# Patient Record
Sex: Male | Born: 1950 | Race: White | Hispanic: No | Marital: Married | Smoking: Former smoker
Health system: Southern US, Community
[De-identification: ages and names within clinical notes are randomized; demographics above are authoritative.]

## PROBLEM LIST (undated history)

## (undated) DIAGNOSIS — I714 Abdominal aortic aneurysm, without rupture, unspecified: Secondary | ICD-10-CM

## (undated) DIAGNOSIS — I10 Essential (primary) hypertension: Secondary | ICD-10-CM

## (undated) DIAGNOSIS — E119 Type 2 diabetes mellitus without complications: Secondary | ICD-10-CM

## (undated) DIAGNOSIS — C4491 Basal cell carcinoma of skin, unspecified: Secondary | ICD-10-CM

## (undated) DIAGNOSIS — D473 Essential (hemorrhagic) thrombocythemia: Secondary | ICD-10-CM

## (undated) HISTORY — PX: CATARACT EXTRACTION, BILATERAL: SHX1313

## (undated) HISTORY — DX: Abdominal aortic aneurysm, without rupture, unspecified: I71.40

## (undated) HISTORY — DX: Essential (hemorrhagic) thrombocythemia: D47.3

## (undated) HISTORY — PX: TONSILLECTOMY: SUR1361

## (undated) HISTORY — DX: Type 2 diabetes mellitus without complications: E11.9

## (undated) HISTORY — DX: Essential (primary) hypertension: I10

---

## 1998-06-19 ENCOUNTER — Ambulatory Visit (HOSPITAL_COMMUNITY): Admission: RE | Admit: 1998-06-19 | Discharge: 1998-06-19 | Payer: Self-pay | Admitting: Cardiology

## 1998-07-05 ENCOUNTER — Ambulatory Visit (HOSPITAL_COMMUNITY): Admission: RE | Admit: 1998-07-05 | Discharge: 1998-07-05 | Payer: Self-pay | Admitting: Pulmonary Disease

## 2001-12-30 DIAGNOSIS — C189 Malignant neoplasm of colon, unspecified: Secondary | ICD-10-CM

## 2001-12-30 HISTORY — DX: Malignant neoplasm of colon, unspecified: C18.9

## 2002-06-23 HISTORY — PX: COLON SURGERY: SHX602

## 2012-06-03 ENCOUNTER — Encounter (INDEPENDENT_AMBULATORY_CARE_PROVIDER_SITE_OTHER): Payer: Self-pay | Admitting: Hematology and Oncology

## 2012-06-03 DIAGNOSIS — C189 Malignant neoplasm of colon, unspecified: Secondary | ICD-10-CM

## 2012-06-03 DIAGNOSIS — M549 Dorsalgia, unspecified: Secondary | ICD-10-CM

## 2012-06-03 DIAGNOSIS — D473 Essential (hemorrhagic) thrombocythemia: Secondary | ICD-10-CM

## 2012-06-03 DIAGNOSIS — E119 Type 2 diabetes mellitus without complications: Secondary | ICD-10-CM

## 2012-06-25 ENCOUNTER — Encounter (INDEPENDENT_AMBULATORY_CARE_PROVIDER_SITE_OTHER): Payer: Self-pay | Admitting: Hematology and Oncology

## 2012-06-25 DIAGNOSIS — C189 Malignant neoplasm of colon, unspecified: Secondary | ICD-10-CM

## 2012-06-25 DIAGNOSIS — D473 Essential (hemorrhagic) thrombocythemia: Secondary | ICD-10-CM

## 2012-06-25 DIAGNOSIS — E119 Type 2 diabetes mellitus without complications: Secondary | ICD-10-CM

## 2012-06-25 DIAGNOSIS — M129 Arthropathy, unspecified: Secondary | ICD-10-CM

## 2017-09-29 ENCOUNTER — Encounter: Payer: Self-pay | Admitting: Adult Health

## 2017-10-01 ENCOUNTER — Encounter: Payer: Self-pay | Admitting: Adult Health

## 2017-10-06 ENCOUNTER — Encounter: Payer: Self-pay | Admitting: Internal Medicine

## 2017-10-17 ENCOUNTER — Encounter: Payer: Self-pay | Admitting: Adult Health

## 2017-11-10 ENCOUNTER — Encounter: Payer: Self-pay | Admitting: Adult Health

## 2017-11-13 ENCOUNTER — Ambulatory Visit: Payer: Self-pay | Admitting: Nurse Practitioner

## 2017-11-19 ENCOUNTER — Encounter: Payer: Self-pay | Admitting: Adult Health

## 2017-12-10 ENCOUNTER — Encounter: Payer: Self-pay | Admitting: Adult Health

## 2018-01-01 ENCOUNTER — Ambulatory Visit: Payer: Self-pay | Admitting: Nurse Practitioner

## 2018-01-14 DIAGNOSIS — I10 Essential (primary) hypertension: Secondary | ICD-10-CM | POA: Diagnosis not present

## 2018-01-14 DIAGNOSIS — Z85038 Personal history of other malignant neoplasm of large intestine: Secondary | ICD-10-CM | POA: Diagnosis not present

## 2018-01-14 DIAGNOSIS — E119 Type 2 diabetes mellitus without complications: Secondary | ICD-10-CM | POA: Diagnosis not present

## 2018-01-14 DIAGNOSIS — R1013 Epigastric pain: Secondary | ICD-10-CM | POA: Diagnosis not present

## 2018-01-14 DIAGNOSIS — D473 Essential (hemorrhagic) thrombocythemia: Secondary | ICD-10-CM | POA: Diagnosis not present

## 2018-01-14 DIAGNOSIS — Z7982 Long term (current) use of aspirin: Secondary | ICD-10-CM | POA: Diagnosis not present

## 2018-01-14 DIAGNOSIS — M109 Gout, unspecified: Secondary | ICD-10-CM | POA: Diagnosis not present

## 2018-01-14 DIAGNOSIS — R5383 Other fatigue: Secondary | ICD-10-CM | POA: Diagnosis not present

## 2018-01-14 DIAGNOSIS — R110 Nausea: Secondary | ICD-10-CM | POA: Diagnosis not present

## 2018-01-14 DIAGNOSIS — Z7984 Long term (current) use of oral hypoglycemic drugs: Secondary | ICD-10-CM | POA: Diagnosis not present

## 2018-01-14 DIAGNOSIS — Z85820 Personal history of malignant melanoma of skin: Secondary | ICD-10-CM | POA: Diagnosis not present

## 2018-01-14 DIAGNOSIS — Z87891 Personal history of nicotine dependence: Secondary | ICD-10-CM | POA: Diagnosis not present

## 2018-01-14 DIAGNOSIS — Z86718 Personal history of other venous thrombosis and embolism: Secondary | ICD-10-CM | POA: Diagnosis not present

## 2018-01-14 DIAGNOSIS — Z9221 Personal history of antineoplastic chemotherapy: Secondary | ICD-10-CM | POA: Diagnosis not present

## 2018-01-14 DIAGNOSIS — Z79899 Other long term (current) drug therapy: Secondary | ICD-10-CM | POA: Diagnosis not present

## 2018-01-14 DIAGNOSIS — T433X5A Adverse effect of phenothiazine antipsychotics and neuroleptics, initial encounter: Secondary | ICD-10-CM | POA: Diagnosis not present

## 2018-01-14 DIAGNOSIS — Z79891 Long term (current) use of opiate analgesic: Secondary | ICD-10-CM | POA: Diagnosis not present

## 2018-01-14 DIAGNOSIS — E875 Hyperkalemia: Secondary | ICD-10-CM | POA: Diagnosis not present

## 2018-01-27 DIAGNOSIS — H524 Presbyopia: Secondary | ICD-10-CM | POA: Diagnosis not present

## 2018-01-27 DIAGNOSIS — H40013 Open angle with borderline findings, low risk, bilateral: Secondary | ICD-10-CM | POA: Diagnosis not present

## 2018-01-27 DIAGNOSIS — H2513 Age-related nuclear cataract, bilateral: Secondary | ICD-10-CM | POA: Diagnosis not present

## 2018-01-28 DIAGNOSIS — E119 Type 2 diabetes mellitus without complications: Secondary | ICD-10-CM | POA: Diagnosis not present

## 2018-01-28 DIAGNOSIS — R110 Nausea: Secondary | ICD-10-CM | POA: Diagnosis not present

## 2018-01-28 DIAGNOSIS — T433X5A Adverse effect of phenothiazine antipsychotics and neuroleptics, initial encounter: Secondary | ICD-10-CM | POA: Diagnosis not present

## 2018-01-28 DIAGNOSIS — I10 Essential (primary) hypertension: Secondary | ICD-10-CM | POA: Diagnosis not present

## 2018-01-28 DIAGNOSIS — R1013 Epigastric pain: Secondary | ICD-10-CM | POA: Diagnosis not present

## 2018-01-28 DIAGNOSIS — D473 Essential (hemorrhagic) thrombocythemia: Secondary | ICD-10-CM | POA: Diagnosis not present

## 2018-02-25 DIAGNOSIS — Z85038 Personal history of other malignant neoplasm of large intestine: Secondary | ICD-10-CM | POA: Diagnosis not present

## 2018-02-25 DIAGNOSIS — Z7984 Long term (current) use of oral hypoglycemic drugs: Secondary | ICD-10-CM | POA: Diagnosis not present

## 2018-02-25 DIAGNOSIS — M109 Gout, unspecified: Secondary | ICD-10-CM | POA: Diagnosis not present

## 2018-02-25 DIAGNOSIS — Z86718 Personal history of other venous thrombosis and embolism: Secondary | ICD-10-CM | POA: Diagnosis not present

## 2018-02-25 DIAGNOSIS — Z9221 Personal history of antineoplastic chemotherapy: Secondary | ICD-10-CM | POA: Diagnosis not present

## 2018-02-25 DIAGNOSIS — Z91041 Radiographic dye allergy status: Secondary | ICD-10-CM | POA: Diagnosis not present

## 2018-02-25 DIAGNOSIS — Z79899 Other long term (current) drug therapy: Secondary | ICD-10-CM | POA: Diagnosis not present

## 2018-02-25 DIAGNOSIS — E119 Type 2 diabetes mellitus without complications: Secondary | ICD-10-CM | POA: Diagnosis not present

## 2018-02-25 DIAGNOSIS — D473 Essential (hemorrhagic) thrombocythemia: Secondary | ICD-10-CM | POA: Diagnosis not present

## 2018-02-25 DIAGNOSIS — E785 Hyperlipidemia, unspecified: Secondary | ICD-10-CM | POA: Diagnosis not present

## 2018-02-25 DIAGNOSIS — Z7982 Long term (current) use of aspirin: Secondary | ICD-10-CM | POA: Diagnosis not present

## 2018-02-25 DIAGNOSIS — I10 Essential (primary) hypertension: Secondary | ICD-10-CM | POA: Diagnosis not present

## 2018-02-25 DIAGNOSIS — Z79891 Long term (current) use of opiate analgesic: Secondary | ICD-10-CM | POA: Diagnosis not present

## 2018-02-26 DIAGNOSIS — H40013 Open angle with borderline findings, low risk, bilateral: Secondary | ICD-10-CM | POA: Diagnosis not present

## 2018-03-05 ENCOUNTER — Other Ambulatory Visit (HOSPITAL_COMMUNITY): Payer: Self-pay | Admitting: *Deleted

## 2018-03-05 DIAGNOSIS — D473 Essential (hemorrhagic) thrombocythemia: Secondary | ICD-10-CM

## 2018-03-10 DIAGNOSIS — M545 Low back pain: Secondary | ICD-10-CM | POA: Diagnosis not present

## 2018-03-10 DIAGNOSIS — E782 Mixed hyperlipidemia: Secondary | ICD-10-CM | POA: Diagnosis not present

## 2018-03-10 DIAGNOSIS — E119 Type 2 diabetes mellitus without complications: Secondary | ICD-10-CM | POA: Diagnosis not present

## 2018-03-10 DIAGNOSIS — N189 Chronic kidney disease, unspecified: Secondary | ICD-10-CM | POA: Diagnosis not present

## 2018-03-10 DIAGNOSIS — D509 Iron deficiency anemia, unspecified: Secondary | ICD-10-CM | POA: Diagnosis not present

## 2018-03-10 DIAGNOSIS — R161 Splenomegaly, not elsewhere classified: Secondary | ICD-10-CM | POA: Diagnosis not present

## 2018-03-10 DIAGNOSIS — I10 Essential (primary) hypertension: Secondary | ICD-10-CM | POA: Diagnosis not present

## 2018-03-10 DIAGNOSIS — D519 Vitamin B12 deficiency anemia, unspecified: Secondary | ICD-10-CM | POA: Diagnosis not present

## 2018-03-10 DIAGNOSIS — R739 Hyperglycemia, unspecified: Secondary | ICD-10-CM | POA: Diagnosis not present

## 2018-03-10 DIAGNOSIS — E7800 Pure hypercholesterolemia, unspecified: Secondary | ICD-10-CM | POA: Diagnosis not present

## 2018-03-10 DIAGNOSIS — M109 Gout, unspecified: Secondary | ICD-10-CM | POA: Diagnosis not present

## 2018-03-10 DIAGNOSIS — E559 Vitamin D deficiency, unspecified: Secondary | ICD-10-CM | POA: Diagnosis not present

## 2018-03-10 DIAGNOSIS — D529 Folate deficiency anemia, unspecified: Secondary | ICD-10-CM | POA: Diagnosis not present

## 2018-03-12 DIAGNOSIS — E1142 Type 2 diabetes mellitus with diabetic polyneuropathy: Secondary | ICD-10-CM | POA: Diagnosis not present

## 2018-03-12 DIAGNOSIS — Z6838 Body mass index (BMI) 38.0-38.9, adult: Secondary | ICD-10-CM | POA: Diagnosis not present

## 2018-03-12 DIAGNOSIS — G6289 Other specified polyneuropathies: Secondary | ICD-10-CM | POA: Diagnosis not present

## 2018-03-12 DIAGNOSIS — E782 Mixed hyperlipidemia: Secondary | ICD-10-CM | POA: Diagnosis not present

## 2018-03-12 DIAGNOSIS — D473 Essential (hemorrhagic) thrombocythemia: Secondary | ICD-10-CM | POA: Diagnosis not present

## 2018-03-12 DIAGNOSIS — I10 Essential (primary) hypertension: Secondary | ICD-10-CM | POA: Diagnosis not present

## 2018-04-08 ENCOUNTER — Encounter (HOSPITAL_COMMUNITY): Payer: Self-pay | Admitting: Hematology

## 2018-04-08 ENCOUNTER — Inpatient Hospital Stay (HOSPITAL_COMMUNITY): Payer: Medicare Other | Attending: Hematology | Admitting: Hematology

## 2018-04-08 ENCOUNTER — Inpatient Hospital Stay (HOSPITAL_COMMUNITY): Payer: Medicare Other | Attending: Hematology

## 2018-04-08 ENCOUNTER — Other Ambulatory Visit: Payer: Self-pay

## 2018-04-08 DIAGNOSIS — Z801 Family history of malignant neoplasm of trachea, bronchus and lung: Secondary | ICD-10-CM

## 2018-04-08 DIAGNOSIS — D473 Essential (hemorrhagic) thrombocythemia: Secondary | ICD-10-CM | POA: Diagnosis not present

## 2018-04-08 DIAGNOSIS — Z7984 Long term (current) use of oral hypoglycemic drugs: Secondary | ICD-10-CM | POA: Insufficient documentation

## 2018-04-08 DIAGNOSIS — Z7982 Long term (current) use of aspirin: Secondary | ICD-10-CM | POA: Insufficient documentation

## 2018-04-08 DIAGNOSIS — I10 Essential (primary) hypertension: Secondary | ICD-10-CM

## 2018-04-08 DIAGNOSIS — Z85038 Personal history of other malignant neoplasm of large intestine: Secondary | ICD-10-CM | POA: Diagnosis not present

## 2018-04-08 DIAGNOSIS — E119 Type 2 diabetes mellitus without complications: Secondary | ICD-10-CM

## 2018-04-08 DIAGNOSIS — Z86718 Personal history of other venous thrombosis and embolism: Secondary | ICD-10-CM | POA: Diagnosis not present

## 2018-04-08 LAB — CBC WITH DIFFERENTIAL/PLATELET
BASOS ABS: 0.1 10*3/uL (ref 0.0–0.1)
BASOS PCT: 1 %
EOS PCT: 1 %
Eosinophils Absolute: 0.1 10*3/uL (ref 0.0–0.7)
HEMATOCRIT: 38.1 % — AB (ref 39.0–52.0)
HEMOGLOBIN: 12.5 g/dL — AB (ref 13.0–17.0)
Lymphocytes Relative: 25 %
Lymphs Abs: 1.2 10*3/uL (ref 0.7–4.0)
MCH: 32.8 pg (ref 26.0–34.0)
MCHC: 32.8 g/dL (ref 30.0–36.0)
MCV: 100 fL (ref 78.0–100.0)
MONO ABS: 0.4 10*3/uL (ref 0.1–1.0)
MONOS PCT: 8 %
Neutro Abs: 3.1 10*3/uL (ref 1.7–7.7)
Neutrophils Relative %: 65 %
Platelets: 359 10*3/uL (ref 150–400)
RBC: 3.81 MIL/uL — ABNORMAL LOW (ref 4.22–5.81)
RDW: 19 % — ABNORMAL HIGH (ref 11.5–15.5)
WBC: 4.8 10*3/uL (ref 4.0–10.5)

## 2018-04-08 LAB — COMPREHENSIVE METABOLIC PANEL
ALT: 35 U/L (ref 17–63)
AST: 22 U/L (ref 15–41)
Albumin: 4.6 g/dL (ref 3.5–5.0)
Alkaline Phosphatase: 38 U/L (ref 38–126)
Anion gap: 12 (ref 5–15)
BUN: 16 mg/dL (ref 6–20)
CHLORIDE: 104 mmol/L (ref 101–111)
CO2: 21 mmol/L — AB (ref 22–32)
CREATININE: 1.21 mg/dL (ref 0.61–1.24)
Calcium: 9.4 mg/dL (ref 8.9–10.3)
GFR calc Af Amer: >60 mL/min (ref >60)
GFR, EST NON AFRICAN AMERICAN: 60 mL/min — AB (ref >60)
Glucose, Bld: 148 mg/dL — ABNORMAL HIGH (ref 65–99)
Potassium: 3.9 mmol/L (ref 3.5–5.1)
Sodium: 137 mmol/L (ref 135–145)
Total Bilirubin: 0.9 mg/dL (ref 0.3–1.2)
Total Protein: 7.1 g/dL (ref 6.5–8.1)

## 2018-04-08 NOTE — Assessment & Plan Note (Signed)
1.  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  He does not have any vasomotor symptoms.  We started him on hydroxyurea in December 2018.  He is also taking baby aspirin daily.  He is complaining of occasional lightheadedness since we started hydroxyurea.  His platelet count today has come down to 359.  His white count and hemoglobin are stable.  Hence I have recommended cutting down hydroxyurea to 2 tablets daily.  He was taking 3 tablets on Monday and Friday and 2 tablets rest of the week.  I will see him back in 2 months for follow-up and repeat labs.  He does not report any left upper quadrant pain since we started hydroxyurea.  2.  Diabetes: His Metformin ER has been increased to 1000mg  daily.  He is having slight diarrhea from it.  3.  Hypertension: This is well controlled on Toprol-XL.

## 2018-04-08 NOTE — Progress Notes (Signed)
CONSULT NOTE  Patient Care Team: Caryl Bis, MD as PCP - General (Family Medicine) Gala Romney Cristopher Estimable, MD as Consulting Physician (Gastroenterology)  CHIEF COMPLAINTS/PURPOSE OF CONSULTATION:  Essential thrombocytosis.  HISTORY OF PRESENTING ILLNESS:  Justin Berger 67 y.o. male is seen in consultation today for management of essential thrombocytosis.  He was having vague left upper quadrant abdominal pain for which a CT scan in October 2018 showed splenomegaly measuring 15 cm in craniocaudal dimension.  He was referred for further evaluation of splenomegaly.  On CBC he was found to have elevated platelet count of 576.  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  He had a bone marrow biopsy done on 12/02/2017.  He had a history of left leg DVT in the past, but did not have any vasomotor symptoms.  He was started on hydroxyurea 1 tablet daily on 12/12/2017 and increased to 2 tablets daily on 12/25/2017.  He is currently taking 3 tablets on Monday and Friday and 2 tablets the rest of the week.  He complains of fatigue on the days when he takes 3 of them.  He also has occasional lightheadedness.  The lightheadedness has been present since he started taking hydroxyurea.  He denies any fevers, night sweats or weight loss.  He denies any hospitalizations or recent infections.  He denies any mucositis.  He denies any nausea associated with it.  He also has had a history of colon cancer, status post resection in September 2012, status post adjuvant chemotherapy with 6 months of 5-FU and leucovorin under the direction of Dr. Sonny Dandy at [place]:  Gout, diabetes, hypertension, high cholesterol, colon cancer as described above, DVT, melanoma SURGICAL HISTORY: History reviewed. No pertinent surgical history.  SOCIAL HISTORY: Social History   Socioeconomic History  . Marital status: Married    Spouse name: Not on file  . Number of children: Not on file   . Years of education: Not on file  . Highest education level: Not on file  Occupational History  . Not on file  Social Needs  . Financial resource strain: Not on file  . Food insecurity:    Worry: Not on file    Inability: Not on file  . Transportation needs:    Medical: Not on file    Non-medical: Not on file  Tobacco Use  . Smoking status: Former Smoker    Packs/day: 0.50    Years: 3.00    Pack years: 1.50    Types: Cigarettes    Last attempt to quit: 04/08/1978    Years since quitting: 40.0  . Smokeless tobacco: Never Used  Substance and Sexual Activity  . Alcohol use: Never    Frequency: Never  . Drug use: Never  . Sexual activity: Not on file  Lifestyle  . Physical activity:    Days per week: Not on file    Minutes per session: Not on file  . Stress: Not on file  Relationships  . Social connections:    Talks on phone: Not on file    Gets together: Not on file    Attends religious service: Not on file    Active member of club or organization: Not on file    Attends meetings of clubs or organizations: Not on file    Relationship status: Not on file  . Intimate partner violence:    Fear of current or ex partner: Not on file    Emotionally abused: Not on  file    Physically abused: Not on file    Forced sexual activity: Not on file  Other Topics Concern  . Not on file  Social History Narrative  . Not on file    FAMILY HISTORY: Family History  Problem Relation Age of Onset  . Dementia Mother   . Cancer Paternal Uncle        lung    ALLERGIES:  is allergic to ivp dye  [iodinated diagnostic agents].  MEDICATIONS:  Current Outpatient Medications  Medication Sig Dispense Refill  . ACCU-CHEK AVIVA PLUS test strip TEST BLOOD SUGAR D  3  . cyclobenzaprine (FLEXERIL) 10 MG tablet Take by mouth.    . hydroxyurea (HYDREA) 500 MG capsule TK 2 CS PO D  3  . metFORMIN (GLUCOPHAGE-XR) 500 MG 24 hr tablet TK 1 T PO QD  6  . metoprolol succinate (TOPROL-XL) 50 MG 24  hr tablet TK 1 T PO QD  3  . promethazine (PHENERGAN) 12.5 MG tablet TAKE 1-2 TABLETS PO BID PRN  0   No current facility-administered medications for this visit.     REVIEW OF SYSTEMS:   Constitutional: Denies fevers, chills or abnormal night sweats.  Occasional fatigue. Eyes: Denies blurriness of vision, double vision or watery eyes Ears, nose, mouth, throat, and face: Denies mucositis or sore throat Respiratory: Denies cough, dyspnea or wheezes Cardiovascular: Denies palpitation, chest discomfort or lower extremity swelling.  Occasional lightheadedness. Gastrointestinal:  Denies nausea, heartburn or change in bowel habits Skin: Denies abnormal skin rashes Lymphatics: Denies new lymphadenopathy or easy bruising Neurological:Denies numbness, tingling or new weaknesses Behavioral/Psych: Mood is stable, no new changes  All other systems were reviewed with the patient and are negative.  PHYSICAL EXAMINATION: ECOG PERFORMANCE STATUS: 1 - Symptomatic but completely ambulatory  Vitals:   04/08/18 1312  BP: (!) 155/83  Pulse: 71  Resp: 16  SpO2: 98%   Filed Weights   04/08/18 1312  Weight: 259 lb (117.5 kg)    GENERAL:alert, no distress and comfortable SKIN: skin color, texture, turgor are normal, no rashes or significant lesions EYES: normal, conjunctiva are pink and non-injected, sclera clear OROPHARYNX:no exudate, no erythema and lips, buccal mucosa, and tongue normal  NECK: supple, thyroid normal size, non-tender, without nodularity LYMPH:  no palpable lymphadenopathy in the cervical, axillary or inguinal LUNGS: clear to auscultation and percussion with normal breathing effort HEART: regular rate & rhythm and no murmurs and no lower extremity edema ABDOMEN:abdomen soft, non-tender and normal bowel sounds Musculoskeletal:no cyanosis of digits and no clubbing  PSYCH: alert & oriented x 3 with fluent speech NEURO: no focal motor/sensory deficits  LABORATORY DATA:  I have  reviewed the data as listed Recent Results (from the past 2160 hour(s))  CBC with Differential     Status: Abnormal   Collection Time: 04/08/18 12:15 PM  Result Value Ref Range   WBC 4.8 4.0 - 10.5 K/uL   RBC 3.81 (L) 4.22 - 5.81 MIL/uL   Hemoglobin 12.5 (L) 13.0 - 17.0 g/dL   HCT 38.1 (L) 39.0 - 52.0 %   MCV 100.0 78.0 - 100.0 fL   MCH 32.8 26.0 - 34.0 pg   MCHC 32.8 30.0 - 36.0 g/dL   RDW 19.0 (H) 11.5 - 15.5 %   Platelets 359 150 - 400 K/uL   Neutrophils Relative % 65 %   Neutro Abs 3.1 1.7 - 7.7 K/uL   Lymphocytes Relative 25 %   Lymphs Abs 1.2 0.7 - 4.0 K/uL  Monocytes Relative 8 %   Monocytes Absolute 0.4 0.1 - 1.0 K/uL   Eosinophils Relative 1 %   Eosinophils Absolute 0.1 0.0 - 0.7 K/uL   Basophils Relative 1 %   Basophils Absolute 0.1 0.0 - 0.1 K/uL   WBC Morphology ATYPICAL LYMPHOCYTES     Comment: Performed at [facility], [address], [place], [place] [address]  Comprehensive metabolic panel     Status: Abnormal   Collection Time: 04/08/18 12:15 PM  Result Value Ref Range   Sodium 137 135 - 145 mmol/L   Potassium 3.9 3.5 - 5.1 mmol/L   Chloride 104 101 - 111 mmol/L   CO2 21 (L) 22 - 32 mmol/L   Glucose, Bld 148 (H) 65 - 99 mg/dL   BUN 16 6 - 20 mg/dL   Creatinine, Ser 1.21 0.61 - 1.24 mg/dL   Calcium 9.4 8.9 - 10.3 mg/dL   Total Protein 7.1 6.5 - 8.1 g/dL   Albumin 4.6 3.5 - 5.0 g/dL   AST 22 15 - 41 U/L   ALT 35 17 - 63 U/L   Alkaline Phosphatase 38 38 - 126 U/L   Total Bilirubin 0.9 0.3 - 1.2 mg/dL   GFR calc non Af Amer 60 (L) >60 mL/min   GFR calc Af Amer >60 >60 mL/min    Comment: (NOTE) The eGFR has been calculated using the CKD EPI equation. This calculation has not been validated in all clinical situations. eGFR's persistently <60 mL/min signify possible Chronic Kidney Disease.    Anion gap 12 5 - 15    Comment: Performed at [facility], [address], [place], [place] [address]     ASSESSMENT & PLAN:  Essential thrombocytosis ([place]) 1.   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  He does not have any vasomotor symptoms.  We started him on hydroxyurea in December 2018.  He is also taking baby aspirin daily.  He is complaining of occasional lightheadedness since we started hydroxyurea.  His platelet count today has come down to 359.  His white count and hemoglobin are stable.  Hence I have recommended cutting down hydroxyurea to 2 tablets daily.  He was taking 3 tablets on Monday and Friday and 2 tablets rest of the week.  I will see him back in 2 months for follow-up and repeat labs.  He does not report any left upper quadrant pain since we started hydroxyurea.  2.  Diabetes: His Metformin ER has been increased to 1092m daily.  He is having slight diarrhea from it.  3.  Hypertension: This is well controlled on Toprol-XL.       SDerek Jack MD 04/08/18 1:37 PM

## 2018-06-09 ENCOUNTER — Encounter (HOSPITAL_COMMUNITY): Payer: Self-pay | Admitting: Hematology

## 2018-06-09 ENCOUNTER — Other Ambulatory Visit: Payer: Self-pay

## 2018-06-09 ENCOUNTER — Inpatient Hospital Stay (HOSPITAL_COMMUNITY): Payer: Medicare Other

## 2018-06-09 ENCOUNTER — Inpatient Hospital Stay (HOSPITAL_COMMUNITY): Payer: Medicare Other | Attending: Hematology | Admitting: Hematology

## 2018-06-09 DIAGNOSIS — I10 Essential (primary) hypertension: Secondary | ICD-10-CM

## 2018-06-09 DIAGNOSIS — D473 Essential (hemorrhagic) thrombocythemia: Secondary | ICD-10-CM

## 2018-06-09 DIAGNOSIS — E119 Type 2 diabetes mellitus without complications: Secondary | ICD-10-CM

## 2018-06-09 DIAGNOSIS — Z87891 Personal history of nicotine dependence: Secondary | ICD-10-CM | POA: Insufficient documentation

## 2018-06-09 DIAGNOSIS — L819 Disorder of pigmentation, unspecified: Secondary | ICD-10-CM | POA: Diagnosis not present

## 2018-06-09 LAB — COMPREHENSIVE METABOLIC PANEL
ALT: 30 U/L (ref 17–63)
AST: 21 U/L (ref 15–41)
Albumin: 4.7 g/dL (ref 3.5–5.0)
Alkaline Phosphatase: 38 U/L (ref 38–126)
Anion gap: 10 (ref 5–15)
BUN: 18 mg/dL (ref 6–20)
CHLORIDE: 106 mmol/L (ref 101–111)
CO2: 24 mmol/L (ref 22–32)
CREATININE: 1.34 mg/dL — AB (ref 0.61–1.24)
Calcium: 9.4 mg/dL (ref 8.9–10.3)
GFR calc non Af Amer: 53 mL/min — ABNORMAL LOW (ref >60)
Glucose, Bld: 128 mg/dL — ABNORMAL HIGH (ref 65–99)
Potassium: 3.8 mmol/L (ref 3.5–5.1)
Sodium: 140 mmol/L (ref 135–145)
Total Bilirubin: 1.1 mg/dL (ref 0.3–1.2)
Total Protein: 7.2 g/dL (ref 6.5–8.1)

## 2018-06-09 LAB — CBC WITH DIFFERENTIAL/PLATELET
BASOS PCT: 2 %
Basophils Absolute: 0.1 10*3/uL (ref 0.0–0.1)
EOS PCT: 1 %
Eosinophils Absolute: 0.1 10*3/uL (ref 0.0–0.7)
HCT: 40.3 % (ref 39.0–52.0)
Hemoglobin: 12.8 g/dL — ABNORMAL LOW (ref 13.0–17.0)
LYMPHS ABS: 1.4 10*3/uL (ref 0.7–4.0)
Lymphocytes Relative: 26 %
MCH: 32.7 pg (ref 26.0–34.0)
MCHC: 31.8 g/dL (ref 30.0–36.0)
MCV: 103.1 fL — AB (ref 78.0–100.0)
MONO ABS: 0.4 10*3/uL (ref 0.1–1.0)
Monocytes Relative: 8 %
NEUTROS ABS: 3.2 10*3/uL (ref 1.7–7.7)
Neutrophils Relative %: 63 %
PLATELETS: 376 10*3/uL (ref 150–400)
RBC: 3.91 MIL/uL — ABNORMAL LOW (ref 4.22–5.81)
RDW: 16.1 % — AB (ref 11.5–15.5)
WBC: 5.2 10*3/uL (ref 4.0–10.5)

## 2018-06-09 LAB — LACTATE DEHYDROGENASE: LDH: 180 U/L (ref 98–192)

## 2018-06-09 NOTE — Progress Notes (Signed)
[place], [place] [address]   CLINIC:  Medical Oncology/Hematology  PCP:  Caryl Bis, MD [place] [address] [phone]   REASON FOR VISIT:  Follow-up for essential thrombocytosis.  CURRENT THERAPY: Hydroxyurea 2 tablets daily.  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   INTERVAL HISTORY:  Justin Berger 67 y.o. male returns for follow-up of essential thrombocytosis.  At last visit 2 months ago, we have cut back on dose of hydroxyurea to 2 tablets daily as he was complaining of feeling lightheaded.  Today he reports that it is not lightheadedness but he feels off balance when he walks.  It does not happen often.  It did not get better after we cut back on hydroxyurea dose.  His blood pressure was 741 systolic today.  He denies any mucositis.  He reports a hyperpigmented area at the right corner of the mouth which has been there for more than 6  months.  It does not bleed or itch.  He is concerned about it.  Numbness in the feet has been stable.  Occasional diarrhea is also stable.  REVIEW OF SYSTEMS:  Review of Systems  Constitutional: Positive for fatigue.  Gastrointestinal: Positive for diarrhea.  Neurological: Positive for numbness.  All other systems reviewed and are negative.    PAST MEDICAL/SURGICAL HISTORY:  Past Medical History:  Diagnosis Date  . Diabetes ([place])   . Hypertension    History reviewed. No pertinent surgical history.   SOCIAL HISTORY:  Social History   Socioeconomic History  . Marital status: Married    Spouse name: Not on file  . Number of children: Not on file  . Years of education: Not on file  . Highest education level: Not on file  Occupational History  . Not on file  Social Needs  . Financial resource strain: Not on file  . Food insecurity:    Worry: Not on file    Inability: Not on file  . Transportation needs:    Medical: Not on file    Non-medical: Not on file  Tobacco Use  . Smoking status: Former Smoker    Packs/day: 0.50    Years: 3.00    Pack years: 1.50    Types: Cigarettes    Last attempt to quit: 04/08/1978    Years since quitting: 40.1  . Smokeless tobacco: Never Used  Substance and Sexual Activity  . Alcohol use: Never  Frequency: Never  . Drug use: Never  . Sexual activity: Not on file  Lifestyle  . Physical activity:    Days per week: Not on file    Minutes per session: Not on file  . Stress: Not on file  Relationships  . Social connections:    Talks on phone: Not on file    Gets together: Not on file    Attends religious service: Not on file    Active member of club or organization: Not on file    Attends meetings of clubs or organizations: Not on file    Relationship status: Not on file  . Intimate partner violence:    Fear of current or ex partner: Not on file    Emotionally abused: Not on file    Physically abused: Not on file    Forced  sexual activity: Not on file  Other Topics Concern  . Not on file  Social History Narrative  . Not on file    FAMILY HISTORY:  Family History  Problem Relation Age of Onset  . Dementia Mother   . Cancer Paternal Uncle        lung    CURRENT MEDICATIONS:  Outpatient Encounter Medications as of 06/09/2018  Medication Sig  . ACCU-CHEK AVIVA PLUS test strip TEST BLOOD SUGAR D  . cyclobenzaprine (FLEXERIL) 10 MG tablet Take by mouth.  . hydroxyurea (HYDREA) 500 MG capsule TK 2 CS PO D  . metFORMIN (GLUCOPHAGE-XR) 500 MG 24 hr tablet TK 1 T PO QD  . metoprolol succinate (TOPROL-XL) 50 MG 24 hr tablet TK 1 T PO QD  . promethazine (PHENERGAN) 12.5 MG tablet TAKE 1-2 TABLETS PO BID PRN   No facility-administered encounter medications on file as of 06/09/2018.     ALLERGIES:  Allergies  Allergen Reactions  . Ivp Dye  [Iodinated Diagnostic Agents]      PHYSICAL EXAM:  ECOG Performance status: 1  Vitals:   06/09/18 1440  BP: 138/88  Pulse: 85  Resp: 18  Temp: 98.4 F (36.9 C)  SpO2: 98%   Filed Weights   06/09/18 1440  Weight: 250 lb 3.2 oz (113.5 kg)    Physical Exam Focused physical examination did not reveal any palpable adenopathy or splenomegaly.  The lesion at the angle of the mouth is slightly elevated and hyperpigmented.  LABORATORY DATA:  I have reviewed the labs as listed.  CBC    Component Value Date/Time   WBC 5.2 06/09/2018 1347   RBC 3.91 (L) 06/09/2018 1347   HGB 12.8 (L) 06/09/2018 1347   HCT 40.3 06/09/2018 1347   PLT 376 06/09/2018 1347   MCV 103.1 (H) 06/09/2018 1347   MCH 32.7 06/09/2018 1347   MCHC 31.8 06/09/2018 1347   RDW 16.1 (H) 06/09/2018 1347   LYMPHSABS 1.4 06/09/2018 1347   MONOABS 0.4 06/09/2018 1347   EOSABS 0.1 06/09/2018 1347   BASOSABS 0.1 06/09/2018 1347   CMP Latest Ref Rng & Units 06/09/2018 04/08/2018  Glucose 65 - 99 mg/dL 128(H) 148(H)  BUN 6 - 20 mg/dL 18 16  Creatinine 0.61 - 1.24 mg/dL 1.34(H) 1.21  Sodium 135 -  145 mmol/L 140 137  Potassium 3.5 - 5.1 mmol/L 3.8 3.9  Chloride 101 - 111 mmol/L 106 104  CO2 22 - 32 mmol/L 24 21(L)  Calcium 8.9 - 10.3 mg/dL 9.4 9.4  Total Protein 6.5 - 8.1 g/dL 7.2 7.1  Total Bilirubin 0.3 - 1.2 mg/dL 1.1 0.9  Alkaline Phos 38 -  126 U/L 38 38  AST 15 - 41 U/L 21 22  ALT 17 - 63 U/L 30 35       ASSESSMENT & PLAN:   Essential thrombocytosis ([facility]) 1.  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  He does not have any vasomotor symptoms.  We started him on hydroxyurea in December 2018.  He is also taking baby aspirin daily.  He is complaining of occasional lightheadedness since we started hydroxyurea.  I have cut back on hydroxyurea to 2 tablets daily at last visit.  He denies any lightheadedness but reports as feeling off balance when he walks sometimes.  Today his platelets are 376 and in the desired range.  Hematocrit is 40.  He is tolerating hydroxyurea very well.  We will continue at the same dose.  He will see [place] back in 3 months with repeat blood counts.  He did show me a hyperpigmented area at the right corner of the mouth.  This is slightly elevated on palpation.  He reports that it has been there for more than 6 months.  No bleeding or itching from it.  No pain.  I have recommended him to see Dr. Benjamine Mola.  He had a remote smoking history and denies any history of chewing tobacco.  2.  Diabetes: His Metformin ER has been increased to 1046m daily.  He is having slight diarrhea from it.  3.  Hypertension: This is well controlled on Toprol-XL.      Orders placed this encounter:  No orders of the defined types were placed in this encounter.     SDerek Jack MD [id number]

## 2018-06-09 NOTE — Assessment & Plan Note (Signed)
1.  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  He does not have any vasomotor symptoms.  We started him on hydroxyurea in December 2018.  He is also taking baby aspirin daily.  He is complaining of occasional lightheadedness since we started hydroxyurea.  I have cut back on hydroxyurea to 2 tablets daily at last visit.  He denies any lightheadedness but reports as feeling off balance when he walks sometimes.  Today his platelets are 376 and in the desired range.  Hematocrit is 40.  He is tolerating hydroxyurea very well.  We will continue at the same dose.  He will see [place] back in 3 months with repeat blood counts.  He did show me a hyperpigmented area at the right corner of the mouth.  This is slightly elevated on palpation.  He reports that it has been there for more than 6 months.  No bleeding or itching from it.  No pain.  I have recommended him to see Dr. Benjamine Mola.  He had a remote smoking history and denies any history of chewing tobacco.  2.  Diabetes: His Metformin ER has been increased to 1000mg  daily.  He is having slight diarrhea from it.  3.  Hypertension: This is well controlled on Toprol-XL.

## 2018-06-09 NOTE — Progress Notes (Unsigned)
Patient referred to Dr Benjamine Mola for oral lesion.  Appointment 06/11/18 @ 1PM.  Patient aware.

## 2018-06-11 ENCOUNTER — Ambulatory Visit (INDEPENDENT_AMBULATORY_CARE_PROVIDER_SITE_OTHER): Payer: Medicare Other | Admitting: Otolaryngology

## 2018-06-11 DIAGNOSIS — D3709 Neoplasm of uncertain behavior of other specified sites of the oral cavity: Secondary | ICD-10-CM

## 2018-06-11 DIAGNOSIS — H6122 Impacted cerumen, left ear: Secondary | ICD-10-CM

## 2018-07-10 DIAGNOSIS — E1142 Type 2 diabetes mellitus with diabetic polyneuropathy: Secondary | ICD-10-CM | POA: Diagnosis not present

## 2018-07-10 DIAGNOSIS — I10 Essential (primary) hypertension: Secondary | ICD-10-CM | POA: Diagnosis not present

## 2018-07-10 DIAGNOSIS — N189 Chronic kidney disease, unspecified: Secondary | ICD-10-CM | POA: Diagnosis not present

## 2018-07-10 DIAGNOSIS — C4330 Malignant melanoma of unspecified part of face: Secondary | ICD-10-CM | POA: Diagnosis not present

## 2018-07-10 DIAGNOSIS — E782 Mixed hyperlipidemia: Secondary | ICD-10-CM | POA: Diagnosis not present

## 2018-07-10 DIAGNOSIS — D473 Essential (hemorrhagic) thrombocythemia: Secondary | ICD-10-CM | POA: Diagnosis not present

## 2018-07-10 DIAGNOSIS — E7801 Familial hypercholesterolemia: Secondary | ICD-10-CM | POA: Diagnosis not present

## 2018-07-13 DIAGNOSIS — D473 Essential (hemorrhagic) thrombocythemia: Secondary | ICD-10-CM | POA: Diagnosis not present

## 2018-07-13 DIAGNOSIS — E782 Mixed hyperlipidemia: Secondary | ICD-10-CM | POA: Diagnosis not present

## 2018-07-13 DIAGNOSIS — I10 Essential (primary) hypertension: Secondary | ICD-10-CM | POA: Diagnosis not present

## 2018-07-13 DIAGNOSIS — G6289 Other specified polyneuropathies: Secondary | ICD-10-CM | POA: Diagnosis not present

## 2018-07-13 DIAGNOSIS — E1142 Type 2 diabetes mellitus with diabetic polyneuropathy: Secondary | ICD-10-CM | POA: Diagnosis not present

## 2018-07-13 DIAGNOSIS — Z6836 Body mass index (BMI) 36.0-36.9, adult: Secondary | ICD-10-CM | POA: Diagnosis not present

## 2018-08-19 DIAGNOSIS — Z23 Encounter for immunization: Secondary | ICD-10-CM | POA: Diagnosis not present

## 2018-09-01 ENCOUNTER — Other Ambulatory Visit (HOSPITAL_COMMUNITY): Payer: Self-pay

## 2018-09-01 DIAGNOSIS — D473 Essential (hemorrhagic) thrombocythemia: Secondary | ICD-10-CM

## 2018-09-09 ENCOUNTER — Inpatient Hospital Stay (HOSPITAL_COMMUNITY): Payer: Medicare Other | Attending: Hematology

## 2018-09-09 ENCOUNTER — Encounter (HOSPITAL_COMMUNITY): Payer: Self-pay | Admitting: Internal Medicine

## 2018-09-09 ENCOUNTER — Inpatient Hospital Stay (HOSPITAL_COMMUNITY): Payer: Medicare Other | Attending: Internal Medicine | Admitting: Internal Medicine

## 2018-09-09 VITALS — BP 127/89 | HR 91 | Temp 98.3°F | Resp 16 | Wt 252.2 lb

## 2018-09-09 DIAGNOSIS — E119 Type 2 diabetes mellitus without complications: Secondary | ICD-10-CM | POA: Diagnosis not present

## 2018-09-09 DIAGNOSIS — I10 Essential (primary) hypertension: Secondary | ICD-10-CM | POA: Insufficient documentation

## 2018-09-09 DIAGNOSIS — D473 Essential (hemorrhagic) thrombocythemia: Secondary | ICD-10-CM

## 2018-09-09 DIAGNOSIS — Z7984 Long term (current) use of oral hypoglycemic drugs: Secondary | ICD-10-CM | POA: Diagnosis not present

## 2018-09-09 DIAGNOSIS — Z87891 Personal history of nicotine dependence: Secondary | ICD-10-CM | POA: Diagnosis not present

## 2018-09-09 DIAGNOSIS — D7589 Other specified diseases of blood and blood-forming organs: Secondary | ICD-10-CM

## 2018-09-09 DIAGNOSIS — R42 Dizziness and giddiness: Secondary | ICD-10-CM

## 2018-09-09 LAB — CBC WITH DIFFERENTIAL/PLATELET
Basophils Absolute: 0.1 10*3/uL (ref 0.0–0.1)
Basophils Relative: 1 %
Eosinophils Absolute: 0.1 10*3/uL (ref 0.0–0.7)
Eosinophils Relative: 1 %
HCT: 39.5 % (ref 39.0–52.0)
HEMOGLOBIN: 12.8 g/dL — AB (ref 13.0–17.0)
LYMPHS PCT: 27 %
Lymphs Abs: 1.3 10*3/uL (ref 0.7–4.0)
MCH: 33.2 pg (ref 26.0–34.0)
MCHC: 32.4 g/dL (ref 30.0–36.0)
MCV: 102.6 fL — AB (ref 78.0–100.0)
Monocytes Absolute: 0.4 10*3/uL (ref 0.1–1.0)
Monocytes Relative: 8 %
NEUTROS ABS: 3.1 10*3/uL (ref 1.7–7.7)
NEUTROS PCT: 63 %
Platelets: 351 10*3/uL (ref 150–400)
RBC: 3.85 MIL/uL — AB (ref 4.22–5.81)
RDW: 16.2 % — ABNORMAL HIGH (ref 11.5–15.5)
WBC: 4.9 10*3/uL (ref 4.0–10.5)

## 2018-09-09 LAB — COMPREHENSIVE METABOLIC PANEL
ALT: 28 U/L (ref 0–44)
ANION GAP: 8 (ref 5–15)
AST: 19 U/L (ref 15–41)
Albumin: 4.5 g/dL (ref 3.5–5.0)
Alkaline Phosphatase: 36 U/L — ABNORMAL LOW (ref 38–126)
BILIRUBIN TOTAL: 0.7 mg/dL (ref 0.3–1.2)
BUN: 12 mg/dL (ref 8–23)
CO2: 22 mmol/L (ref 22–32)
Calcium: 9.4 mg/dL (ref 8.9–10.3)
Chloride: 107 mmol/L (ref 98–111)
Creatinine, Ser: 1.25 mg/dL — ABNORMAL HIGH (ref 0.61–1.24)
GFR calc Af Amer: >60 mL/min (ref >60)
GFR, EST NON AFRICAN AMERICAN: 57 mL/min — AB (ref >60)
Glucose, Bld: 177 mg/dL — ABNORMAL HIGH (ref 70–99)
POTASSIUM: 4 mmol/L (ref 3.5–5.1)
Sodium: 137 mmol/L (ref 135–145)
TOTAL PROTEIN: 6.9 g/dL (ref 6.5–8.1)

## 2018-09-09 LAB — LACTATE DEHYDROGENASE: LDH: 161 U/L (ref 98–192)

## 2018-09-09 NOTE — Progress Notes (Signed)
Patient continues to take Hydrea as directed. Denies missed doses. States he has fatigue and balance issues since starting Hydrea.

## 2018-09-09 NOTE — Progress Notes (Signed)
Diagnosis Essential thrombocytosis ([place]) - Plan: CBC with Differential/Platelet, Comprehensive metabolic panel  Staging Cancer Staging No matching staging information was found for the patient.  Assessment and Plan:  Essential thrombocytosis ([place]) 1.  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  He started hydroxyurea in December 2018.  He is also taking baby aspirin daily.  He is on 1000 mg daily.    Labs done 09/09/2018 reviewed and showed WBC 4.9 HB 12.8 plts 351,000. Chemistries WNL with K+ 4 Cr 1.2 and normal LFTs.  Pt will continue Hydrea at present dose.  Plts WNL on labs done today.    2.  Dizziness.  BP is 127/89.  HR 91.  Pt reports he drinks sodas but does not drink water.  Occasional water hydration recommended.  He also reports being told in the past regarding need for pacemaker.  Follow-up with cardiology or PCP as recommended as pt symptoms may be related to reported cardiac history.    3.  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  Pt was given option of pulmonary referral.  He reports he will discuss this further with PCP and Dr. Worthy Keeler.  Pulse ox 98% on room air.  4.  HTN.  BP is 127/89.  Follow-up with PCP.    5.  DM.  Continue follow-up with PCP.    6.  Macrocytosis.  MCV 103.  Secondary to hydrea.   30 minutes spent with more than 50% spent in counseling and coordination of care.     Current Status:  Pt is seen today for follow-up.  He reports problems with dizziness.  He also reports he was told in the past he had lung injury related to welding.       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 Essential thrombocytosis ([place]) [D47.3] 04/08/2018    Past Medical History Past Medical History:  Diagnosis Date  . Diabetes ([place])   . Hypertension     Past Surgical History History reviewed. No pertinent surgical history.  Family History Family History  Problem Relation Age of Onset  . Dementia Mother   . Cancer Paternal Uncle        lung     Social History  reports that he quit smoking about 40 years ago. His smoking use included cigarettes. He has a 1.50 pack-year smoking history. He has never used smokeless tobacco. He reports that he does not drink alcohol or use drugs.  Medications  Current Outpatient Medications:  .  ACCU-CHEK AVIVA PLUS test strip, TEST BLOOD SUGAR D, Disp: , Rfl: 3 .  cyclobenzaprine (FLEXERIL) 10 MG tablet, Take by mouth., Disp: , Rfl:  .  hydroxyurea (HYDREA) 500 MG capsule, TK 2 CS PO D, Disp: , Rfl: 3 .  metFORMIN (GLUCOPHAGE-XR) 500 MG 24 hr tablet, TK 1 T PO QD, Disp: , Rfl: 6 .  metoprolol succinate (TOPROL-XL) 50 MG 24 hr tablet, TK 1 T PO QD, Disp: , Rfl: 3 .  promethazine (PHENERGAN) 12.5 MG tablet, TAKE 1-2 TABLETS PO BID PRN, Disp: , Rfl: 0  Allergies Ivp dye  [iodinated diagnostic agents]  Review of Systems Review of Systems - Oncology ROS negative other than dizziness   Physical  Exam  Vitals Wt Readings from Last 3 Encounters:  09/09/18 252 lb 3.2 oz (114.4 kg)  06/09/18 250 lb 3.2 oz (113.5 kg)  04/08/18 259 lb (117.5 kg)   Temp Readings from Last 3 Encounters:  09/09/18 98.3 F (36.8 C) (Oral)  06/09/18 98.4 F (36.9 C) (Oral)   BP Readings from Last 3 Encounters:  09/09/18 127/89  06/09/18 138/88  04/08/18 (!) 155/83   Pulse Readings from Last 3 Encounters:  09/09/18 91  06/09/18 85  04/08/18 71    Constitutional: Well-developed, well-nourished, and in no distress.   HENT: Head: Normocephalic and atraumatic.  Mouth/Throat: No oropharyngeal exudate. Mucosa moist. Eyes: Pupils are equal, round, and reactive to light. Conjunctivae are normal. No scleral icterus.  Neck: Normal range of motion. Neck supple. No JVD present.  Cardiovascular: Normal rate, regular rhythm and normal heart sounds.  Exam reveals no gallop and no friction rub.   No murmur heard. Pulmonary/Chest: Effort normal and breath sounds normal. No respiratory distress. No wheezes.No rales.  Abdominal: Soft. Bowel sounds are normal. No distension. There is no tenderness. There is no guarding.  Musculoskeletal: No edema or tenderness.  Lymphadenopathy: No cervical, axillary or supraclavicular adenopathy.  Neurological: Alert and oriented to person, place, and time. No cranial nerve deficit.  Skin: Skin is warm and dry. No rash noted. No erythema. No pallor.  Psychiatric: Affect and judgment normal.   Labs Appointment on 09/09/2018  Component Date Value Ref Range Status  . WBC 09/09/2018 4.9  4.0 - 10.5 K/uL Final  . RBC 09/09/2018 3.85* 4.22 - 5.81 MIL/uL Final  . Hemoglobin 09/09/2018 12.8* 13.0 - 17.0 g/dL Final  . HCT 09/09/2018 39.5  39.0 - 52.0 % Final  . MCV 09/09/2018 102.6* 78.0 - 100.0 fL Final  . MCH 09/09/2018 33.2  26.0 - 34.0 pg Final  . MCHC 09/09/2018 32.4  30.0 - 36.0 g/dL Final  . RDW 09/09/2018 16.2* 11.5 - 15.5 % Final  . Platelets 09/09/2018 351  150 - 400  K/uL Final  . Neutrophils Relative % 09/09/2018 63  % Final  . Neutro Abs 09/09/2018 3.1  1.7 - 7.7 K/uL Final  . Lymphocytes Relative 09/09/2018 27  % Final  . Lymphs Abs 09/09/2018 1.3  0.7 - 4.0 K/uL Final  . Monocytes Relative 09/09/2018 8  % Final  . Monocytes Absolute 09/09/2018 0.4  0.1 - 1.0 K/uL Final  . Eosinophils Relative 09/09/2018 1  % Final  . Eosinophils Absolute 09/09/2018 0.1  0.0 - 0.7 K/uL Final  . Basophils Relative 09/09/2018 1  % Final  . Basophils Absolute 09/09/2018 0.1  0.0 - 0.1 K/uL Final   Performed at [facility], [address], [place], [place] [address]  . Sodium 09/09/2018 137  135 - 145  mmol/L Final  . Potassium 09/09/2018 4.0  3.5 - 5.1 mmol/L Final  . Chloride 09/09/2018 107  98 - 111 mmol/L Final  . CO2 09/09/2018 22  22 - 32 mmol/L Final  . Glucose, Bld 09/09/2018 177* 70 - 99 mg/dL Final  . BUN 09/09/2018 12  8 - 23 mg/dL Final  . Creatinine, Ser 09/09/2018 1.25* 0.61 - 1.24 mg/dL Final  . Calcium 09/09/2018 9.4  8.9 - 10.3 mg/dL Final  . Total Protein 09/09/2018 6.9  6.5 - 8.1 g/dL Final  . Albumin 09/09/2018 4.5  3.5 - 5.0 g/dL Final  . AST 09/09/2018 19  15 - 41 U/L Final  . ALT 09/09/2018 28  0 - 44 U/L Final  . Alkaline Phosphatase 09/09/2018 36* 38 - 126 U/L Final  . Total Bilirubin 09/09/2018 0.7  0.3 - 1.2 mg/dL Final  . GFR calc non Af Amer 09/09/2018 57* >60 mL/min Final  . GFR calc Af Amer 09/09/2018 >60  >60 mL/min Final   Comment: (NOTE) The eGFR has been calculated using the CKD EPI equation. This calculation has not been validated in all clinical situations. eGFR's persistently <60 mL/min signify possible Chronic Kidney Disease.   Georgiann Hahn gap 09/09/2018 8  5 - 15 Final   Performed at [facility], [address] [place], [place] [address]  . LDH 09/09/2018 161  98 - 192 U/L Final   Performed at [facility], [address], [place], [place] [address]     Pathology Orders Placed This Encounter  Procedures  . CBC with  Differential/Platelet    Standing Status:   Future    Standing Expiration Date:   09/10/2019  . Comprehensive metabolic panel    Standing Status:   Future    Standing Expiration Date:   09/10/2019       Zoila Shutter MD

## 2018-09-14 ENCOUNTER — Ambulatory Visit (INDEPENDENT_AMBULATORY_CARE_PROVIDER_SITE_OTHER): Payer: Medicare Other | Admitting: Otolaryngology

## 2018-09-14 DIAGNOSIS — D1809 Hemangioma of other sites: Secondary | ICD-10-CM

## 2018-10-28 DIAGNOSIS — I10 Essential (primary) hypertension: Secondary | ICD-10-CM | POA: Diagnosis not present

## 2018-10-28 DIAGNOSIS — E1165 Type 2 diabetes mellitus with hyperglycemia: Secondary | ICD-10-CM | POA: Diagnosis not present

## 2018-10-28 DIAGNOSIS — M109 Gout, unspecified: Secondary | ICD-10-CM | POA: Diagnosis not present

## 2018-10-28 DIAGNOSIS — E782 Mixed hyperlipidemia: Secondary | ICD-10-CM | POA: Diagnosis not present

## 2018-11-17 DIAGNOSIS — I10 Essential (primary) hypertension: Secondary | ICD-10-CM | POA: Diagnosis not present

## 2018-11-17 DIAGNOSIS — N189 Chronic kidney disease, unspecified: Secondary | ICD-10-CM | POA: Diagnosis not present

## 2018-11-17 DIAGNOSIS — E782 Mixed hyperlipidemia: Secondary | ICD-10-CM | POA: Diagnosis not present

## 2018-11-17 DIAGNOSIS — E1142 Type 2 diabetes mellitus with diabetic polyneuropathy: Secondary | ICD-10-CM | POA: Diagnosis not present

## 2018-11-17 DIAGNOSIS — E1165 Type 2 diabetes mellitus with hyperglycemia: Secondary | ICD-10-CM | POA: Diagnosis not present

## 2018-11-20 DIAGNOSIS — E1142 Type 2 diabetes mellitus with diabetic polyneuropathy: Secondary | ICD-10-CM | POA: Diagnosis not present

## 2018-11-20 DIAGNOSIS — Z6836 Body mass index (BMI) 36.0-36.9, adult: Secondary | ICD-10-CM | POA: Diagnosis not present

## 2018-11-20 DIAGNOSIS — M109 Gout, unspecified: Secondary | ICD-10-CM | POA: Diagnosis not present

## 2018-11-20 DIAGNOSIS — G6289 Other specified polyneuropathies: Secondary | ICD-10-CM | POA: Diagnosis not present

## 2018-11-20 DIAGNOSIS — D473 Essential (hemorrhagic) thrombocythemia: Secondary | ICD-10-CM | POA: Diagnosis not present

## 2018-11-20 DIAGNOSIS — I10 Essential (primary) hypertension: Secondary | ICD-10-CM | POA: Diagnosis not present

## 2018-11-20 DIAGNOSIS — E782 Mixed hyperlipidemia: Secondary | ICD-10-CM | POA: Diagnosis not present

## 2018-12-08 ENCOUNTER — Other Ambulatory Visit (HOSPITAL_COMMUNITY): Payer: Self-pay | Admitting: Hematology

## 2018-12-08 NOTE — Telephone Encounter (Signed)
Request for hydrea refill, will hold off until his visit on Thursday with physician.

## 2018-12-09 ENCOUNTER — Ambulatory Visit (HOSPITAL_COMMUNITY): Payer: 59 | Admitting: Internal Medicine

## 2018-12-09 ENCOUNTER — Other Ambulatory Visit (HOSPITAL_COMMUNITY): Payer: 59

## 2018-12-10 ENCOUNTER — Inpatient Hospital Stay (HOSPITAL_COMMUNITY): Payer: Medicare Other | Attending: Hematology

## 2018-12-10 ENCOUNTER — Other Ambulatory Visit: Payer: Self-pay

## 2018-12-10 ENCOUNTER — Other Ambulatory Visit (HOSPITAL_COMMUNITY): Payer: Self-pay | Admitting: *Deleted

## 2018-12-10 ENCOUNTER — Encounter (HOSPITAL_COMMUNITY): Payer: Self-pay | Admitting: Hematology

## 2018-12-10 ENCOUNTER — Inpatient Hospital Stay (HOSPITAL_BASED_OUTPATIENT_CLINIC_OR_DEPARTMENT_OTHER): Payer: Medicare Other | Admitting: Hematology

## 2018-12-10 VITALS — BP 151/81 | HR 65 | Temp 98.5°F | Resp 18 | Wt 256.2 lb

## 2018-12-10 DIAGNOSIS — R42 Dizziness and giddiness: Secondary | ICD-10-CM

## 2018-12-10 DIAGNOSIS — Z7982 Long term (current) use of aspirin: Secondary | ICD-10-CM | POA: Diagnosis not present

## 2018-12-10 DIAGNOSIS — R200 Anesthesia of skin: Secondary | ICD-10-CM | POA: Insufficient documentation

## 2018-12-10 DIAGNOSIS — E119 Type 2 diabetes mellitus without complications: Secondary | ICD-10-CM | POA: Insufficient documentation

## 2018-12-10 DIAGNOSIS — D473 Essential (hemorrhagic) thrombocythemia: Secondary | ICD-10-CM | POA: Diagnosis not present

## 2018-12-10 DIAGNOSIS — Z87891 Personal history of nicotine dependence: Secondary | ICD-10-CM | POA: Insufficient documentation

## 2018-12-10 DIAGNOSIS — Z7984 Long term (current) use of oral hypoglycemic drugs: Secondary | ICD-10-CM

## 2018-12-10 DIAGNOSIS — K1379 Other lesions of oral mucosa: Secondary | ICD-10-CM | POA: Insufficient documentation

## 2018-12-10 DIAGNOSIS — Z86718 Personal history of other venous thrombosis and embolism: Secondary | ICD-10-CM | POA: Insufficient documentation

## 2018-12-10 DIAGNOSIS — I10 Essential (primary) hypertension: Secondary | ICD-10-CM

## 2018-12-10 LAB — CBC WITH DIFFERENTIAL/PLATELET
Abs Immature Granulocytes: 0.15 10*3/uL — ABNORMAL HIGH (ref 0.00–0.07)
Basophils Absolute: 0.1 10*3/uL (ref 0.0–0.1)
Basophils Relative: 2 %
EOS ABS: 0.1 10*3/uL (ref 0.0–0.5)
Eosinophils Relative: 1 %
HCT: 38.3 % — ABNORMAL LOW (ref 39.0–52.0)
Hemoglobin: 12.2 g/dL — ABNORMAL LOW (ref 13.0–17.0)
IMMATURE GRANULOCYTES: 3 %
Lymphocytes Relative: 23 %
Lymphs Abs: 1.1 10*3/uL (ref 0.7–4.0)
MCH: 32.8 pg (ref 26.0–34.0)
MCHC: 31.9 g/dL (ref 30.0–36.0)
MCV: 103 fL — ABNORMAL HIGH (ref 80.0–100.0)
Monocytes Absolute: 0.5 10*3/uL (ref 0.1–1.0)
Monocytes Relative: 10 %
NRBC: 0 % (ref 0.0–0.2)
Neutro Abs: 2.8 10*3/uL (ref 1.7–7.7)
Neutrophils Relative %: 61 %
PLATELETS: 458 10*3/uL — AB (ref 150–400)
RBC: 3.72 MIL/uL — AB (ref 4.22–5.81)
RDW: 16.1 % — AB (ref 11.5–15.5)
WBC: 4.6 10*3/uL (ref 4.0–10.5)

## 2018-12-10 LAB — COMPREHENSIVE METABOLIC PANEL
ALBUMIN: 4.4 g/dL (ref 3.5–5.0)
ALK PHOS: 40 U/L (ref 38–126)
ALT: 30 U/L (ref 0–44)
ANION GAP: 9 (ref 5–15)
AST: 20 U/L (ref 15–41)
BUN: 18 mg/dL (ref 8–23)
CHLORIDE: 107 mmol/L (ref 98–111)
CO2: 21 mmol/L — AB (ref 22–32)
Calcium: 8.9 mg/dL (ref 8.9–10.3)
Creatinine, Ser: 1.2 mg/dL (ref 0.61–1.24)
GFR calc non Af Amer: >60 mL/min (ref >60)
Glucose, Bld: 149 mg/dL — ABNORMAL HIGH (ref 70–99)
Potassium: 3.9 mmol/L (ref 3.5–5.1)
SODIUM: 137 mmol/L (ref 135–145)
TOTAL PROTEIN: 6.6 g/dL (ref 6.5–8.1)
Total Bilirubin: 0.8 mg/dL (ref 0.3–1.2)

## 2018-12-10 MED ORDER — HYDROXYUREA 500 MG PO CAPS: ORAL_CAPSULE | ORAL | 3 refills | Status: DC

## 2018-12-10 NOTE — Addendum Note (Signed)
Addended by: Derek Jack on: 12/10/2018 03:32 PM   Modules accepted: Orders

## 2018-12-10 NOTE — Progress Notes (Signed)
Justin Berger, [place] [address]   CLINIC:  Medical Oncology/Hematology  PCP:  Caryl Bis, MD [place] [phone]   REASON FOR VISIT:  Follow-up for essential thrombocytosis.  CURRENT THERAPY: Hydroxyurea 2 tablets daily.  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   INTERVAL HISTORY:  Justin Berger 67 y.o. male returns for follow-up of essential thrombocytosis.  He has run out of Hydrea for the last 2 days.  He denies any problems taking 2 tablets of Hydrea daily.  He denies any fevers or infections in the last few months.  He complains of problems with balance but has not fallen.  He also developed numbness in the feet for the past 2 months.  No leg ulcers reported.  No recent hospitalizations.  He is also having some issues with his vision.  REVIEW OF SYSTEMS:  Review of Systems  Constitutional: Negative for fatigue.  Gastrointestinal: Negative  for diarrhea.  Musculoskeletal: Positive for gait problem.  Neurological: Positive for gait problem and numbness.  All other systems reviewed and are negative.    PAST MEDICAL/SURGICAL HISTORY:  Past Medical History:  Diagnosis Date  . Diabetes ([place])   . Hypertension    History reviewed. No pertinent surgical history.   SOCIAL HISTORY:  Social History   Socioeconomic History  . Marital status: Married    Spouse name: Not on file  . Number of children: Not on file  . Years of education: Not on file  . Highest education level: Not on file  Occupational History  . Not on file  Social Needs  . Financial resource strain: Not on file  . Food insecurity:    Worry: Not on file    Inability: Not on file  . Transportation needs:    Medical: Not on file    Non-medical: Not on file  Tobacco Use  . Smoking status: Former Smoker    Packs/day: 0.50    Years: 3.00    Pack years: 1.50    Types: Cigarettes    Last attempt to quit: 04/08/1978    Years since quitting: 40.7  . Smokeless tobacco: Never Used  Substance and Sexual Activity  . Alcohol use: Never    Frequency: Never  . Drug use: Never  . Sexual activity: Not on file  Lifestyle  . Physical activity:    Days per week: Not on file    Minutes per session: Not on file  . Stress: Not on file  Relationships  . Social connections:    Talks on phone: Not on file    Gets together: Not on file    Attends religious service: Not on file    Active member of club or organization: Not on file    Attends meetings of clubs or organizations: Not on file    Relationship status: Not on file  . Intimate partner violence:    Fear of current or ex partner: Not on file    Emotionally abused: Not on file    Physically abused: Not on file    Forced sexual activity: Not on file  Other Topics Concern  . Not on file  Social History Narrative  . Not on file    FAMILY HISTORY:  Family History  Problem Relation Age of Onset  .  Dementia Mother   . Cancer Paternal Uncle        lung    CURRENT MEDICATIONS:  Outpatient Encounter Medications as of 12/10/2018  Medication Sig  . ACCU-CHEK AVIVA PLUS test strip TEST BLOOD SUGAR D  . cyclobenzaprine (FLEXERIL) 10 MG tablet Take by mouth.  . hydroxyurea (HYDREA) 500 MG capsule TK 2 CS PO D  . metFORMIN (GLUCOPHAGE-XR) 500 MG 24 hr tablet TK 1 T PO QD  . metoprolol succinate (TOPROL-XL) 50 MG 24 hr tablet TK 1 T PO QD  . promethazine (PHENERGAN) 12.5 MG tablet TAKE 1-2 TABLETS PO BID PRN  . [DISCONTINUED] hydroxyurea (HYDREA) 500 MG capsule TK 2 CS PO D   No facility-administered encounter medications on file as of 12/10/2018.     ALLERGIES:  Allergies  Allergen Reactions  . Ivp Dye  [Iodinated Diagnostic Agents]      PHYSICAL EXAM:  ECOG Performance status: 1  Vitals:   12/10/18 1428  BP: (!) 151/81  Pulse: 65  Resp: 18  Temp: 98.5 F (36.9 C)  SpO2: 98%   Filed Weights   12/10/18 1428  Weight: 256 lb 3.2 oz (116.2 kg)    Physical Exam Focused physical examination did not reveal any palpable adenopathy or splenomegaly.  The lesion at the angle of the mouth is slightly elevated and hyperpigmented.  LABORATORY DATA:  I have reviewed the labs as listed.  CBC    Component Value Date/Time   WBC 4.6 12/10/2018 1329   RBC 3.72 (L) 12/10/2018 1329   HGB 12.2 (L) 12/10/2018 1329   HCT 38.3 (L) 12/10/2018 1329   PLT 458 (H) 12/10/2018 1329   MCV 103.0 (H) 12/10/2018 1329   MCH 32.8 12/10/2018 1329   MCHC 31.9 12/10/2018 1329   RDW 16.1 (H) 12/10/2018 1329   LYMPHSABS 1.1 12/10/2018 1329   MONOABS 0.5 12/10/2018 1329   EOSABS 0.1 12/10/2018 1329   BASOSABS 0.1 12/10/2018 1329   CMP Latest Ref Rng & Units 12/10/2018 09/09/2018 06/09/2018  Glucose 70 - 99 mg/dL 149(H) 177(H) 128(H)  BUN 8 - 23 mg/dL _0 Creatinine 0.61 - 1.24 mg/dL 1.20 1.25(H) 1.34(H)  Sodium 135 - 145 mmol/L 137 137 140  Potassium 3.5 - 5.1 mmol/L 3.9 4.0 3.8  Chloride  98 - 111 mmol/L 107 107 106  CO2 22 - 32 mmol/L 21(L) 22 24  Calcium 8.9 - 10.3 mg/dL 8.9 9.4 9.4  Total Protein 6.5 - 8.1 g/dL 6.6 6.9 7.2  Total Bilirubin 0.3 - 1.2 mg/dL 0.8 0.7 1.1  Alkaline Phos 38 - 126 U/L 40 36(L) 38  AST 15 - 41 U/L _1 ALT 0 -  44 U/L _0 ASSESSMENT & PLAN:   Essential thrombocytosis ([facility]) 1.  Essential thrombocytosis, CALR positive: -High risk disease given history of thrombosis with a left leg DVT and age more than 1.  No vasomotor symptoms. -Hydroxyurea started in December 2018.  He also takes 81 mg aspirin daily. -Hydroxyurea decreased to 2 tablets daily around April 2019.  He is tolerating it very well. -He does report feeling off balance at times when he is walking.  He has not fallen.  He also has vertigo when he is lying down and looks up.  I have offered him to make a referral to neurology.  He would like to hold off on it at this time. -We discussed the results of the CBC today.  Hematocrit is remaining below 45.  Platelet count is 458. - Patient has not taken hydroxyurea for the last 2 days as he ran out of prescription.  He will start back on 2 pills daily.  I will see him back in 3 months for follow-up with repeat blood work.  2.  Diabetes: His Metformin ER has been increased to 1021m daily.  He is having slight diarrhea from it.  3.  Hypertension: This is well controlled on Toprol-XL.  4.  Right oral mucosal lesion: - He follows up with Dr. 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      Orders placed this encounter:  Orders Placed This Encounter  Procedures  . CBC with Differential  . Comprehensive metabolic panel      SDerek Jack MD [phone]

## 2018-12-10 NOTE — Assessment & Plan Note (Signed)
1.  Essential thrombocytosis, CALR positive: -High risk disease given history of thrombosis with a left leg DVT and age more than 23.  No vasomotor symptoms. -Hydroxyurea started in December 2018.  He also takes 81 mg aspirin daily. -Hydroxyurea decreased to 2 tablets daily around April 2019.  He is tolerating it very well. -He does report feeling off balance at times when he is walking.  He has not fallen.  He also has vertigo when he is lying down and looks up.  I have offered him to make a referral to neurology.  He would like to hold off on it at this time. -We discussed the results of the CBC today.  Hematocrit is remaining below 45.  Platelet count is 458. - Patient has not taken hydroxyurea for the last 2 days as he ran out of prescription.  He will start back on 2 pills daily.  I will see him back in 3 months for follow-up with repeat blood work.  2.  Diabetes: His Metformin ER has been increased to 1000mg  daily.  He is having slight diarrhea from it.  3.  Hypertension: This is well controlled on Toprol-XL.  4.  Right oral mucosal lesion: - He follows up with Dr. Benjamine Mola.  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

## 2018-12-21 DIAGNOSIS — L03031 Cellulitis of right toe: Secondary | ICD-10-CM | POA: Diagnosis not present

## 2019-01-04 DIAGNOSIS — L03031 Cellulitis of right toe: Secondary | ICD-10-CM | POA: Diagnosis not present

## 2019-01-11 DIAGNOSIS — E7800 Pure hypercholesterolemia, unspecified: Secondary | ICD-10-CM | POA: Diagnosis not present

## 2019-01-11 DIAGNOSIS — I10 Essential (primary) hypertension: Secondary | ICD-10-CM | POA: Diagnosis not present

## 2019-01-11 DIAGNOSIS — E1142 Type 2 diabetes mellitus with diabetic polyneuropathy: Secondary | ICD-10-CM | POA: Diagnosis not present

## 2019-01-11 DIAGNOSIS — E782 Mixed hyperlipidemia: Secondary | ICD-10-CM | POA: Diagnosis not present

## 2019-01-11 DIAGNOSIS — E7801 Familial hypercholesterolemia: Secondary | ICD-10-CM | POA: Diagnosis not present

## 2019-01-11 DIAGNOSIS — N189 Chronic kidney disease, unspecified: Secondary | ICD-10-CM | POA: Diagnosis not present

## 2019-01-11 DIAGNOSIS — E1165 Type 2 diabetes mellitus with hyperglycemia: Secondary | ICD-10-CM | POA: Diagnosis not present

## 2019-01-13 DIAGNOSIS — Z6837 Body mass index (BMI) 37.0-37.9, adult: Secondary | ICD-10-CM | POA: Diagnosis not present

## 2019-01-13 DIAGNOSIS — Z0001 Encounter for general adult medical examination with abnormal findings: Secondary | ICD-10-CM | POA: Diagnosis not present

## 2019-01-13 DIAGNOSIS — I10 Essential (primary) hypertension: Secondary | ICD-10-CM | POA: Diagnosis not present

## 2019-01-22 DIAGNOSIS — L03031 Cellulitis of right toe: Secondary | ICD-10-CM | POA: Diagnosis not present

## 2019-02-25 DIAGNOSIS — H34832 Tributary (branch) retinal vein occlusion, left eye, with macular edema: Secondary | ICD-10-CM | POA: Diagnosis not present

## 2019-03-01 DIAGNOSIS — J019 Acute sinusitis, unspecified: Secondary | ICD-10-CM | POA: Diagnosis not present

## 2019-03-01 DIAGNOSIS — Z6838 Body mass index (BMI) 38.0-38.9, adult: Secondary | ICD-10-CM | POA: Diagnosis not present

## 2019-03-01 DIAGNOSIS — I10 Essential (primary) hypertension: Secondary | ICD-10-CM | POA: Diagnosis not present

## 2019-03-01 DIAGNOSIS — D473 Essential (hemorrhagic) thrombocythemia: Secondary | ICD-10-CM | POA: Diagnosis not present

## 2019-03-02 DIAGNOSIS — H2513 Age-related nuclear cataract, bilateral: Secondary | ICD-10-CM | POA: Diagnosis not present

## 2019-03-02 DIAGNOSIS — H34832 Tributary (branch) retinal vein occlusion, left eye, with macular edema: Secondary | ICD-10-CM | POA: Diagnosis not present

## 2019-03-12 ENCOUNTER — Inpatient Hospital Stay (HOSPITAL_BASED_OUTPATIENT_CLINIC_OR_DEPARTMENT_OTHER): Payer: Medicare Other | Admitting: Hematology

## 2019-03-12 ENCOUNTER — Inpatient Hospital Stay (HOSPITAL_COMMUNITY): Payer: Medicare Other | Attending: Hematology

## 2019-03-12 ENCOUNTER — Other Ambulatory Visit: Payer: Self-pay

## 2019-03-12 ENCOUNTER — Encounter (HOSPITAL_COMMUNITY): Payer: Self-pay | Admitting: Hematology

## 2019-03-12 VITALS — BP 123/84 | HR 88 | Temp 98.4°F | Resp 16 | Wt 253.5 lb

## 2019-03-12 DIAGNOSIS — Z87891 Personal history of nicotine dependence: Secondary | ICD-10-CM | POA: Diagnosis not present

## 2019-03-12 DIAGNOSIS — I10 Essential (primary) hypertension: Secondary | ICD-10-CM | POA: Insufficient documentation

## 2019-03-12 DIAGNOSIS — E119 Type 2 diabetes mellitus without complications: Secondary | ICD-10-CM

## 2019-03-12 DIAGNOSIS — D473 Essential (hemorrhagic) thrombocythemia: Secondary | ICD-10-CM | POA: Diagnosis not present

## 2019-03-12 LAB — COMPREHENSIVE METABOLIC PANEL
ALT: 31 U/L (ref 0–44)
AST: 22 U/L (ref 15–41)
Albumin: 4.4 g/dL (ref 3.5–5.0)
Alkaline Phosphatase: 39 U/L (ref 38–126)
Anion gap: 9 (ref 5–15)
BUN: 21 mg/dL (ref 8–23)
CO2: 22 mmol/L (ref 22–32)
Calcium: 9.5 mg/dL (ref 8.9–10.3)
Chloride: 106 mmol/L (ref 98–111)
Creatinine, Ser: 1.26 mg/dL — ABNORMAL HIGH (ref 0.61–1.24)
GFR calc Af Amer: >60 mL/min (ref >60)
GFR calc non Af Amer: 58 mL/min — ABNORMAL LOW (ref >60)
Glucose, Bld: 184 mg/dL — ABNORMAL HIGH (ref 70–99)
Potassium: 4 mmol/L (ref 3.5–5.1)
SODIUM: 137 mmol/L (ref 135–145)
Total Bilirubin: 0.9 mg/dL (ref 0.3–1.2)
Total Protein: 6.7 g/dL (ref 6.5–8.1)

## 2019-03-12 LAB — CBC WITH DIFFERENTIAL/PLATELET
Abs Immature Granulocytes: 0.35 10*3/uL — ABNORMAL HIGH (ref 0.00–0.07)
Basophils Absolute: 0.1 10*3/uL (ref 0.0–0.1)
Basophils Relative: 2 %
EOS PCT: 1 %
Eosinophils Absolute: 0.1 10*3/uL (ref 0.0–0.5)
HCT: 39.9 % (ref 39.0–52.0)
Hemoglobin: 12.4 g/dL — ABNORMAL LOW (ref 13.0–17.0)
Immature Granulocytes: 5 %
Lymphocytes Relative: 22 %
Lymphs Abs: 1.5 10*3/uL (ref 0.7–4.0)
MCH: 32.5 pg (ref 26.0–34.0)
MCHC: 31.1 g/dL (ref 30.0–36.0)
MCV: 104.5 fL — ABNORMAL HIGH (ref 80.0–100.0)
MONO ABS: 0.6 10*3/uL (ref 0.1–1.0)
Monocytes Relative: 9 %
Neutro Abs: 3.9 10*3/uL (ref 1.7–7.7)
Neutrophils Relative %: 61 %
Platelets: 467 10*3/uL — ABNORMAL HIGH (ref 150–400)
RBC: 3.82 MIL/uL — AB (ref 4.22–5.81)
RDW: 15.9 % — ABNORMAL HIGH (ref 11.5–15.5)
WBC: 6.5 10*3/uL (ref 4.0–10.5)
nRBC: 0 % (ref 0.0–0.2)

## 2019-03-12 NOTE — Assessment & Plan Note (Addendum)
1.  Essential thrombocytosis, CALR positive: -High risk disease given history of thrombosis with a left leg DVT and age more than 46.  No vasomotor symptoms. -Hydroxyurea started in December 2018, takes 81 mg of aspirin daily.  -Hydroxyurea decreased to 2 tablets daily around April 2019. - He is tolerating hydroxyurea very well. -He reportedly had to have a injection in his left eye.  He was bleeding a lot after the injection.  I have told him to discontinue aspirin 1 week prior to the next injection on April 1.  He will start back aspirin 1 to 2 days after the procedure. - We reviewed his blood work.  His platelet count is remaining slightly above 450.  It was 467.  Patient reports that he had upper respiratory infection and is still taking Augmentin.  Maybe there is a small component of reactive thrombocytosis.  Hematocrit is 39.9. - He will continue hydroxyurea 2 tablets daily.  I will see him back in 3 months for follow-up.  2.  Diabetes: -He is taking metformin ER 500 mg daily.    3.  Hypertension: -Systolic blood pressure is 123.  He will continue Toprol-XL 50 mg daily.    4.  Right oral mucosal lesion: - He follows up with Dr. Benjamine Mola.  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

## 2019-03-12 NOTE — Progress Notes (Signed)
[place], [place] [address]   CLINIC:  Medical Oncology/Hematology  PCP:  Caryl Bis, MD [place] [address] [phone]   REASON FOR VISIT:  Follow-up for Essential thrombocytosis, CALR positive  CURRENT THERAPY: Hydroxyurea  BRIEF ONCOLOGIC HISTORY:    Essential thrombocytosis ([place])   08/31/2011 Cancer Diagnosis    History of colon cancer, status post resection in 2012, status post adjuvant chemotherapy with 6 months of 5-FU and leucovorin under the direction of Dr.Karb at [facility]    09/29/2017 Imaging    CT scan of the abdomen done for nonspecific abdominal pain shows incidental splenomegaly  Patient referred to [place] for further workup    10/30/2017 Genetic Testing    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   INTERVAL HISTORY:  Justin Berger 68 y.o. male returns for routine follow-up. He is here today by himself. He states that he has been feeling so so. He states that he has been experiencing some weakness. He states that the blood vessel in his left eye has been swollen and they have done injections for bleeding. Denies any nausea, vomiting, or diarrhea. Denies any new pains. Had not noticed any recent bleeding such as epistaxis, hematuria or hematochezia. Denies recent chest pain on exertion, shortness of breath on minimal exertion, pre-syncopal episodes, or palpitations. Denies any numbness or  tingling in hands or feet. Denies any recent fevers, infections, or recent hospitalizations. Patient reports appetite at 100% and energy level at 25%.    REVIEW OF SYSTEMS:  Review of Systems  Eyes: Positive for eye problems.     PAST MEDICAL/SURGICAL HISTORY:  Past Medical History:  Diagnosis Date  . Diabetes ([place])   . Hypertension    History reviewed. No pertinent surgical history.   SOCIAL HISTORY:  Social History   Socioeconomic History  . Marital status: Married    Spouse name: Not on file  . Number of children: Not on file  . Years of education: Not on file  . Highest education level: Not on file  Occupational History  . Not on file  Social Needs  . Financial resource strain: Not on file  . Food insecurity:    Worry: Not on file    Inability: Not on file  . Transportation needs:    Medical: Not on file    Non-medical: Not on file  Tobacco Use  . Smoking status: Former Smoker    Packs/day: 0.50    Years: 3.00    Pack years: 1.50    Types: Cigarettes    Last attempt to quit: 04/08/1978    Years since quitting: 40.9  . Smokeless tobacco: Never Used  Substance and Sexual Activity  . Alcohol use: Never    Frequency: Never  . Drug use: Never  . Sexual activity: Not on file  Lifestyle  . Physical activity:    Days per week: Not on file    Minutes per session: Not on file  .  Stress: Not on file  Relationships  . Social connections:    Talks on phone: Not on file    Gets together: Not on file    Attends religious service: Not on file    Active member of club or organization: Not on file    Attends meetings of clubs or organizations: Not on file    Relationship status: Not on file  . Intimate partner violence:    Fear of current or ex partner: Not on file    Emotionally abused: Not on file    Physically abused: Not on file    Forced sexual activity: Not on file  Other Topics Concern  . Not on file  Social History Narrative  . Not on file     FAMILY HISTORY:  Family History  Problem Relation Age of Onset  . Dementia Mother   . Cancer Paternal Uncle        lung    CURRENT MEDICATIONS:  Outpatient Encounter Medications as of 03/12/2019  Medication Sig  . ACCU-CHEK AVIVA PLUS test strip TEST BLOOD SUGAR D  . cyclobenzaprine (FLEXERIL) 10 MG tablet Take by mouth.  . hydroxyurea (HYDREA) 500 MG capsule TK 2 CS PO D  . metFORMIN (GLUCOPHAGE-XR) 500 MG 24 hr tablet TK 1 T PO QD  . metoprolol succinate (TOPROL-XL) 50 MG 24 hr tablet TK 1 T PO QD  . promethazine (PHENERGAN) 12.5 MG tablet TAKE 1-2 TABLETS PO BID PRN   No facility-administered encounter medications on file as of 03/12/2019.     ALLERGIES:  Allergies  Allergen Reactions  . Ivp Dye  [Iodinated Diagnostic Agents]      PHYSICAL EXAM:  ECOG Performance status: 1  Vitals:   03/12/19 1142  BP: 123/84  Pulse: 88  Resp: 16  Temp: 98.4 F (36.9 C)  SpO2: 97%   Filed Weights   03/12/19 1142  Weight: 253 lb 8 oz (115 kg)    Physical Exam Constitutional:      Appearance: Normal appearance.  Cardiovascular:     Rate and Rhythm: Normal rate and regular rhythm.  Pulmonary:     Effort: Pulmonary effort is normal.     Breath sounds: Normal breath sounds.  Abdominal:     General: Bowel sounds are normal. There is no distension.     Palpations: Abdomen is soft.  Skin:    General: Skin is warm.  Neurological:     General: No focal deficit present.     Mental Status: He is alert and oriented to person, place, and time.  Psychiatric:        Mood and Affect: Mood normal.        Behavior: Behavior normal.      LABORATORY DATA:  I have reviewed the labs as listed.  CBC    Component Value Date/Time   WBC 6.5 03/12/2019 1105   RBC 3.82 (L) 03/12/2019 1105   HGB 12.4 (L) 03/12/2019 1105   HCT 39.9 03/12/2019 1105   PLT 467 (H) 03/12/2019 1105   MCV 104.5 (H) 03/12/2019 1105   MCH 32.5 03/12/2019 1105   MCHC 31.1 03/12/2019 1105   RDW 15.9 (H)  03/12/2019 1105   LYMPHSABS 1.5 03/12/2019 1105   MONOABS 0.6 03/12/2019 1105   EOSABS 0.1 03/12/2019 1105   BASOSABS 0.1 03/12/2019 1105   CMP Latest Ref Rng & Units 03/12/2019 12/10/2018 09/09/2018  Glucose 70 - 99 mg/dL 184(H) 149(H) 177(H)  BUN 8 - 23 mg/dL 21 18 12  Creatinine 0.61 - 1.24 mg/dL 1.26(H) 1.20 1.25(H)  Sodium 135 - 145 mmol/L 137 137 137  Potassium 3.5 - 5.1 mmol/L 4.0 3.9 4.0  Chloride 98 - 111 mmol/L 106 107 107  CO2 22 - 32 mmol/L 22 21(L) 22  Calcium 8.9 - 10.3 mg/dL 9.5 8.9 9.4  Total Protein 6.5 - 8.1 g/dL 6.7 6.6 6.9  Total Bilirubin 0.3 - 1.2 mg/dL 0.9 0.8 0.7  Alkaline Phos 38 - 126 U/L 39 40 36(L)  AST 15 - 41 U/L 22 20 19   ALT 0 - 44 U/L 31 30 28        DIAGNOSTIC IMAGING:  I have independently reviewed the scans and discussed with the patient.   I have reviewed Venita Lick LPN's note and agree with the documentation.  I personally performed a face-to-face visit, made revisions and my assessment and plan is as follows.    ASSESSMENT & PLAN:   Essential thrombocytosis ([place]) 1.  Essential thrombocytosis, CALR positive: -High risk disease given history of thrombosis with a left leg DVT and age more than 89.  No vasomotor symptoms. -Hydroxyurea started in December 2018, takes 81 mg of aspirin daily.  -Hydroxyurea decreased to 2 tablets daily around April 2019. - He is tolerating hydroxyurea very well. -He reportedly had to have a injection in his left eye.  He was bleeding a lot after the injection.  I have told him to discontinue aspirin 1 week prior to the next injection on April 1.  He will start back aspirin 1 to 2 days after the procedure. - We reviewed his blood work.  His platelet count is remaining slightly above 450.  It was 467.  Patient reports that he had upper respiratory infection and is still taking Augmentin.  Maybe there is a small component of reactive thrombocytosis.  Hematocrit is 39.9. - He will continue hydroxyurea 2 tablets  daily.  I will see him back in 3 months for follow-up.  2.  Diabetes: -He is taking metformin ER 500 mg daily.    3.  Hypertension: -Systolic blood pressure is 123.  He will continue Toprol-XL 50 mg daily.    4.  Right oral mucosal lesion: - He follows up with Dr. Benjamine Mola.  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      Orders placed this encounter:  Orders Placed This Encounter  Procedures  . CBC with Differential/Platelet  . Comprehensive metabolic panel      Justin Jack, MD [place] [phone]

## 2019-03-12 NOTE — Patient Instructions (Addendum)
[place] at [facility] Discharge Instructions  You were seen today by Dr. Delton Coombes. He went over your recent lab results. Continue taking your Hydrea.  He will see you back in 3 months for pabs and follow up.   Thank you for choosing [place] at [facility] to provide your oncology and hematology care.  To afford each patient quality time with our provider, please arrive at least 15 minutes before your scheduled appointment time.   If you have a lab appointment with the [facility] please come in thru the  Main Entrance and check in at the main information desk  You need to re-schedule your appointment should you arrive 10 or more minutes late.  We strive to give you quality time with our providers, and arriving late affects you and other patients whose appointments are after yours.  Also, if you no show three or more times for appointments you may be dismissed from the clinic at the providers discretion.     Again, thank you for choosing [facility].  Our hope is that these requests will decrease the amount of time that you wait before being seen by our physicians.       _____________________________________________________________  Should you have questions after your visit to [facility], please contact our office at [phone] between the hours of 8:00 a.m. and 4:30 p.m.  Voicemails left after 4:00 p.m. will not be returned until the following business day.  For prescription refill requests, have your pharmacy contact our office and allow 72 hours.    Cancer Center Support Programs:   > Cancer Support Group  2nd Tuesday of the month 1pm-2pm, Journey Room

## 2019-03-15 ENCOUNTER — Ambulatory Visit (INDEPENDENT_AMBULATORY_CARE_PROVIDER_SITE_OTHER): Payer: Medicare Other | Admitting: Otolaryngology

## 2019-03-15 DIAGNOSIS — R42 Dizziness and giddiness: Secondary | ICD-10-CM | POA: Diagnosis not present

## 2019-03-15 DIAGNOSIS — D3709 Neoplasm of uncertain behavior of other specified sites of the oral cavity: Secondary | ICD-10-CM | POA: Diagnosis not present

## 2019-04-07 DIAGNOSIS — H2513 Age-related nuclear cataract, bilateral: Secondary | ICD-10-CM | POA: Diagnosis not present

## 2019-04-07 DIAGNOSIS — H34832 Tributary (branch) retinal vein occlusion, left eye, with macular edema: Secondary | ICD-10-CM | POA: Diagnosis not present

## 2019-04-23 DIAGNOSIS — B351 Tinea unguium: Secondary | ICD-10-CM | POA: Diagnosis not present

## 2019-04-23 DIAGNOSIS — M79676 Pain in unspecified toe(s): Secondary | ICD-10-CM | POA: Diagnosis not present

## 2019-05-06 DIAGNOSIS — H34832 Tributary (branch) retinal vein occlusion, left eye, with macular edema: Secondary | ICD-10-CM | POA: Diagnosis not present

## 2019-05-06 DIAGNOSIS — H2513 Age-related nuclear cataract, bilateral: Secondary | ICD-10-CM | POA: Diagnosis not present

## 2019-05-10 DIAGNOSIS — E7800 Pure hypercholesterolemia, unspecified: Secondary | ICD-10-CM | POA: Diagnosis not present

## 2019-05-10 DIAGNOSIS — E1142 Type 2 diabetes mellitus with diabetic polyneuropathy: Secondary | ICD-10-CM | POA: Diagnosis not present

## 2019-05-10 DIAGNOSIS — E1165 Type 2 diabetes mellitus with hyperglycemia: Secondary | ICD-10-CM | POA: Diagnosis not present

## 2019-05-10 DIAGNOSIS — N189 Chronic kidney disease, unspecified: Secondary | ICD-10-CM | POA: Diagnosis not present

## 2019-05-10 DIAGNOSIS — E782 Mixed hyperlipidemia: Secondary | ICD-10-CM | POA: Diagnosis not present

## 2019-05-10 DIAGNOSIS — I10 Essential (primary) hypertension: Secondary | ICD-10-CM | POA: Diagnosis not present

## 2019-05-10 DIAGNOSIS — E7801 Familial hypercholesterolemia: Secondary | ICD-10-CM | POA: Diagnosis not present

## 2019-05-13 DIAGNOSIS — D473 Essential (hemorrhagic) thrombocythemia: Secondary | ICD-10-CM | POA: Diagnosis not present

## 2019-05-13 DIAGNOSIS — E782 Mixed hyperlipidemia: Secondary | ICD-10-CM | POA: Diagnosis not present

## 2019-05-13 DIAGNOSIS — M109 Gout, unspecified: Secondary | ICD-10-CM | POA: Diagnosis not present

## 2019-05-13 DIAGNOSIS — E1142 Type 2 diabetes mellitus with diabetic polyneuropathy: Secondary | ICD-10-CM | POA: Diagnosis not present

## 2019-05-13 DIAGNOSIS — G6289 Other specified polyneuropathies: Secondary | ICD-10-CM | POA: Diagnosis not present

## 2019-05-13 DIAGNOSIS — Z6836 Body mass index (BMI) 36.0-36.9, adult: Secondary | ICD-10-CM | POA: Diagnosis not present

## 2019-05-13 DIAGNOSIS — I10 Essential (primary) hypertension: Secondary | ICD-10-CM | POA: Diagnosis not present

## 2019-06-03 DIAGNOSIS — H34832 Tributary (branch) retinal vein occlusion, left eye, with macular edema: Secondary | ICD-10-CM | POA: Diagnosis not present

## 2019-06-03 DIAGNOSIS — H2513 Age-related nuclear cataract, bilateral: Secondary | ICD-10-CM | POA: Diagnosis not present

## 2019-06-17 ENCOUNTER — Other Ambulatory Visit: Payer: Self-pay

## 2019-06-17 ENCOUNTER — Inpatient Hospital Stay (HOSPITAL_COMMUNITY): Payer: Medicare Other | Attending: Hematology | Admitting: Hematology

## 2019-06-17 ENCOUNTER — Inpatient Hospital Stay (HOSPITAL_COMMUNITY): Payer: Medicare Other

## 2019-06-17 ENCOUNTER — Encounter (HOSPITAL_COMMUNITY): Payer: Self-pay | Admitting: Hematology

## 2019-06-17 VITALS — BP 138/90 | HR 80 | Temp 98.8°F | Resp 18 | Wt 251.0 lb

## 2019-06-17 DIAGNOSIS — E119 Type 2 diabetes mellitus without complications: Secondary | ICD-10-CM | POA: Insufficient documentation

## 2019-06-17 DIAGNOSIS — Z87891 Personal history of nicotine dependence: Secondary | ICD-10-CM | POA: Insufficient documentation

## 2019-06-17 DIAGNOSIS — D473 Essential (hemorrhagic) thrombocythemia: Secondary | ICD-10-CM | POA: Diagnosis not present

## 2019-06-17 DIAGNOSIS — Z7984 Long term (current) use of oral hypoglycemic drugs: Secondary | ICD-10-CM | POA: Diagnosis not present

## 2019-06-17 DIAGNOSIS — I10 Essential (primary) hypertension: Secondary | ICD-10-CM | POA: Insufficient documentation

## 2019-06-17 DIAGNOSIS — R197 Diarrhea, unspecified: Secondary | ICD-10-CM | POA: Diagnosis not present

## 2019-06-17 LAB — CBC WITH DIFFERENTIAL/PLATELET
Abs Immature Granulocytes: 0.28 10*3/uL — ABNORMAL HIGH (ref 0.00–0.07)
Basophils Absolute: 0.1 10*3/uL (ref 0.0–0.1)
Basophils Relative: 2 %
Eosinophils Absolute: 0.1 10*3/uL (ref 0.0–0.5)
Eosinophils Relative: 1 %
HCT: 39.6 % (ref 39.0–52.0)
Hemoglobin: 12.8 g/dL — ABNORMAL LOW (ref 13.0–17.0)
Immature Granulocytes: 5 %
Lymphocytes Relative: 20 %
Lymphs Abs: 1.2 10*3/uL (ref 0.7–4.0)
MCH: 33.4 pg (ref 26.0–34.0)
MCHC: 32.3 g/dL (ref 30.0–36.0)
MCV: 103.4 fL — ABNORMAL HIGH (ref 80.0–100.0)
Monocytes Absolute: 0.5 10*3/uL (ref 0.1–1.0)
Monocytes Relative: 9 %
Neutro Abs: 3.8 10*3/uL (ref 1.7–7.7)
Neutrophils Relative %: 63 %
Platelets: 522 10*3/uL — ABNORMAL HIGH (ref 150–400)
RBC: 3.83 MIL/uL — ABNORMAL LOW (ref 4.22–5.81)
RDW: 15.6 % — ABNORMAL HIGH (ref 11.5–15.5)
WBC: 5.9 10*3/uL (ref 4.0–10.5)
nRBC: 0.3 % — ABNORMAL HIGH (ref 0.0–0.2)

## 2019-06-17 LAB — COMPREHENSIVE METABOLIC PANEL
ALT: 25 U/L (ref 0–44)
AST: 21 U/L (ref 15–41)
Albumin: 4.5 g/dL (ref 3.5–5.0)
Alkaline Phosphatase: 37 U/L — ABNORMAL LOW (ref 38–126)
Anion gap: 9 (ref 5–15)
BUN: 17 mg/dL (ref 8–23)
CO2: 23 mmol/L (ref 22–32)
Calcium: 9.4 mg/dL (ref 8.9–10.3)
Chloride: 106 mmol/L (ref 98–111)
Creatinine, Ser: 1.22 mg/dL (ref 0.61–1.24)
GFR calc Af Amer: >60 mL/min (ref >60)
GFR calc non Af Amer: >60 mL/min (ref >60)
Glucose, Bld: 142 mg/dL — ABNORMAL HIGH (ref 70–99)
Potassium: 4 mmol/L (ref 3.5–5.1)
Sodium: 138 mmol/L (ref 135–145)
Total Bilirubin: 0.9 mg/dL (ref 0.3–1.2)
Total Protein: 6.9 g/dL (ref 6.5–8.1)

## 2019-06-17 NOTE — Patient Instructions (Addendum)
[place] at [facility] Discharge Instructions  You were seen today by Dr. Delton Coombes. He went over your recent lab results. He will see you back in 3 months for labs and follow up. Start taking 3 pills on Monday Wednesday and Friday and take 2 pills on all other days.  Thank you for choosing [place] at [facility] to provide your oncology and hematology care.  To afford each patient quality time with our provider, please arrive at least 15 minutes before your scheduled appointment time.   If you have a lab appointment with the [facility] please come in thru the  Main Entrance and check in at the main information desk  You need to re-schedule your appointment should you arrive 10 or more minutes late.  We strive to give you quality time with our providers, and arriving late affects you and other patients whose appointments are after yours.  Also, if you no show three or more times for appointments you may be dismissed from the clinic at the providers discretion.     Again, thank you for choosing [facility].  Our hope is that these requests will decrease the amount of time that you wait before being seen by our physicians.       _____________________________________________________________  Should you have questions after your visit to [facility], please contact our office at [phone] between the hours of 8:00 a.m. and 4:30 p.m.  Voicemails left after 4:00 p.m. will not be returned until the following business day.  For prescription refill requests, have your pharmacy contact our office and allow 72 hours.    Cancer Center Support Programs:   > Cancer Support Group  2nd Tuesday of the month 1pm-2pm, Journey Room

## 2019-06-17 NOTE — Progress Notes (Signed)
Justin Berger, Justin Berger [id number]   CLINIC:  Medical Oncology/Hematology  PCP:  Caryl Bis, MD [place] [address] [phone]   REASON FOR VISIT:  Follow-up for Essential thrombocytosis, CALR positive  CURRENT THERAPY: Hydroxyurea  BRIEF ONCOLOGIC HISTORY:  Oncology History  Essential thrombocytosis ([place])  08/31/2011 Cancer Diagnosis   History of colon cancer, status post resection in 2012, status post adjuvant chemotherapy with 6 months of 5-FU and leucovorin under the direction of Dr.Karb at [facility]   09/29/2017 Imaging   CT scan of the abdomen done for nonspecific abdominal pain shows incidental splenomegaly  Patient referred to [place] for further workup   10/30/2017 Genetic Testing   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   INTERVAL HISTORY:  Justin Berger 68 y.o. male returns for follow-up of his essential thrombocytosis.  Denies any aquagenic pruritus.  Denies any erythromelalgia's.  Denies any vasomotor symptoms.  Denies any nausea vomiting or constipation.  Denies any bleeding per rectum or melena.  No skin ulcers were reported.  No ER visits or hospitalizations.  Has occasional diarrhea.  Appetite is 100%.  Energy levels are 50%.  No pain reported.     REVIEW OF SYSTEMS:  Review of Systems  Eyes: Positive for eye problems.  Gastrointestinal: Positive for diarrhea.  All other systems reviewed and  are negative.    PAST MEDICAL/SURGICAL HISTORY:  Past Medical History:  Diagnosis Date  . Diabetes ([place])   . Hypertension    History reviewed. No pertinent surgical history.   SOCIAL HISTORY:  Social History   Socioeconomic History  . Marital status: Married    Spouse name: Not on file  . Number of children: Not on file  . Years of education: Not on file  . Highest education level: Not on file  Occupational History  . Not on file  Social Needs  . Financial resource strain: Not on file  . Food insecurity    Worry: Not on file    Inability: Not on file  . Transportation needs    Medical: Not on file    Non-medical: Not on file  Tobacco Use  . Smoking status: Former Smoker    Packs/day: 0.50    Years: 3.00    Pack years: 1.50    Types: Cigarettes    Quit date: 04/08/1978    Years since quitting: 41.2  . Smokeless tobacco: Never Used  Substance and Sexual Activity  . Alcohol use: Never    Frequency: Never  . Drug use: Never  . Sexual activity: Not on file  Lifestyle  . Physical activity    Days per week: Not on file    Minutes per session: Not on file  . Stress: Not on file  Relationships  . Social Herbalist on phone: Not on file    Gets together: Not on file    Attends religious service: Not on file    Active member of club or organization: Not  on file    Attends meetings of clubs or organizations: Not on file    Relationship status: Not on file  . Intimate partner violence    Fear of current or ex partner: Not on file    Emotionally abused: Not on file    Physically abused: Not on file    Forced sexual activity: Not on file  Other Topics Concern  . Not on file  Social History Narrative  . Not on file    FAMILY HISTORY:  Family History  Problem Relation Age of Onset  . Dementia Mother   . Cancer Paternal Uncle        lung    CURRENT MEDICATIONS:  Outpatient Encounter Medications as of 06/17/2019  Medication Sig  . cyclobenzaprine  (FLEXERIL) 10 MG tablet Take by mouth.  . hydroxyurea (HYDREA) 500 MG capsule TK 2 CS PO D  . metFORMIN (GLUCOPHAGE-XR) 500 MG 24 hr tablet TK 1 T PO QD  . metoprolol succinate (TOPROL-XL) 50 MG 24 hr tablet TK 1 T PO QD  . promethazine (PHENERGAN) 12.5 MG tablet TAKE 1-2 TABLETS PO BID PRN  . ACCU-CHEK AVIVA PLUS test strip TEST BLOOD SUGAR D   No facility-administered encounter medications on file as of 06/17/2019.     ALLERGIES:  Allergies  Allergen Reactions  . Ivp Dye  [Iodinated Diagnostic Agents]      PHYSICAL EXAM:  ECOG Performance status: 1  Vitals:   06/17/19 1421  BP: 138/90  Pulse: 80  Resp: 18  Temp: 98.8 F (37.1 C)  SpO2: 96%   Filed Weights   06/17/19 1421  Weight: 251 lb (113.9 kg)    Physical Exam Constitutional:      Appearance: Normal appearance.  Cardiovascular:     Rate and Rhythm: Normal rate and regular rhythm.  Pulmonary:     Effort: Pulmonary effort is normal.     Breath sounds: Normal breath sounds.  Abdominal:     General: Bowel sounds are normal. There is no distension.     Palpations: Abdomen is soft.  Skin:    General: Skin is warm.  Neurological:     General: No focal deficit present.     Mental Status: He is alert and oriented to person, place, and time.  Psychiatric:        Mood and Affect: Mood normal.        Behavior: Behavior normal.      LABORATORY DATA:  I have reviewed the labs as listed.  CBC    Component Value Date/Time   WBC 5.9 06/17/2019 1356   RBC 3.83 (L) 06/17/2019 1356   HGB 12.8 (L) 06/17/2019 1356   HCT 39.6 06/17/2019 1356   PLT 522 (H) 06/17/2019 1356   MCV 103.4 (H) 06/17/2019 1356   MCH 33.4 06/17/2019 1356   MCHC 32.3 06/17/2019 1356   RDW 15.6 (H) 06/17/2019 1356   LYMPHSABS 1.2 06/17/2019 1356   MONOABS 0.5 06/17/2019 1356   EOSABS 0.1 06/17/2019 1356   BASOSABS 0.1 06/17/2019 1356   CMP Latest Ref Rng & Units 06/17/2019 03/12/2019 12/10/2018  Glucose 70 - 99 mg/dL 142(H) 184(H) 149(H)   BUN 8 - 23 mg/dL _0 Creatinine 0.61 - 1.24 mg/dL 1.22 1.26(H) 1.20  Sodium 135 - 145 mmol/L 138 137 137  Potassium 3.5 - 5.1 mmol/L 4.0 4.0 3.9  Chloride 98 - 111 mmol/L 106 106 107  CO2 22 - 32 mmol/L 23 22 21(L)  Calcium 8.9 -  10.3 mg/dL 9.4 9.5 8.9  Total Protein 6.5 - 8.1 g/dL 6.9 6.7 6.6  Total Bilirubin 0.3 - 1.2 mg/dL 0.9 0.9 0.8  Alkaline Phos 38 - 126 U/L 37(L) 39 40  AST 15 - 41 U/L _0 ALT 0 - 44 U/L _1 DIAGNOSTIC IMAGING:  I have independently reviewed the scans and discussed with the patient.   I have reviewed Venita Lick LPN's note and agree with the documentation.  I personally performed a face-to-face visit, made revisions and my assessment and plan is as follows.    ASSESSMENT & PLAN:   Essential thrombocytosis ([place]) 1.  Essential thrombocytosis, CALR positive: -High risk disease given history of thrombosis with a left leg DVT and age more than 55.  No vasomotor symptoms. -Hydroxyurea started in December 2018, takes 81 mg of aspirin daily.  -Hydroxyurea decreased to 2 tablets daily around April 2019. - Denies any fevers, night sweats or weight loss in the last 3 to 6 months.  He has some injections done in his eyes.  He denies any vasomotor symptoms or erythromelalgia. - I reviewed his blood work.  Platelet count increased to 522. -I have recommended him to increase hydroxyurea to 3 tablets on Mondays, Wednesdays and Fridays and 2 tablets rest of the week. -We will see him back in 3 months and repeat his blood work.  2.  Diabetes: -He is taking metformin ER 500 mg daily.  3.  Hypertension: - Blood pressure today is 138/90.  I will continue Toprol-XL 50 mg daily.  4.  Right oral mucosal lesion: - He follows up with Dr. Benjamine Mola.  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   Total time spent is 25 minutes with more than 50% of the time spent face-to-face discussing treatment changes,  coordination of care.  Orders placed this encounter:  Orders Placed This Encounter  Procedures  . CBC with Differential/Platelet  . Comprehensive metabolic panel  . Lactate dehydrogenase      Derek Jack, MD [place] [phone]

## 2019-06-17 NOTE — Assessment & Plan Note (Signed)
1.  Essential thrombocytosis, CALR positive: -High risk disease given history of thrombosis with a left leg DVT and age more than 62.  No vasomotor symptoms. -Hydroxyurea started in December 2018, takes 81 mg of aspirin daily.  -Hydroxyurea decreased to 2 tablets daily around April 2019. - Denies any fevers, night sweats or weight loss in the last 3 to 6 months.  He has some injections done in his eyes.  He denies any vasomotor symptoms or erythromelalgia. - I reviewed his blood work.  Platelet count increased to 522. -I have recommended him to increase hydroxyurea to 3 tablets on Mondays, Wednesdays and Fridays and 2 tablets rest of the week. -We will see him back in 3 months and repeat his blood work.  2.  Diabetes: -He is taking metformin ER 500 mg daily.  3.  Hypertension: - Blood pressure today is 138/90.  I will continue Toprol-XL 50 mg daily.  4.  Right oral mucosal lesion: - He follows up with Dr. Benjamine Mola.  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

## 2019-06-18 ENCOUNTER — Ambulatory Visit (HOSPITAL_COMMUNITY): Payer: 59 | Admitting: Hematology

## 2019-06-18 ENCOUNTER — Other Ambulatory Visit (HOSPITAL_COMMUNITY): Payer: 59

## 2019-06-22 ENCOUNTER — Telehealth (HOSPITAL_COMMUNITY): Payer: Self-pay | Admitting: *Deleted

## 2019-06-22 NOTE — Telephone Encounter (Signed)
Pt called into clinic wanting to know about his lab results. Provider looked over labs and stated labs are within normal limits. Called pt back to let him know labs are within normal limits and to continue to take Hydrea as prescribed per provider.

## 2019-06-30 DIAGNOSIS — H34832 Tributary (branch) retinal vein occlusion, left eye, with macular edema: Secondary | ICD-10-CM | POA: Diagnosis not present

## 2019-06-30 DIAGNOSIS — H2513 Age-related nuclear cataract, bilateral: Secondary | ICD-10-CM | POA: Diagnosis not present

## 2019-07-27 DIAGNOSIS — H34832 Tributary (branch) retinal vein occlusion, left eye, with macular edema: Secondary | ICD-10-CM | POA: Diagnosis not present

## 2019-08-13 DIAGNOSIS — M79676 Pain in unspecified toe(s): Secondary | ICD-10-CM | POA: Diagnosis not present

## 2019-08-13 DIAGNOSIS — B351 Tinea unguium: Secondary | ICD-10-CM | POA: Diagnosis not present

## 2019-08-23 DIAGNOSIS — Z23 Encounter for immunization: Secondary | ICD-10-CM | POA: Diagnosis not present

## 2019-08-26 DIAGNOSIS — H34832 Tributary (branch) retinal vein occlusion, left eye, with macular edema: Secondary | ICD-10-CM | POA: Diagnosis not present

## 2019-09-09 DIAGNOSIS — E1142 Type 2 diabetes mellitus with diabetic polyneuropathy: Secondary | ICD-10-CM | POA: Diagnosis not present

## 2019-09-09 DIAGNOSIS — I10 Essential (primary) hypertension: Secondary | ICD-10-CM | POA: Diagnosis not present

## 2019-09-09 DIAGNOSIS — D519 Vitamin B12 deficiency anemia, unspecified: Secondary | ICD-10-CM | POA: Diagnosis not present

## 2019-09-09 DIAGNOSIS — E1165 Type 2 diabetes mellitus with hyperglycemia: Secondary | ICD-10-CM | POA: Diagnosis not present

## 2019-09-09 DIAGNOSIS — E782 Mixed hyperlipidemia: Secondary | ICD-10-CM | POA: Diagnosis not present

## 2019-09-09 DIAGNOSIS — D649 Anemia, unspecified: Secondary | ICD-10-CM | POA: Diagnosis not present

## 2019-09-09 DIAGNOSIS — D529 Folate deficiency anemia, unspecified: Secondary | ICD-10-CM | POA: Diagnosis not present

## 2019-09-09 DIAGNOSIS — E7801 Familial hypercholesterolemia: Secondary | ICD-10-CM | POA: Diagnosis not present

## 2019-09-09 DIAGNOSIS — E7800 Pure hypercholesterolemia, unspecified: Secondary | ICD-10-CM | POA: Diagnosis not present

## 2019-09-13 DIAGNOSIS — Z6837 Body mass index (BMI) 37.0-37.9, adult: Secondary | ICD-10-CM | POA: Diagnosis not present

## 2019-09-13 DIAGNOSIS — G252 Other specified forms of tremor: Secondary | ICD-10-CM | POA: Diagnosis not present

## 2019-09-13 DIAGNOSIS — G6289 Other specified polyneuropathies: Secondary | ICD-10-CM | POA: Diagnosis not present

## 2019-09-13 DIAGNOSIS — E782 Mixed hyperlipidemia: Secondary | ICD-10-CM | POA: Diagnosis not present

## 2019-09-13 DIAGNOSIS — I10 Essential (primary) hypertension: Secondary | ICD-10-CM | POA: Diagnosis not present

## 2019-09-13 DIAGNOSIS — D473 Essential (hemorrhagic) thrombocythemia: Secondary | ICD-10-CM | POA: Diagnosis not present

## 2019-09-13 DIAGNOSIS — E1142 Type 2 diabetes mellitus with diabetic polyneuropathy: Secondary | ICD-10-CM | POA: Diagnosis not present

## 2019-09-13 DIAGNOSIS — M109 Gout, unspecified: Secondary | ICD-10-CM | POA: Diagnosis not present

## 2019-09-13 DIAGNOSIS — Z9189 Other specified personal risk factors, not elsewhere classified: Secondary | ICD-10-CM | POA: Diagnosis not present

## 2019-09-20 ENCOUNTER — Inpatient Hospital Stay (HOSPITAL_COMMUNITY): Payer: Medicare Other | Attending: Hematology

## 2019-09-20 ENCOUNTER — Inpatient Hospital Stay (HOSPITAL_BASED_OUTPATIENT_CLINIC_OR_DEPARTMENT_OTHER): Payer: Medicare Other | Admitting: Hematology

## 2019-09-20 ENCOUNTER — Encounter (HOSPITAL_COMMUNITY): Payer: Self-pay | Admitting: Hematology

## 2019-09-20 ENCOUNTER — Other Ambulatory Visit: Payer: Self-pay

## 2019-09-20 VITALS — BP 125/79 | HR 67 | Temp 97.1°F | Resp 14 | Wt 257.7 lb

## 2019-09-20 DIAGNOSIS — R2681 Unsteadiness on feet: Secondary | ICD-10-CM | POA: Insufficient documentation

## 2019-09-20 DIAGNOSIS — D473 Essential (hemorrhagic) thrombocythemia: Secondary | ICD-10-CM

## 2019-09-20 DIAGNOSIS — Z87891 Personal history of nicotine dependence: Secondary | ICD-10-CM | POA: Insufficient documentation

## 2019-09-20 DIAGNOSIS — I10 Essential (primary) hypertension: Secondary | ICD-10-CM | POA: Insufficient documentation

## 2019-09-20 DIAGNOSIS — Z9181 History of falling: Secondary | ICD-10-CM | POA: Insufficient documentation

## 2019-09-20 LAB — COMPREHENSIVE METABOLIC PANEL
ALT: 30 U/L (ref 0–44)
AST: 20 U/L (ref 15–41)
Albumin: 4.4 g/dL (ref 3.5–5.0)
Alkaline Phosphatase: 34 U/L — ABNORMAL LOW (ref 38–126)
Anion gap: 7 (ref 5–15)
BUN: 11 mg/dL (ref 8–23)
CO2: 23 mmol/L (ref 22–32)
Calcium: 9.1 mg/dL (ref 8.9–10.3)
Chloride: 108 mmol/L (ref 98–111)
Creatinine, Ser: 1.07 mg/dL (ref 0.61–1.24)
GFR calc Af Amer: >60 mL/min (ref >60)
GFR calc non Af Amer: >60 mL/min (ref >60)
Glucose, Bld: 149 mg/dL — ABNORMAL HIGH (ref 70–99)
Potassium: 4.2 mmol/L (ref 3.5–5.1)
Sodium: 138 mmol/L (ref 135–145)
Total Bilirubin: 0.8 mg/dL (ref 0.3–1.2)
Total Protein: 6.7 g/dL (ref 6.5–8.1)

## 2019-09-20 LAB — LACTATE DEHYDROGENASE: LDH: 202 U/L — ABNORMAL HIGH (ref 98–192)

## 2019-09-20 LAB — CBC WITH DIFFERENTIAL/PLATELET
Abs Immature Granulocytes: 0.16 10*3/uL — ABNORMAL HIGH (ref 0.00–0.07)
Basophils Absolute: 0.1 10*3/uL (ref 0.0–0.1)
Basophils Relative: 1 %
Eosinophils Absolute: 0.1 10*3/uL (ref 0.0–0.5)
Eosinophils Relative: 1 %
HCT: 37.9 % — ABNORMAL LOW (ref 39.0–52.0)
Hemoglobin: 12.4 g/dL — ABNORMAL LOW (ref 13.0–17.0)
Immature Granulocytes: 4 %
Lymphocytes Relative: 27 %
Lymphs Abs: 1.1 10*3/uL (ref 0.7–4.0)
MCH: 34.4 pg — ABNORMAL HIGH (ref 26.0–34.0)
MCHC: 32.7 g/dL (ref 30.0–36.0)
MCV: 105.3 fL — ABNORMAL HIGH (ref 80.0–100.0)
Monocytes Absolute: 0.4 10*3/uL (ref 0.1–1.0)
Monocytes Relative: 10 %
Neutro Abs: 2.4 10*3/uL (ref 1.7–7.7)
Neutrophils Relative %: 57 %
Platelets: 484 10*3/uL — ABNORMAL HIGH (ref 150–400)
RBC: 3.6 MIL/uL — ABNORMAL LOW (ref 4.22–5.81)
RDW: 17.4 % — ABNORMAL HIGH (ref 11.5–15.5)
WBC: 4.3 10*3/uL (ref 4.0–10.5)
nRBC: 0 % (ref 0.0–0.2)

## 2019-09-20 NOTE — Progress Notes (Signed)
[place], [place] [address]   CLINIC:  Medical Oncology/Hematology  PCP:  Caryl Bis, MD [place] [address] [phone]   REASON FOR VISIT:  Follow-up for Essential thrombocytosis, CALR positive  CURRENT THERAPY: Hydroxyurea  BRIEF ONCOLOGIC HISTORY:  Oncology History  Essential thrombocytosis ([place])  08/31/2011 Cancer Diagnosis   History of colon cancer, status post resection in 2012, status post adjuvant chemotherapy with 6 months of 5-FU and leucovorin under the direction of Dr.Karb at [facility]   09/29/2017 Imaging   CT scan of the abdomen done for nonspecific abdominal pain shows incidental splenomegaly  Patient referred to [place] for further workup   10/30/2017 Genetic Testing   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   INTERVAL HISTORY:  Justin Berger 68 y.o. male seen for follow-up of essential thrombocytosis.  Denies any erythromelalgia's or aquagenic pruritus.  Complains of feeling tired by the end of the day.  He is taking hydroxyurea 3 tablets on Mondays, Wednesdays and Fridays and 2 tablets rest of the week.  Blood pressure is reportedly staying stable at home.  He reported balance problems when he stands up and tries to walk.  He reported a fall 2 months ago.  Denies any nausea, vomiting, diarrhea or constipation.  Denies any recurrent infections.  Appetite is 100%.  Energy levels are low.     REVIEW OF SYSTEMS:  Review of Systems  Constitutional: Positive for fatigue.  All other systems reviewed and are negative.    PAST MEDICAL/SURGICAL HISTORY:  Past Medical History:  Diagnosis Date  . Diabetes ([place])   . Hypertension    No past surgical history on file.   SOCIAL HISTORY:  Social History   Socioeconomic History  . Marital status: Married    Spouse name: Not on file  . Number of children: Not on file  . Years of education: Not on file  . Highest education level: Not on file  Occupational History  . Not on file  Social Needs  . Financial resource strain: Not on file  . Food insecurity    Worry: Not on file    Inability: Not on file  . Transportation needs    Medical: Not on file    Non-medical: Not on file  Tobacco Use  . Smoking status: Former Smoker    Packs/day: 0.50    Years: 3.00    Pack years: 1.50    Types: Cigarettes    Quit date: 04/08/1978    Years since quitting: 41.4  . Smokeless tobacco: Never Used  Substance and Sexual Activity  . Alcohol use: Never    Frequency: Never  . Drug use: Never  . Sexual activity: Not on file  Lifestyle  . Physical activity    Days per week: Not on file    Minutes per session: Not on file  . Stress: Not on file  Relationships  . Social Herbalist on phone: Not on file    Gets  together: Not on file    Attends religious service: Not on file    Active member of club or organization: Not on file    Attends meetings of clubs or organizations: Not on file    Relationship status: Not on file  . Intimate partner violence    Fear of current or ex partner: Not on file    Emotionally abused: Not on file    Physically abused: Not on file    Forced sexual activity: Not on file  Other Topics Concern  . Not on file  Social History Narrative  . Not on file    FAMILY HISTORY:  Family History  Problem Relation Age of Onset  . Dementia Mother   . Cancer Paternal Uncle        lung    CURRENT  MEDICATIONS:  Outpatient Encounter Medications as of 09/20/2019  Medication Sig  . ACCU-CHEK AVIVA PLUS test strip TEST BLOOD SUGAR D  . hydroxyurea (HYDREA) 500 MG capsule TK 2 CS PO D (Patient taking differently: Take 500 mg by mouth daily. Take 3 tablets Monday,Wednesday and Friday. Take 2 tablets Tuesday,Thursday,Saturday, and Sunday)  . metFORMIN (GLUCOPHAGE-XR) 500 MG 24 hr tablet Take 500 mg by mouth daily.   . metoprolol succinate (TOPROL-XL) 50 MG 24 hr tablet Take 50 mg by mouth daily.   . cyclobenzaprine (FLEXERIL) 10 MG tablet Take 10 mg by mouth as needed.   . promethazine (PHENERGAN) 12.5 MG tablet Take 12.5 mg by mouth as needed.    No facility-administered encounter medications on file as of 09/20/2019.     ALLERGIES:  Allergies  Allergen Reactions  . Ivp Dye  [Iodinated Diagnostic Agents]      PHYSICAL EXAM:  ECOG Performance status: 1  Vitals:   09/20/19 1121  BP: 125/79  Pulse: 67  Resp: 14  Temp: (!) 97.1 F (36.2 C)  SpO2: 99%   Filed Weights   09/20/19 1121  Weight: 257 lb 11.2 oz (116.9 kg)    Physical Exam Constitutional:      Appearance: Normal appearance.  Cardiovascular:     Rate and Rhythm: Normal rate and regular rhythm.  Pulmonary:     Effort: Pulmonary effort is normal.     Breath sounds: Normal breath sounds.  Abdominal:     General: Bowel sounds are normal. There is no distension.     Palpations: Abdomen is soft. There is no mass.  Musculoskeletal:        General: No swelling.  Lymphadenopathy:     Cervical: No cervical adenopathy.  Skin:    General: Skin is warm.  Neurological:     General: No focal deficit present.     Mental Status: He is alert and oriented to person, place, and time.  Psychiatric:        Mood and Affect: Mood normal.        Behavior: Behavior normal.      LABORATORY DATA:  I have reviewed the labs as listed.  CBC    Component Value Date/Time   WBC 4.3 09/20/2019 1037   RBC 3.60 (L) 09/20/2019  1037   HGB 12.4 (L) 09/20/2019 1037   HCT 37.9 (L) 09/20/2019 1037   PLT 484 (H) 09/20/2019 1037   MCV 105.3 (H) 09/20/2019 1037   MCH 34.4 (H) 09/20/2019 1037   MCHC 32.7 09/20/2019 1037   RDW 17.4 (H) 09/20/2019 1037   LYMPHSABS 1.1 09/20/2019 1037   MONOABS 0.4 09/20/2019 1037   EOSABS 0.1  09/20/2019 1037   BASOSABS 0.1 09/20/2019 1037   CMP Latest Ref Rng & Units 09/20/2019 06/17/2019 03/12/2019  Glucose 70 - 99 mg/dL 149(H) 142(H) 184(H)  BUN 8 - 23 mg/dL _0 Creatinine 0.61 - 1.24 mg/dL 1.07 1.22 1.26(H)  Sodium 135 - 145 mmol/L 138 138 137  Potassium 3.5 - 5.1 mmol/L 4.2 4.0 4.0  Chloride 98 - 111 mmol/L 108 106 106  CO2 22 - 32 mmol/L _1 Calcium 8.9 - 10.3 mg/dL 9.1 9.4 9.5  Total Protein 6.5 - 8.1 g/dL 6.7 6.9 6.7  Total Bilirubin 0.3 - 1.2 mg/dL 0.8 0.9 0.9  Alkaline Phos 38 - 126 U/L 34(L) 37(L) 39  AST 15 - 41 U/L _2 ALT 0 - 44 U/L _3 DIAGNOSTIC IMAGING:  I have independently reviewed the scans and discussed with the patient.   I have reviewed Venita Lick LPN's note and agree with the documentation.  I personally performed a face-to-face visit, made revisions and my assessment and plan is as follows.    ASSESSMENT & PLAN:   Essential thrombocytosis ([place]) 1.  Essential thrombocytosis, CALR positive: - High risk disease with history of thrombosis (left leg DVT) and age more than 4. - No vasomotor symptoms.  No aquagenic pruritus.  Hydroxyurea started in December 2018.  He is on aspirin 81 mg daily. - At last visit on 06/17/2019, hydroxyurea increased to 3 tablets on Mondays, Wednesdays and Fridays and 2 tablets rest of the week. - He is tolerating it very well.  Platelet count improved to 484 from 522.  Hemoglobin is 12.8. - He complains of fatigue particularly in the evenings.  Likely coming from [facility].  Hence I would not make any dose increase at this time. -We will reevaluate him in 3 months.  2.  Balance problems: - He  complains of balance problems when he stands up and about to walk.  Blood pressure is normal.  He was evaluated by Dr. 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  Vestibular neurodiagnostic testing was recommended for possible benefit from rehab. -I have asked the patient to reach out to Dr. Benjamine Mola for further work-up.  3.  Hypertension: -Blood pressure is 125/79.  He will continue Toprol-XL 50 mg daily.  4.  Right oral mucosal lesion: -He follows up with Dr. Leonarda Salon.  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   Total time spent is 25 minutes with more than 50% of the time spent face-to-face discussing treatment plan, counseling and coordination of care.  Orders placed this encounter:  Orders Placed This Encounter  Procedures  . CBC with Differential/Platelet  . Comprehensive metabolic panel  . Lactate dehydrogenase      Derek Jack, MD [place] [phone]

## 2019-09-20 NOTE — Assessment & Plan Note (Signed)
1.  Essential thrombocytosis, CALR positive: - High risk disease with history of thrombosis (left leg DVT) and age more than 55. - No vasomotor symptoms.  No aquagenic pruritus.  Hydroxyurea started in December 2018.  He is on aspirin 81 mg daily. - At last visit on 06/17/2019, hydroxyurea increased to 3 tablets on Mondays, Wednesdays and Fridays and 2 tablets rest of the week. - He is tolerating it very well.  Platelet count improved to 484 from 522.  Hemoglobin is 12.8. - He complains of fatigue particularly in the evenings.  Likely coming from [facility].  Hence I would not make any dose increase at this time. -We will reevaluate him in 3 months.  2.  Balance problems: - He complains of balance problems when he stands up and about to walk.  Blood pressure is normal.  He was evaluated by Dr. 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  Vestibular neurodiagnostic testing was recommended for possible benefit from rehab. -I have asked the patient to reach out to Dr. Benjamine Mola for further work-up.  3.  Hypertension: -Blood pressure is 125/79.  He will continue Toprol-XL 50 mg daily.  4.  Right oral mucosal lesion: -He follows up with Dr. Leonarda Salon.  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

## 2019-09-20 NOTE — Patient Instructions (Addendum)
[facility] at [facility] Discharge Instructions  You were seen today by Dr. Katragadda. He went over your recent lab results. He will see you back in 3 months for labs and follow up.   Thank you for choosing [facility] at [facility] to provide your oncology and hematology care.  To afford each patient quality time with our provider, please arrive at least 15 minutes before your scheduled appointment time.   If you have a lab appointment with the Cancer Center please come in thru the  Main Entrance and check in at the main information desk  You need to re-schedule your appointment should you arrive 10 or more minutes late.  We strive to give you quality time with our providers, and arriving late affects you and other patients whose appointments are after yours.  Also, if you no show three or more times for appointments you may be dismissed from the clinic at the providers discretion.     Again, thank you for choosing [facility].  Our hope is that these requests will decrease the amount of time that you wait before being seen by our physicians.       _____________________________________________________________  Should you have questions after your visit to [facility], please contact our office at [phone] between the hours of 8:00 a.m. and 4:30 p.m.  Voicemails left after 4:00 p.m. will not be returned until the following business day.  For prescription refill requests, have your pharmacy contact our office and allow 72 hours.    Cancer Center Support Programs:   > Cancer Support Group  2nd Tuesday of the month 1pm-2pm, Journey Room    

## 2019-09-27 DIAGNOSIS — H34832 Tributary (branch) retinal vein occlusion, left eye, with macular edema: Secondary | ICD-10-CM | POA: Diagnosis not present

## 2019-10-12 ENCOUNTER — Other Ambulatory Visit (HOSPITAL_COMMUNITY): Payer: Self-pay | Admitting: Hematology

## 2019-10-12 DIAGNOSIS — D473 Essential (hemorrhagic) thrombocythemia: Secondary | ICD-10-CM

## 2019-10-18 DIAGNOSIS — M25561 Pain in right knee: Secondary | ICD-10-CM | POA: Diagnosis not present

## 2019-10-18 DIAGNOSIS — Z6837 Body mass index (BMI) 37.0-37.9, adult: Secondary | ICD-10-CM | POA: Diagnosis not present

## 2019-10-18 DIAGNOSIS — M109 Gout, unspecified: Secondary | ICD-10-CM | POA: Diagnosis not present

## 2019-10-19 DIAGNOSIS — M79604 Pain in right leg: Secondary | ICD-10-CM | POA: Diagnosis not present

## 2019-10-19 DIAGNOSIS — M7989 Other specified soft tissue disorders: Secondary | ICD-10-CM | POA: Diagnosis not present

## 2019-10-25 DIAGNOSIS — H2513 Age-related nuclear cataract, bilateral: Secondary | ICD-10-CM | POA: Diagnosis not present

## 2019-10-25 DIAGNOSIS — H34832 Tributary (branch) retinal vein occlusion, left eye, with macular edema: Secondary | ICD-10-CM | POA: Diagnosis not present

## 2019-11-22 DIAGNOSIS — H34832 Tributary (branch) retinal vein occlusion, left eye, with macular edema: Secondary | ICD-10-CM | POA: Diagnosis not present

## 2019-12-21 ENCOUNTER — Inpatient Hospital Stay (HOSPITAL_COMMUNITY): Payer: Medicare Other | Attending: Hematology | Admitting: Hematology

## 2019-12-21 ENCOUNTER — Inpatient Hospital Stay (HOSPITAL_COMMUNITY): Payer: Medicare Other

## 2019-12-21 ENCOUNTER — Other Ambulatory Visit: Payer: Self-pay

## 2019-12-21 DIAGNOSIS — Z87891 Personal history of nicotine dependence: Secondary | ICD-10-CM | POA: Diagnosis not present

## 2019-12-21 DIAGNOSIS — Z7982 Long term (current) use of aspirin: Secondary | ICD-10-CM | POA: Diagnosis not present

## 2019-12-21 DIAGNOSIS — D473 Essential (hemorrhagic) thrombocythemia: Secondary | ICD-10-CM

## 2019-12-21 LAB — COMPREHENSIVE METABOLIC PANEL
ALT: 30 U/L (ref 0–44)
AST: 21 U/L (ref 15–41)
Albumin: 4.8 g/dL (ref 3.5–5.0)
Alkaline Phosphatase: 32 U/L — ABNORMAL LOW (ref 38–126)
Anion gap: 11 (ref 5–15)
BUN: 16 mg/dL (ref 8–23)
CO2: 23 mmol/L (ref 22–32)
Calcium: 9.4 mg/dL (ref 8.9–10.3)
Chloride: 102 mmol/L (ref 98–111)
Creatinine, Ser: 1.18 mg/dL (ref 0.61–1.24)
GFR calc Af Amer: >60 mL/min (ref >60)
GFR calc non Af Amer: >60 mL/min (ref >60)
Glucose, Bld: 182 mg/dL — ABNORMAL HIGH (ref 70–99)
Potassium: 4.1 mmol/L (ref 3.5–5.1)
Sodium: 136 mmol/L (ref 135–145)
Total Bilirubin: 0.9 mg/dL (ref 0.3–1.2)
Total Protein: 7 g/dL (ref 6.5–8.1)

## 2019-12-21 LAB — LACTATE DEHYDROGENASE: LDH: 200 U/L — ABNORMAL HIGH (ref 98–192)

## 2019-12-21 LAB — CBC WITH DIFFERENTIAL/PLATELET
Abs Immature Granulocytes: 0.15 10*3/uL — ABNORMAL HIGH (ref 0.00–0.07)
Basophils Absolute: 0.1 10*3/uL (ref 0.0–0.1)
Basophils Relative: 2 %
Eosinophils Absolute: 0.1 10*3/uL (ref 0.0–0.5)
Eosinophils Relative: 1 %
HCT: 39.5 % (ref 39.0–52.0)
Hemoglobin: 12.8 g/dL — ABNORMAL LOW (ref 13.0–17.0)
Immature Granulocytes: 3 %
Lymphocytes Relative: 21 %
Lymphs Abs: 1 10*3/uL (ref 0.7–4.0)
MCH: 34.5 pg — ABNORMAL HIGH (ref 26.0–34.0)
MCHC: 32.4 g/dL (ref 30.0–36.0)
MCV: 106.5 fL — ABNORMAL HIGH (ref 80.0–100.0)
Monocytes Absolute: 0.4 10*3/uL (ref 0.1–1.0)
Monocytes Relative: 9 %
Neutro Abs: 3.1 10*3/uL (ref 1.7–7.7)
Neutrophils Relative %: 64 %
Platelets: 467 10*3/uL — ABNORMAL HIGH (ref 150–400)
RBC: 3.71 MIL/uL — ABNORMAL LOW (ref 4.22–5.81)
RDW: 16.5 % — ABNORMAL HIGH (ref 11.5–15.5)
WBC: 4.7 10*3/uL (ref 4.0–10.5)
nRBC: 0 % (ref 0.0–0.2)

## 2019-12-21 NOTE — Assessment & Plan Note (Signed)
1.  Essential thrombocytosis, CALR positive: - High risk disease with history of thrombosis (left leg DVT) and age more than 90. - No vasomotor symptoms.  No aquagenic pruritus.  Hydroxyurea started in December 2018.  He is on aspirin 81 mg daily. - On 06/17/2019, hydroxyurea increased to 3 tablets on Mondays, Wednesdays and Fridays and 2 tablets rest of the week. - He is tolerating it very well.  Platelet count improved to 467.  Hemoglobin is 12.8. -Recommend continue current regimen of Hydrea.. -We will reevaluate him in 3 months.  2.  Balance problems: - He complains of balance problems when he stands up and about to walk.  Blood pressure is normal.  He was evaluated by Dr. 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  Vestibular neurodiagnostic testing was recommended for possible benefit from rehab. -I have asked the patient to reach out to Dr. Benjamine Mola for further work-up.   3.  Right oral mucosal lesion: -He follows up with Dr. Benjamine Mola.  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

## 2019-12-21 NOTE — Progress Notes (Signed)
[place], [place] [address]   CLINIC:  Medical Oncology/Hematology  PCP:  Caryl Bis, MD [place] [address] [phone]   REASON FOR VISIT:  Follow-up for essential thrombocytosis  CURRENT THERAPY: Hydrea  BRIEF ONCOLOGIC HISTORY:  Oncology History  Essential thrombocytosis ([place])  08/31/2011 Cancer Diagnosis   History of colon cancer, status post resection in 2012, status post adjuvant chemotherapy with 6 months of 5-FU and leucovorin under the direction of Dr.Karb at [facility]   09/29/2017 Imaging   CT scan of the abdomen done for nonspecific abdominal pain shows incidental splenomegaly  Patient referred to [place] for further workup   10/30/2017 Genetic Testing   CALR mutation positive Jak 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 male presents today for follow-up.  He reports overall doing well.  He continues with reports of mild to moderate fatigue.  He is currently on Hydrea, tolerating well.  He denies any fevers, chills, night sweats.  Denies any recent history of thrombus.  He is here for repeat labs and office visit.   REVIEW OF SYSTEMS:  Review of Systems  Constitutional: Positive for fatigue.  HENT:  Negative.   Eyes: Negative.   Respiratory: Negative.   Cardiovascular: Negative.   Gastrointestinal: Negative.   Endocrine: Negative.   Genitourinary: Negative.    Musculoskeletal: Positive for arthralgias.  Skin: Negative.   Neurological: Negative.   Hematological:  Negative.   Psychiatric/Behavioral: Negative.      PAST MEDICAL/SURGICAL HISTORY:  Past Medical History:  Diagnosis Date  . Diabetes ([place])   . Hypertension    No past surgical history on file.   SOCIAL HISTORY:  Social History   Socioeconomic History  . Marital status: Married    Spouse name: Not on file  . Number of children: Not on file  . Years of education: Not on file  . Highest education level: Not on file  Occupational History  . Not on file  Tobacco Use  . Smoking status: Former Smoker    Packs/day: 0.50    Years: 3.00    Pack years: 1.50    Types: Cigarettes    Quit date: 04/08/1978    Years since quitting: 41.7  . Smokeless tobacco: Never Used  Substance and Sexual Activity  . Alcohol use: Never  . Drug use: Never  . Sexual activity: Not on file  Other Topics Concern  . Not on file  Social History Narrative  . Not on file   Social Determinants of Health   Financial Resource Strain:   . Difficulty of Paying Living Expenses: Not on file  Food Insecurity:   . Worried About Charity fundraiser in the Last Year: Not on file  . Ran Out of Food in the Last Year: Not on file  Transportation Needs:   . Lack of Transportation (Medical): Not on file  . Lack of Transportation (Non-Medical): Not on file  Physical Activity:   . Days of Exercise per Week: Not on file  . Minutes of Exercise per Session:  Not on file  Stress:   . Feeling of Stress : Not on file  Social Connections:   . Frequency of Communication with Friends and Family: Not on file  . Frequency of Social Gatherings with Friends and Family: Not on file  . Attends Religious Services: Not on file  . Active Member of Clubs or Organizations: Not on file  . Attends Archivist Meetings: Not on file  . Marital Status: Not on file  Intimate Partner Violence:   . Fear of Current or Ex-Partner: Not on file  . Emotionally Abused: Not on file  . Physically Abused: Not on file  . Sexually  Abused: Not on file    FAMILY HISTORY:  Family History  Problem Relation Age of Onset  . Dementia Mother   . Cancer Paternal Uncle        lung    CURRENT MEDICATIONS:  Outpatient Encounter Medications as of 12/21/2019  Medication Sig  . ACCU-CHEK AVIVA PLUS test strip TEST BLOOD SUGAR D  . atorvastatin (LIPITOR) 10 MG tablet Take 10 mg by mouth once a week.  . hydroxyurea (HYDREA) 500 MG capsule Take 1 capsule (500 mg total) by mouth daily. Take 3 tablets Monday,Wednesday and Friday. Take 2 tablets Tuesday,Thursday,Saturday, and Sunday  . metFORMIN (GLUCOPHAGE-XR) 500 MG 24 hr tablet Take 500 mg by mouth daily.   . metoprolol succinate (TOPROL-XL) 50 MG 24 hr tablet Take 50 mg by mouth daily.   . cyclobenzaprine (FLEXERIL) 10 MG tablet Take 10 mg by mouth as needed.   . promethazine (PHENERGAN) 12.5 MG tablet Take 12.5 mg by mouth as needed.    No facility-administered encounter medications on file as of 12/21/2019.    ALLERGIES:  Allergies  Allergen Reactions  . Ivp Dye  [Iodinated Diagnostic Agents]      PHYSICAL EXAM:  ECOG Performance status: 1  Vitals:   12/21/19 1153  BP: (!) 141/87  Pulse: 62  Resp: 16  Temp: 97.7 F (36.5 C)  SpO2: 97%   Filed Weights   12/21/19 1153  Weight: 252 lb 1.6 oz (114.4 kg)    Physical Exam Constitutional:      Appearance: Normal appearance.  HENT:     Head: Normocephalic.     Right Ear: External ear normal.     Left Ear: External ear normal.     Nose: Nose normal.  Eyes:     Conjunctiva/sclera: Conjunctivae normal.  Cardiovascular:     Rate and Rhythm: Normal rate and regular rhythm.     Pulses: Normal pulses.     Heart sounds: Normal heart sounds.  Pulmonary:     Effort: Pulmonary effort is normal.     Breath sounds: Normal breath sounds.  Abdominal:     General: Bowel sounds are normal.  Musculoskeletal:     Cervical back: Normal range of motion.     Comments: Decreased range of motion  Skin:    General:  Skin is warm.  Neurological:     General: No focal deficit present.     Mental Status: He is alert and oriented to person, place, and time.  Psychiatric:        Mood and Affect: Mood normal.        Behavior: Behavior normal.      LABORATORY DATA:  I have reviewed the labs as listed.  CBC    Component Value Date/Time   WBC 4.7 12/21/2019 1059   RBC 3.71 (L) 12/21/2019 1059   HGB  12.8 (L) 12/21/2019 1059   HCT 39.5 12/21/2019 1059   PLT 467 (H) 12/21/2019 1059   MCV 106.5 (H) 12/21/2019 1059   MCH 34.5 (H) 12/21/2019 1059   MCHC 32.4 12/21/2019 1059   RDW 16.5 (H) 12/21/2019 1059   LYMPHSABS 1.0 12/21/2019 1059   MONOABS 0.4 12/21/2019 1059   EOSABS 0.1 12/21/2019 1059   BASOSABS 0.1 12/21/2019 1059   CMP Latest Ref Rng & Units 12/21/2019 09/20/2019 06/17/2019  Glucose 70 - 99 mg/dL 182(H) 149(H) 142(H)  BUN 8 - 23 mg/dL 16 11 17   Creatinine 0.61 - 1.24 mg/dL 1.18 1.07 1.22  Sodium 135 - 145 mmol/L 136 138 138  Potassium 3.5 - 5.1 mmol/L 4.1 4.2 4.0  Chloride 98 - 111 mmol/L 102 108 106  CO2 22 - 32 mmol/L 23 23 23   Calcium 8.9 - 10.3 mg/dL 9.4 9.1 9.4  Total Protein 6.5 - 8.1 g/dL 7.0 6.7 6.9  Total Bilirubin 0.3 - 1.2 mg/dL 0.9 0.8 0.9  Alkaline Phos 38 - 126 U/L 32(L) 34(L) 37(L)  AST 15 - 41 U/L 21 20 21   ALT 0 - 44 U/L 30 30 25            ASSESSMENT & PLAN:   Essential thrombocytosis ([facility]) 1.  Essential thrombocytosis, CALR positive: - High risk disease with history of thrombosis (left leg DVT) and age more than 67. - No vasomotor symptoms.  No aquagenic pruritus.  Hydroxyurea started in December 2018.  He is on aspirin 81 mg daily. - On 06/17/2019, hydroxyurea increased to 3 tablets on Mondays, Wednesdays and Fridays and 2 tablets rest of the week. - He is tolerating it very well.  Platelet count improved to 467.  Hemoglobin is 12.8. -Recommend continue current regimen of Hydrea.. -We will reevaluate him in 3 months.  2.  Balance problems: - He  complains of balance problems when he stands up and about to walk.  Blood pressure is normal.  He was evaluated by Dr. 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  Vestibular neurodiagnostic testing was recommended for possible benefit from rehab. -I have asked the patient to reach out to Dr. Benjamine Mola for further work-up.   3.  Right oral mucosal lesion: -He follows up with Dr. Benjamine Mola.  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      [place] [phone]

## 2020-02-07 DIAGNOSIS — H34832 Tributary (branch) retinal vein occlusion, left eye, with macular edema: Secondary | ICD-10-CM | POA: Diagnosis not present

## 2020-02-09 DIAGNOSIS — H25011 Cortical age-related cataract, right eye: Secondary | ICD-10-CM | POA: Diagnosis not present

## 2020-02-09 DIAGNOSIS — H25811 Combined forms of age-related cataract, right eye: Secondary | ICD-10-CM | POA: Diagnosis not present

## 2020-03-06 DIAGNOSIS — Z961 Presence of intraocular lens: Secondary | ICD-10-CM | POA: Diagnosis not present

## 2020-03-07 DIAGNOSIS — H34832 Tributary (branch) retinal vein occlusion, left eye, with macular edema: Secondary | ICD-10-CM | POA: Diagnosis not present

## 2020-03-14 DIAGNOSIS — Z23 Encounter for immunization: Secondary | ICD-10-CM | POA: Diagnosis not present

## 2020-03-20 ENCOUNTER — Other Ambulatory Visit (HOSPITAL_COMMUNITY): Payer: Self-pay | Admitting: *Deleted

## 2020-03-20 DIAGNOSIS — D473 Essential (hemorrhagic) thrombocythemia: Secondary | ICD-10-CM

## 2020-03-21 ENCOUNTER — Inpatient Hospital Stay (HOSPITAL_COMMUNITY): Payer: Medicare Other | Attending: Hematology

## 2020-03-21 ENCOUNTER — Other Ambulatory Visit: Payer: Self-pay

## 2020-03-21 DIAGNOSIS — Z7984 Long term (current) use of oral hypoglycemic drugs: Secondary | ICD-10-CM | POA: Insufficient documentation

## 2020-03-21 DIAGNOSIS — N529 Male erectile dysfunction, unspecified: Secondary | ICD-10-CM | POA: Diagnosis not present

## 2020-03-21 DIAGNOSIS — Z86718 Personal history of other venous thrombosis and embolism: Secondary | ICD-10-CM | POA: Insufficient documentation

## 2020-03-21 DIAGNOSIS — Z87891 Personal history of nicotine dependence: Secondary | ICD-10-CM | POA: Diagnosis not present

## 2020-03-21 DIAGNOSIS — Z7982 Long term (current) use of aspirin: Secondary | ICD-10-CM | POA: Insufficient documentation

## 2020-03-21 DIAGNOSIS — D473 Essential (hemorrhagic) thrombocythemia: Secondary | ICD-10-CM | POA: Diagnosis not present

## 2020-03-21 DIAGNOSIS — R197 Diarrhea, unspecified: Secondary | ICD-10-CM | POA: Diagnosis not present

## 2020-03-21 LAB — COMPREHENSIVE METABOLIC PANEL
ALT: 30 U/L (ref 0–44)
AST: 17 U/L (ref 15–41)
Albumin: 4.4 g/dL (ref 3.5–5.0)
Alkaline Phosphatase: 35 U/L — ABNORMAL LOW (ref 38–126)
Anion gap: 10 (ref 5–15)
BUN: 16 mg/dL (ref 8–23)
CO2: 23 mmol/L (ref 22–32)
Calcium: 9.3 mg/dL (ref 8.9–10.3)
Chloride: 105 mmol/L (ref 98–111)
Creatinine, Ser: 1.19 mg/dL (ref 0.61–1.24)
GFR calc Af Amer: >60 mL/min (ref >60)
GFR calc non Af Amer: >60 mL/min (ref >60)
Glucose, Bld: 157 mg/dL — ABNORMAL HIGH (ref 70–99)
Potassium: 4.1 mmol/L (ref 3.5–5.1)
Sodium: 138 mmol/L (ref 135–145)
Total Bilirubin: 0.8 mg/dL (ref 0.3–1.2)
Total Protein: 6.8 g/dL (ref 6.5–8.1)

## 2020-03-21 LAB — CBC WITH DIFFERENTIAL/PLATELET
Abs Immature Granulocytes: 0.21 10*3/uL — ABNORMAL HIGH (ref 0.00–0.07)
Basophils Absolute: 0.1 10*3/uL (ref 0.0–0.1)
Basophils Relative: 2 %
Eosinophils Absolute: 0 10*3/uL (ref 0.0–0.5)
Eosinophils Relative: 1 %
HCT: 37.1 % — ABNORMAL LOW (ref 39.0–52.0)
Hemoglobin: 11.9 g/dL — ABNORMAL LOW (ref 13.0–17.0)
Immature Granulocytes: 5 %
Lymphocytes Relative: 26 %
Lymphs Abs: 1.2 10*3/uL (ref 0.7–4.0)
MCH: 34 pg (ref 26.0–34.0)
MCHC: 32.1 g/dL (ref 30.0–36.0)
MCV: 106 fL — ABNORMAL HIGH (ref 80.0–100.0)
Monocytes Absolute: 0.5 10*3/uL (ref 0.1–1.0)
Monocytes Relative: 10 %
Neutro Abs: 2.7 10*3/uL (ref 1.7–7.7)
Neutrophils Relative %: 56 %
Platelets: 436 10*3/uL — ABNORMAL HIGH (ref 150–400)
RBC: 3.5 MIL/uL — ABNORMAL LOW (ref 4.22–5.81)
RDW: 17 % — ABNORMAL HIGH (ref 11.5–15.5)
WBC: 4.6 10*3/uL (ref 4.0–10.5)
nRBC: 0.4 % — ABNORMAL HIGH (ref 0.0–0.2)

## 2020-03-21 LAB — LACTATE DEHYDROGENASE: LDH: 195 U/L — ABNORMAL HIGH (ref 98–192)

## 2020-03-28 ENCOUNTER — Encounter (HOSPITAL_COMMUNITY): Payer: Self-pay | Admitting: Hematology

## 2020-03-28 ENCOUNTER — Other Ambulatory Visit: Payer: Self-pay

## 2020-03-28 ENCOUNTER — Inpatient Hospital Stay (HOSPITAL_BASED_OUTPATIENT_CLINIC_OR_DEPARTMENT_OTHER): Payer: Medicare Other | Admitting: Hematology

## 2020-03-28 ENCOUNTER — Ambulatory Visit (HOSPITAL_COMMUNITY): Payer: Medicare Other | Admitting: Hematology

## 2020-03-28 VITALS — BP 132/75 | HR 97 | Temp 97.3°F | Resp 18 | Wt 253.3 lb

## 2020-03-28 DIAGNOSIS — D473 Essential (hemorrhagic) thrombocythemia: Secondary | ICD-10-CM

## 2020-03-28 DIAGNOSIS — Z7982 Long term (current) use of aspirin: Secondary | ICD-10-CM | POA: Diagnosis not present

## 2020-03-28 DIAGNOSIS — Z7984 Long term (current) use of oral hypoglycemic drugs: Secondary | ICD-10-CM | POA: Diagnosis not present

## 2020-03-28 DIAGNOSIS — N529 Male erectile dysfunction, unspecified: Secondary | ICD-10-CM | POA: Diagnosis not present

## 2020-03-28 DIAGNOSIS — Z86718 Personal history of other venous thrombosis and embolism: Secondary | ICD-10-CM | POA: Diagnosis not present

## 2020-03-28 DIAGNOSIS — R197 Diarrhea, unspecified: Secondary | ICD-10-CM | POA: Diagnosis not present

## 2020-03-28 MED ORDER — SILDENAFIL CITRATE 50 MG PO TABS: 50.0000 mg | ORAL_TABLET | Freq: Every day | ORAL | 0 refills | Status: DC | PRN

## 2020-03-28 NOTE — Assessment & Plan Note (Addendum)
1.  Essential thrombocytosis, CALR+: -High risk disease with history of thrombosis (left leg DVT) at age more than 36. -No vasomotor symptoms.  No echogenic pruritus. -Hydroxyurea started in December 2018.  He is on aspirin 81 mg daily. -He is currently taking hydroxyurea 3 tablets on Mondays, Wednesday and Friday in the mornings and 2 tablets rest of the week. -He reports having diarrhea about 3-4 times every morning.  He wonders if it is being caused by Hydrea.  He also takes Metformin in the evenings. -I have told him to switch Hydrea to evening.  We reviewed his CBC.  Platelet count improved to 436.  Hematocrit is 37.1.  White count is normal at 4.6. -We will continue the same dose of Hydrea.  We will reevaluate him in 3 months.  2.  Right oral mucosal lesion: -He follows up with Dr. Benjamine Mola.  Lesion was felt to be hemangioma-like 1 cm stable lesion with no surface ulceration.  3.  Erectile dysfunction: -He reports decreased erections.  I talked to him about starting him on Viagra 50 mg as needed.  I asked him to not to take it together with his blood pressure medication.

## 2020-03-28 NOTE — Patient Instructions (Signed)
[place] at [facility] Discharge Instructions  You were seen today by Dr. Delton Coombes. He went over your recent lab results. Continue taking your Hydroxyurea as directed. Try taking the Hydrea at night before bedtime to see if that helps with the diarrhea in the mornings. He will see you back in 3 months for labs and follow up.   Thank you for choosing [place] at [facility] to provide your oncology and hematology care.  To afford each patient quality time with our provider, please arrive at least 15 minutes before your scheduled appointment time.   If you have a lab appointment with the [facility] please come in thru the  Main Entrance and check in at the main information desk  You need to re-schedule your appointment should you arrive 10 or more minutes late.  We strive to give you quality time with our providers, and arriving late affects you and other patients whose appointments are after yours.  Also, if you no show three or more times for appointments you may be dismissed from the clinic at the providers discretion.     Again, thank you for choosing [facility].  Our hope is that these requests will decrease the amount of time that you wait before being seen by our physicians.       _____________________________________________________________  Should you have questions after your visit to [facility], please contact our office at [phone] between the hours of 8:00 a.m. and 4:30 p.m.  Voicemails left after 4:00 p.m. will not be returned until the following business day.  For prescription refill requests, have your pharmacy contact our office and allow 72 hours.    Cancer Center Support Programs:   > Cancer Support Group  2nd Tuesday of the month 1pm-2pm, Journey Room

## 2020-03-28 NOTE — Progress Notes (Signed)
 [facility] [address] [place], [place] [address]   CLINIC:  Medical Oncology/Hematology  PCP:  Daniel, Terry G, MD [address] [place] [address] [phone]   REASON FOR VISIT:  Follow-up for Essential thrombocytosis, CALR positive  CURRENT THERAPY: Hydroxyurea  BRIEF ONCOLOGIC HISTORY:  Oncology History  Essential thrombocytosis (HCC)  08/31/2011 Cancer Diagnosis   History of colon cancer, status post resection in 2012, status post adjuvant chemotherapy with 6 months of 5-FU and leucovorin under the direction of Dr.Karb at [facility]   09/29/2017 Imaging   CT scan of the abdomen done for nonspecific abdominal pain shows incidental splenomegaly  Patient referred to us for further workup   10/30/2017 Genetic Testing   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   INTERVAL HISTORY:  Justin Berger 69 y.o. male seen for follow-up of essential thrombocytosis.  Denies any vasomotor symptoms or erythromelalgia.  Denies any recent DVT or strokes.  Taking aspirin 81 mg daily.  Reports diarrhea about 4 times every day.  He is apparently taking hydroxyurea in the mornings.  He takes Metformin in the evenings.  Denies any fevers, night sweats or weight loss.  Reports decreased erections.  Occasional dizziness has been stable.    REVIEW OF SYSTEMS:  Review of Systems  Gastrointestinal: Positive for diarrhea.  Neurological: Positive for dizziness.  All  other systems reviewed and are negative.    PAST MEDICAL/SURGICAL HISTORY:  Past Medical History:  Diagnosis Date  . Diabetes (HCC)   . Hypertension    History reviewed. No pertinent surgical history.   SOCIAL HISTORY:  Social History   Socioeconomic History  . Marital status: Married    Spouse name: Not on file  . Number of children: Not on file  . Years of education: Not on file  . Highest education level: Not on file  Occupational History  . Not on file  Tobacco Use  . Smoking status: Former Smoker    Packs/day: 0.50    Years: 3.00    Pack years: 1.50    Types: Cigarettes    Quit date: 04/08/1978    Years since quitting: 42.0  . Smokeless tobacco: Never Used  Substance and Sexual Activity  . Alcohol use: Never  . Drug use: Never  . Sexual activity: Not on file  Other Topics Concern  . Not on file  Social History Narrative  . Not on file   Social Determinants of Health   Financial Resource Strain:   . Difficulty of Paying Living Expenses:   Food Insecurity:   . Worried About Running Out of Food in the Last Year:   . Ran Out of Food in the Last Year:   Transportation Needs:   . Lack of Transportation (Medical):   . Lack of Transportation (Non-Medical):   Physical Activity:   . Days of Exercise per Week:   . Minutes of Exercise per Session:   Stress:   . Feeling of Stress :   Social Connections:   . Frequency of   Communication with Friends and Family:   . Frequency of Social Gatherings with Friends and Family:   . Attends Religious Services:   . Active Member of Clubs or Organizations:   . Attends Archivist Meetings:   [name] Marital Status:   Intimate Partner Violence:   . Fear of Current or Ex-Partner:   . Emotionally Abused:   [name] Physically Abused:   . Sexually Abused:     FAMILY HISTORY:  Family History  Problem Relation Age of Onset  . Dementia Mother   . Cancer Paternal Uncle        lung    CURRENT MEDICATIONS:  Outpatient  Encounter Medications as of 03/28/2020  Medication Sig  . ACCU-CHEK AVIVA PLUS test strip TEST BLOOD SUGAR D  . atorvastatin (LIPITOR) 10 MG tablet Take 10 mg by mouth once a week.  [name] glipiZIDE (GLUCOTROL XL) 10 MG 24 hr tablet Take 10 mg by mouth daily.  . hydroxyurea (HYDREA) 500 MG capsule Take 1 capsule (500 mg total) by mouth daily. Take 3 tablets Monday,Wednesday and Friday. Take 2 tablets Tuesday,Thursday,Saturday, and Sunday  . metFORMIN (GLUCOPHAGE-XR) 500 MG 24 hr tablet Take 500 mg by mouth daily.   . metoprolol succinate (TOPROL-XL) 50 MG 24 hr tablet Take 50 mg by mouth daily.   . clonazePAM (KLONOPIN) 0.5 MG tablet Take 0.5 mg by mouth 2 (two) times daily as needed.  . cyclobenzaprine (FLEXERIL) 10 MG tablet Take 10 mg by mouth as needed.   . promethazine (PHENERGAN) 12.5 MG tablet Take 12.5 mg by mouth as needed.   . sildenafil (VIAGRA) 50 MG tablet Take 1 tablet (50 mg total) by mouth daily as needed for erectile dysfunction.  . [DISCONTINUED] predniSONE (DELTASONE) 50 MG tablet Take 50 mg by mouth daily.   No facility-administered encounter medications on file as of 03/28/2020.    ALLERGIES:  Allergies  Allergen Reactions  . Ivp Dye  [Iodinated Diagnostic Agents]      PHYSICAL EXAM:  ECOG Performance status: 1  Vitals:   03/28/20 1440  BP: 132/75  Pulse: 97  Resp: 18  Temp: (!) 97.3 F (36.3 C)  SpO2: 97%   Filed Weights   03/28/20 1440  Weight: 253 lb 4.8 oz (114.9 kg)    Physical Exam Constitutional:      Appearance: Normal appearance.  Cardiovascular:     Rate and Rhythm: Normal rate and regular rhythm.  Pulmonary:     Effort: Pulmonary effort is normal.     Breath sounds: Normal breath sounds.  Abdominal:     General: Bowel sounds are normal. There is no distension.     Palpations: Abdomen is soft. There is no mass.  Musculoskeletal:        General: No swelling.  Lymphadenopathy:     Cervical: No cervical adenopathy.  Skin:    General: Skin  is warm.  Neurological:     General: No focal deficit present.     Mental Status: He is alert and oriented to person, place, and time.  Psychiatric:        Mood and Affect: Mood normal.        Behavior: Behavior normal.      LABORATORY DATA:  I have reviewed the labs as listed.  CBC    Component Value Date/Time   WBC 4.6 03/21/2020 1119   RBC 3.50 (L) 03/21/2020 1119   HGB 11.9 (L) 03/21/2020 1119   HCT 37.1 (L) 03/21/2020 1119   PLT 436 (  H) 03/21/2020 1119   MCV 106.0 (H) 03/21/2020 1119   MCH 34.0 03/21/2020 1119   MCHC 32.1 03/21/2020 1119   RDW 17.0 (H) 03/21/2020 1119   LYMPHSABS 1.2 03/21/2020 1119   MONOABS 0.5 03/21/2020 1119   EOSABS 0.0 03/21/2020 1119   BASOSABS 0.1 03/21/2020 1119   CMP Latest Ref Rng & Units 03/21/2020 12/21/2019 09/20/2019  Glucose 70 - 99 mg/dL 157(H) 182(H) 149(H)  BUN 8 - 23 mg/dL 16 16 11  Creatinine 0.61 - 1.24 mg/dL 1.19 1.18 1.07  Sodium 135 - 145 mmol/L 138 136 138  Potassium 3.5 - 5.1 mmol/L 4.1 4.1 4.2  Chloride 98 - 111 mmol/L 105 102 108  CO2 22 - 32 mmol/L 23 23 23  Calcium 8.9 - 10.3 mg/dL 9.3 9.4 9.1  Total Protein 6.5 - 8.1 g/dL 6.8 7.0 6.7  Total Bilirubin 0.3 - 1.2 mg/dL 0.8 0.9 0.8  Alkaline Phos 38 - 126 U/L 35(L) 32(L) 34(L)  AST 15 - 41 U/L 17 21 20  ALT 0 - 44 U/L 30 30 30       DIAGNOSTIC IMAGING:  I have reviewed the scans.   I have reviewed Ashley Travis LPN's note and agree with the documentation.  I personally performed a face-to-face visit, made revisions and my assessment and plan is as follows.    ASSESSMENT & PLAN:   Essential thrombocytosis (HCC) 1.  Essential thrombocytosis, CALR+: -High risk disease with history of thrombosis (left leg DVT) at age more than 59. -No vasomotor symptoms.  No echogenic pruritus. -Hydroxyurea started in December 2018.  He is on aspirin 81 mg daily. -He is currently taking hydroxyurea 3 tablets on Mondays, Wednesday and Friday in the mornings and 2 tablets rest  of the week. -He reports having diarrhea about 3-4 times every morning.  He wonders if it is being caused by Hydrea.  He also takes Metformin in the evenings. -I have told him to switch Hydrea to evening.  We reviewed his CBC.  Platelet count improved to 436.  Hematocrit is 37.1.  White count is normal at 4.6. -We will continue the same dose of Hydrea.  We will reevaluate him in 3 months.  2.  Right oral mucosal lesion: -He follows up with Dr. Teoh.  Lesion was felt to be hemangioma-like 1 cm stable lesion with no surface ulceration.  3.  Erectile dysfunction: -He reports decreased erections.  I talked to him about starting him on Viagra 50 mg as needed.  I asked him to not to take it together with his blood pressure medication.   Orders placed this encounter:  Orders Placed This Encounter  Procedures  . CBC with Differential/Platelet  . Comprehensive metabolic panel  . Lactate dehydrogenase      Sreedhar Katragadda, MD [facility] [phone]   

## 2020-04-05 DIAGNOSIS — H40013 Open angle with borderline findings, low risk, bilateral: Secondary | ICD-10-CM | POA: Diagnosis not present

## 2020-04-05 DIAGNOSIS — Z961 Presence of intraocular lens: Secondary | ICD-10-CM | POA: Diagnosis not present

## 2020-04-05 DIAGNOSIS — H34832 Tributary (branch) retinal vein occlusion, left eye, with macular edema: Secondary | ICD-10-CM | POA: Diagnosis not present

## 2020-04-05 DIAGNOSIS — H2512 Age-related nuclear cataract, left eye: Secondary | ICD-10-CM | POA: Diagnosis not present

## 2020-04-07 DIAGNOSIS — Z23 Encounter for immunization: Secondary | ICD-10-CM | POA: Diagnosis not present

## 2020-05-02 DIAGNOSIS — H25812 Combined forms of age-related cataract, left eye: Secondary | ICD-10-CM | POA: Diagnosis not present

## 2020-05-02 DIAGNOSIS — Z961 Presence of intraocular lens: Secondary | ICD-10-CM | POA: Diagnosis not present

## 2020-05-03 DIAGNOSIS — Z6836 Body mass index (BMI) 36.0-36.9, adult: Secondary | ICD-10-CM | POA: Diagnosis not present

## 2020-05-03 DIAGNOSIS — H2512 Age-related nuclear cataract, left eye: Secondary | ICD-10-CM | POA: Diagnosis not present

## 2020-05-03 DIAGNOSIS — H34832 Tributary (branch) retinal vein occlusion, left eye, with macular edema: Secondary | ICD-10-CM | POA: Diagnosis not present

## 2020-05-03 DIAGNOSIS — Z961 Presence of intraocular lens: Secondary | ICD-10-CM | POA: Diagnosis not present

## 2020-05-03 DIAGNOSIS — M25561 Pain in right knee: Secondary | ICD-10-CM | POA: Diagnosis not present

## 2020-05-04 DIAGNOSIS — H25012 Cortical age-related cataract, left eye: Secondary | ICD-10-CM | POA: Diagnosis not present

## 2020-05-04 DIAGNOSIS — H25812 Combined forms of age-related cataract, left eye: Secondary | ICD-10-CM | POA: Diagnosis not present

## 2020-05-31 DIAGNOSIS — H40013 Open angle with borderline findings, low risk, bilateral: Secondary | ICD-10-CM | POA: Diagnosis not present

## 2020-05-31 DIAGNOSIS — H34832 Tributary (branch) retinal vein occlusion, left eye, with macular edema: Secondary | ICD-10-CM | POA: Diagnosis not present

## 2020-05-31 DIAGNOSIS — Z961 Presence of intraocular lens: Secondary | ICD-10-CM | POA: Diagnosis not present

## 2020-06-21 ENCOUNTER — Inpatient Hospital Stay (HOSPITAL_COMMUNITY): Payer: Medicare Other | Attending: Hematology

## 2020-06-21 ENCOUNTER — Other Ambulatory Visit: Payer: Self-pay

## 2020-06-21 DIAGNOSIS — D473 Essential (hemorrhagic) thrombocythemia: Secondary | ICD-10-CM | POA: Diagnosis present

## 2020-06-21 DIAGNOSIS — Z87891 Personal history of nicotine dependence: Secondary | ICD-10-CM | POA: Insufficient documentation

## 2020-06-21 DIAGNOSIS — Z818 Family history of other mental and behavioral disorders: Secondary | ICD-10-CM | POA: Insufficient documentation

## 2020-06-21 DIAGNOSIS — Z9221 Personal history of antineoplastic chemotherapy: Secondary | ICD-10-CM | POA: Diagnosis not present

## 2020-06-21 DIAGNOSIS — Z85038 Personal history of other malignant neoplasm of large intestine: Secondary | ICD-10-CM | POA: Insufficient documentation

## 2020-06-21 DIAGNOSIS — Z86718 Personal history of other venous thrombosis and embolism: Secondary | ICD-10-CM | POA: Insufficient documentation

## 2020-06-21 DIAGNOSIS — Z801 Family history of malignant neoplasm of trachea, bronchus and lung: Secondary | ICD-10-CM | POA: Insufficient documentation

## 2020-06-21 DIAGNOSIS — R197 Diarrhea, unspecified: Secondary | ICD-10-CM | POA: Diagnosis not present

## 2020-06-21 DIAGNOSIS — Z7982 Long term (current) use of aspirin: Secondary | ICD-10-CM | POA: Diagnosis not present

## 2020-06-21 DIAGNOSIS — R5383 Other fatigue: Secondary | ICD-10-CM | POA: Insufficient documentation

## 2020-06-21 DIAGNOSIS — Z79899 Other long term (current) drug therapy: Secondary | ICD-10-CM | POA: Diagnosis not present

## 2020-06-21 LAB — CBC WITH DIFFERENTIAL/PLATELET
Abs Immature Granulocytes: 0.28 10*3/uL — ABNORMAL HIGH (ref 0.00–0.07)
Basophils Absolute: 0.1 10*3/uL (ref 0.0–0.1)
Basophils Relative: 2 %
Eosinophils Absolute: 0.1 10*3/uL (ref 0.0–0.5)
Eosinophils Relative: 1 %
HCT: 37.6 % — ABNORMAL LOW (ref 39.0–52.0)
Hemoglobin: 11.8 g/dL — ABNORMAL LOW (ref 13.0–17.0)
Immature Granulocytes: 5 %
Lymphocytes Relative: 24 %
Lymphs Abs: 1.2 10*3/uL (ref 0.7–4.0)
MCH: 33.1 pg (ref 26.0–34.0)
MCHC: 31.4 g/dL (ref 30.0–36.0)
MCV: 105.3 fL — ABNORMAL HIGH (ref 80.0–100.0)
Monocytes Absolute: 0.5 10*3/uL (ref 0.1–1.0)
Monocytes Relative: 10 %
Neutro Abs: 3.1 10*3/uL (ref 1.7–7.7)
Neutrophils Relative %: 58 %
Platelets: 548 10*3/uL — ABNORMAL HIGH (ref 150–400)
RBC: 3.57 MIL/uL — ABNORMAL LOW (ref 4.22–5.81)
RDW: 16.2 % — ABNORMAL HIGH (ref 11.5–15.5)
WBC: 5.3 10*3/uL (ref 4.0–10.5)
nRBC: 0.6 % — ABNORMAL HIGH (ref 0.0–0.2)

## 2020-06-21 LAB — COMPREHENSIVE METABOLIC PANEL
ALT: 31 U/L (ref 0–44)
AST: 18 U/L (ref 15–41)
Albumin: 4.5 g/dL (ref 3.5–5.0)
Alkaline Phosphatase: 39 U/L (ref 38–126)
Anion gap: 10 (ref 5–15)
BUN: 15 mg/dL (ref 8–23)
CO2: 21 mmol/L — ABNORMAL LOW (ref 22–32)
Calcium: 9.6 mg/dL (ref 8.9–10.3)
Chloride: 106 mmol/L (ref 98–111)
Creatinine, Ser: 1.13 mg/dL (ref 0.61–1.24)
GFR calc Af Amer: >60 mL/min (ref >60)
GFR calc non Af Amer: >60 mL/min (ref >60)
Glucose, Bld: 127 mg/dL — ABNORMAL HIGH (ref 70–99)
Potassium: 4 mmol/L (ref 3.5–5.1)
Sodium: 137 mmol/L (ref 135–145)
Total Bilirubin: 0.7 mg/dL (ref 0.3–1.2)
Total Protein: 7 g/dL (ref 6.5–8.1)

## 2020-06-21 LAB — LACTATE DEHYDROGENASE: LDH: 220 U/L — ABNORMAL HIGH (ref 98–192)

## 2020-06-28 ENCOUNTER — Inpatient Hospital Stay (HOSPITAL_BASED_OUTPATIENT_CLINIC_OR_DEPARTMENT_OTHER): Payer: Medicare Other | Admitting: Nurse Practitioner

## 2020-06-28 DIAGNOSIS — D473 Essential (hemorrhagic) thrombocythemia: Secondary | ICD-10-CM

## 2020-06-28 MED ORDER — HYDROXYUREA 500 MG PO CAPS: 500.0000 mg | ORAL_CAPSULE | Freq: Every day | ORAL | 3 refills | Status: DC

## 2020-06-28 NOTE — Assessment & Plan Note (Addendum)
1.  Essential thrombocytosis, CALR+: -High risk disease with history of thrombosis (left leg DVT) and age more than 26. -No vasomotor symptoms.  No aquagenic pruritus. -Hydroxyurea started in December 2018.  He is on aspirin 81 mg daily. -He is currently taking hydroxyurea 3 tablets on Mondays Wednesdays and Fridays in the mornings and 2 tablets the rest of the week. -He reports having diarrhea about 3-4 times every morning.  He was questioning the hydroxyurea but also takes Metformin as well. -Labs done on 06/21/2020 showed platelet count has increased to 548.  Hemoglobin 11.8, hematocrit 37.6, MCV 105.3, and WBC 5.3 -He will start taking 3 tablets Monday through Friday and take 2 tablets on the weekends. -We will reevaluate him in 2 months with repeat labs.

## 2020-06-28 NOTE — Progress Notes (Signed)
[place], [place] [address]   CLINIC:  Medical Oncology/Hematology  PCP:  Caryl Bis, MD [place] [address] [phone]   REASON FOR VISIT: Follow-up for essential thrombocytosis   CURRENT THERAPY: Hydrea  BRIEF ONCOLOGIC HISTORY:  Oncology History  Essential thrombocytosis ([place])  08/31/2011 Cancer Diagnosis   History of colon cancer, status post resection in 2012, status post adjuvant chemotherapy with 6 months of 5-FU and leucovorin under the direction of Dr.Karb at [facility]   09/29/2017 Imaging   CT scan of the abdomen done for nonspecific abdominal pain shows incidental splenomegaly  Patient referred to [place] for further workup   10/30/2017 Genetic Testing   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 male returns for routine follow-up for essential thrombocytosis.  Patient reports he is taking his medication as prescribed.  He reports he does feel a little bit more fatigued than normal.  He denies any aqua genic pruritus.  He denies any leg ulcers. Denies any nausea, vomiting, or diarrhea. Denies any new pains. Had not noticed any recent bleeding such as epistaxis, hematuria or hematochezia. Denies recent chest pain on exertion, shortness of breath on minimal exertion, pre-syncopal episodes, or palpitations. Denies any numbness or tingling in hands or feet. Denies any recent fevers, infections, or recent hospitalizations. Patient reports appetite at 75% and  energy level at 0%.  He is eating well maintain his weight at this time.   REVIEW OF SYSTEMS:  Review of Systems  Constitutional: Positive for fatigue.  Gastrointestinal: Positive for diarrhea.  All other systems reviewed and are negative.    PAST MEDICAL/SURGICAL HISTORY:  Past Medical History:  Diagnosis Date  . Diabetes ([place])   . Hypertension    No past surgical history on file.   SOCIAL HISTORY:  Social History   Socioeconomic History  . Marital status: Married    Spouse name: Not on file  . Number of children: Not on file  . Years of education: Not on file  . Highest education level: Not on file  Occupational History  . Not on file  Tobacco Use  . Smoking status: Former Smoker    Packs/day: 0.50    Years: 3.00    Pack years: 1.50    Types: Cigarettes    Quit date: 04/08/1978    Years since quitting: 42.2  . Smokeless tobacco: Never Used  Vaping Use  . Vaping Use: Never used  Substance and Sexual Activity  . Alcohol use: Never  . Drug use: Never  . Sexual activity: Not on file  Other Topics Concern  . Not on file  Social History Narrative  . Not on file   Social Determinants of Health   Financial Resource Strain:   . Difficulty of Paying Living Expenses:   Food Insecurity:   . Worried About Charity fundraiser in the Last Year:   . Arboriculturist in the Last Year:   Transportation Needs:   . Film/video editor (Medical):   [name] Lack of  Transportation (Non-Medical):   Physical Activity:   . Days of Exercise per Week:   . Minutes of Exercise per Session:   Stress:   . Feeling of Stress :   Social Connections:   . Frequency of Communication with Friends and Family:   . Frequency of Social Gatherings with Friends and Family:   . Attends Religious Services:   . Active Member of Clubs or Organizations:   . Attends Archivist Meetings:   [name] Marital Status:   Intimate Partner Violence:   . Fear of Current or Ex-Partner:   . Emotionally  Abused:   [name] Physically Abused:   . Sexually Abused:     FAMILY HISTORY:  Family History  Problem Relation Age of Onset  . Dementia Mother   . Cancer Paternal Uncle        lung    CURRENT MEDICATIONS:  Outpatient Encounter Medications as of 06/28/2020  Medication Sig  . ACCU-CHEK AVIVA PLUS test strip TEST BLOOD SUGAR D  . atorvastatin (LIPITOR) 10 MG tablet Take 10 mg by mouth once a week.  [name] glipiZIDE (GLUCOTROL XL) 10 MG 24 hr tablet Take 10 mg by mouth daily.  . hydroxyurea (HYDREA) 500 MG capsule Take 1 capsule (500 mg total) by mouth daily. Take 3 tablets Monday,Wednesday and Friday. Take 2 tablets Tuesday,Thursday,Saturday, and Sunday  . metFORMIN (GLUCOPHAGE-XR) 500 MG 24 hr tablet Take 500 mg by mouth daily.   . metoprolol succinate (TOPROL-XL) 50 MG 24 hr tablet Take 50 mg by mouth daily.   . clonazePAM (KLONOPIN) 0.5 MG tablet Take 0.5 mg by mouth 2 (two) times daily as needed. (Patient not taking: Reported on 06/28/2020)  . cyclobenzaprine (FLEXERIL) 10 MG tablet Take 10 mg by mouth as needed.  (Patient not taking: Reported on 06/28/2020)  . promethazine (PHENERGAN) 12.5 MG tablet Take 12.5 mg by mouth as needed.  (Patient not taking: Reported on 06/28/2020)  . sildenafil (VIAGRA) 50 MG tablet Take 1 tablet (50 mg total) by mouth daily as needed for erectile dysfunction. (Patient not taking: Reported on 06/28/2020)   No facility-administered encounter medications on file as of 06/28/2020.    ALLERGIES:  Allergies  Allergen Reactions  . Ivp Dye  [Iodinated Diagnostic Agents]      PHYSICAL EXAM:  ECOG Performance status: 1  Vitals:   06/28/20 1355  BP: 114/83  Pulse: 100  Resp: 18  Temp: 97.7 F (36.5 C)  SpO2: 97%   Filed Weights   06/28/20 1355  Weight: 251 lb 14.4 oz (114.3 kg)   Physical Exam Constitutional:      Appearance: He is obese.  Cardiovascular:     Rate and Rhythm: Normal rate and regular rhythm.     Heart sounds: Normal heart sounds.    Pulmonary:     Effort: Pulmonary effort is normal.     Breath sounds: Normal breath sounds.  Abdominal:     General: Bowel sounds are normal.     Palpations: Abdomen is soft.  Musculoskeletal:        General: Normal range of motion.  Skin:    General: Skin is warm.  Neurological:     Mental Status: He is alert and oriented to person, place, and time. Mental status is at baseline.  Psychiatric:        Mood and Affect: Mood normal.        Behavior: Behavior normal.        Thought Content: Thought content normal.  Judgment: Judgment normal.      LABORATORY DATA:  I have reviewed the labs as listed.  CBC    Component Value Date/Time   WBC 5.3 06/21/2020 1306   RBC 3.57 (L) 06/21/2020 1306   HGB 11.8 (L) 06/21/2020 1306   HCT 37.6 (L) 06/21/2020 1306   PLT 548 (H) 06/21/2020 1306   MCV 105.3 (H) 06/21/2020 1306   MCH 33.1 06/21/2020 1306   MCHC 31.4 06/21/2020 1306   RDW 16.2 (H) 06/21/2020 1306   LYMPHSABS 1.2 06/21/2020 1306   MONOABS 0.5 06/21/2020 1306   EOSABS 0.1 06/21/2020 1306   BASOSABS 0.1 06/21/2020 1306   CMP Latest Ref Rng & Units 06/21/2020 03/21/2020 12/21/2019  Glucose 70 - 99 mg/dL 127(H) 157(H) 182(H)  BUN 8 - 23 mg/dL 15 16 16   Creatinine 0.61 - 1.24 mg/dL 1.13 1.19 1.18  Sodium 135 - 145 mmol/L 137 138 136  Potassium 3.5 - 5.1 mmol/L 4.0 4.1 4.1  Chloride 98 - 111 mmol/L 106 105 102  CO2 22 - 32 mmol/L 21(L) 23 23  Calcium 8.9 - 10.3 mg/dL 9.6 9.3 9.4  Total Protein 6.5 - 8.1 g/dL 7.0 6.8 7.0  Total Bilirubin 0.3 - 1.2 mg/dL 0.7 0.8 0.9  Alkaline Phos 38 - 126 U/L 39 35(L) 32(L)  AST 15 - 41 U/L 18 17 21   ALT 0 - 44 U/L 31 30 30    All questions were answered to patient's stated satisfaction. Encouraged patient to call with any new concerns or questions before his next visit to the cancer center and we can certain see him sooner, if needed.     ASSESSMENT & PLAN:  Essential thrombocytosis ([place]) 1.  Essential thrombocytosis, CALR+: -High  risk disease with history of thrombosis (left leg DVT) and age more than 40. -No vasomotor symptoms.  No aquagenic pruritus. -Hydroxyurea started in December 2018.  He is on aspirin 81 mg daily. -He is currently taking hydroxyurea 3 tablets on Mondays Wednesdays and Fridays in the mornings and 2 tablets the rest of the week. -He reports having diarrhea about 3-4 times every morning.  He was questioning the hydroxyurea but also takes Metformin as well. -Labs done on 06/21/2020 showed platelet count has increased to 548.  Hemoglobin 11.8, hematocrit 37.6, MCV 105.3, and WBC 5.3 -He will start taking 3 tablets Monday through Friday and take 2 tablets on the weekends. -We will reevaluate him in 2 months with repeat labs.       Orders placed this encounter:  Orders Placed This Encounter  Procedures  . Lactate dehydrogenase  . CBC with Differential/Platelet  . Comprehensive metabolic panel      Francene Finders, FNP-C [place] [phone]

## 2020-06-28 NOTE — Patient Instructions (Signed)
[facility] at [facility] Discharge Instructions  Follow up in 2 months with labs    Thank you for choosing [facility] at [facility] to provide your oncology and hematology care.  To afford each patient quality time with our provider, please arrive at least 15 minutes before your scheduled appointment time.   If you have a lab appointment with the Cancer Center please come in thru the Main Entrance and check in at the main information desk.  You need to re-schedule your appointment should you arrive 10 or more minutes late.  We strive to give you quality time with our providers, and arriving late affects you and other patients whose appointments are after yours.  Also, if you no show three or more times for appointments you may be dismissed from the clinic at the providers discretion.     Again, thank you for choosing [facility].  Our hope is that these requests will decrease the amount of time that you wait before being seen by our physicians.       _____________________________________________________________  Should you have questions after your visit to [facility], please contact our office at [phone] between the hours of 8:00 a.m. and 4:30 p.m.  Voicemails left after 4:00 p.m. will not be returned until the following business day.  For prescription refill requests, have your pharmacy contact our office and allow 72 hours.    Due to Covid, you will need to wear a mask upon entering the hospital. If you do not have a mask, a mask will be given to you at the Main Entrance upon arrival. For doctor visits, patients may have 1 support person with them. For treatment visits, patients can not have anyone with them due to social distancing guidelines and our immunocompromised population.      

## 2020-06-29 DIAGNOSIS — Z961 Presence of intraocular lens: Secondary | ICD-10-CM | POA: Diagnosis not present

## 2020-06-29 DIAGNOSIS — H34832 Tributary (branch) retinal vein occlusion, left eye, with macular edema: Secondary | ICD-10-CM | POA: Diagnosis not present

## 2020-06-29 DIAGNOSIS — E119 Type 2 diabetes mellitus without complications: Secondary | ICD-10-CM | POA: Diagnosis not present

## 2020-06-29 DIAGNOSIS — H40013 Open angle with borderline findings, low risk, bilateral: Secondary | ICD-10-CM | POA: Diagnosis not present

## 2020-07-12 DIAGNOSIS — Z961 Presence of intraocular lens: Secondary | ICD-10-CM | POA: Diagnosis not present

## 2020-07-12 DIAGNOSIS — H5213 Myopia, bilateral: Secondary | ICD-10-CM | POA: Diagnosis not present

## 2020-07-12 DIAGNOSIS — H524 Presbyopia: Secondary | ICD-10-CM | POA: Diagnosis not present

## 2020-07-12 DIAGNOSIS — H52223 Regular astigmatism, bilateral: Secondary | ICD-10-CM | POA: Diagnosis not present

## 2020-07-28 DIAGNOSIS — E1142 Type 2 diabetes mellitus with diabetic polyneuropathy: Secondary | ICD-10-CM | POA: Diagnosis not present

## 2020-07-28 DIAGNOSIS — E1165 Type 2 diabetes mellitus with hyperglycemia: Secondary | ICD-10-CM | POA: Diagnosis not present

## 2020-07-28 DIAGNOSIS — I10 Essential (primary) hypertension: Secondary | ICD-10-CM | POA: Diagnosis not present

## 2020-07-28 DIAGNOSIS — Z125 Encounter for screening for malignant neoplasm of prostate: Secondary | ICD-10-CM | POA: Diagnosis not present

## 2020-07-28 DIAGNOSIS — E782 Mixed hyperlipidemia: Secondary | ICD-10-CM | POA: Diagnosis not present

## 2020-07-28 DIAGNOSIS — N189 Chronic kidney disease, unspecified: Secondary | ICD-10-CM | POA: Diagnosis not present

## 2020-07-28 DIAGNOSIS — E7801 Familial hypercholesterolemia: Secondary | ICD-10-CM | POA: Diagnosis not present

## 2020-08-01 DIAGNOSIS — M109 Gout, unspecified: Secondary | ICD-10-CM | POA: Diagnosis not present

## 2020-08-01 DIAGNOSIS — I10 Essential (primary) hypertension: Secondary | ICD-10-CM | POA: Diagnosis not present

## 2020-08-01 DIAGNOSIS — G252 Other specified forms of tremor: Secondary | ICD-10-CM | POA: Diagnosis not present

## 2020-08-01 DIAGNOSIS — Z0000 Encounter for general adult medical examination without abnormal findings: Secondary | ICD-10-CM | POA: Diagnosis not present

## 2020-08-01 DIAGNOSIS — E782 Mixed hyperlipidemia: Secondary | ICD-10-CM | POA: Diagnosis not present

## 2020-08-01 DIAGNOSIS — E1142 Type 2 diabetes mellitus with diabetic polyneuropathy: Secondary | ICD-10-CM | POA: Diagnosis not present

## 2020-08-01 DIAGNOSIS — D473 Essential (hemorrhagic) thrombocythemia: Secondary | ICD-10-CM | POA: Diagnosis not present

## 2020-08-01 DIAGNOSIS — R4582 Worries: Secondary | ICD-10-CM | POA: Diagnosis not present

## 2020-08-02 DIAGNOSIS — Z961 Presence of intraocular lens: Secondary | ICD-10-CM | POA: Diagnosis not present

## 2020-08-02 DIAGNOSIS — H40013 Open angle with borderline findings, low risk, bilateral: Secondary | ICD-10-CM | POA: Diagnosis not present

## 2020-08-02 DIAGNOSIS — H34832 Tributary (branch) retinal vein occlusion, left eye, with macular edema: Secondary | ICD-10-CM | POA: Diagnosis not present

## 2020-08-02 DIAGNOSIS — E119 Type 2 diabetes mellitus without complications: Secondary | ICD-10-CM | POA: Diagnosis not present

## 2020-08-05 DIAGNOSIS — L820 Inflamed seborrheic keratosis: Secondary | ICD-10-CM | POA: Diagnosis not present

## 2020-08-05 DIAGNOSIS — L02213 Cutaneous abscess of chest wall: Secondary | ICD-10-CM | POA: Diagnosis not present

## 2020-08-05 DIAGNOSIS — D485 Neoplasm of uncertain behavior of skin: Secondary | ICD-10-CM | POA: Diagnosis not present

## 2020-08-17 DIAGNOSIS — Z6836 Body mass index (BMI) 36.0-36.9, adult: Secondary | ICD-10-CM | POA: Diagnosis not present

## 2020-08-17 DIAGNOSIS — Z85038 Personal history of other malignant neoplasm of large intestine: Secondary | ICD-10-CM | POA: Diagnosis not present

## 2020-08-25 DIAGNOSIS — I10 Essential (primary) hypertension: Secondary | ICD-10-CM | POA: Diagnosis not present

## 2020-08-25 DIAGNOSIS — Z1211 Encounter for screening for malignant neoplasm of colon: Secondary | ICD-10-CM | POA: Diagnosis not present

## 2020-08-25 DIAGNOSIS — Z85038 Personal history of other malignant neoplasm of large intestine: Secondary | ICD-10-CM | POA: Diagnosis not present

## 2020-08-25 DIAGNOSIS — Z79899 Other long term (current) drug therapy: Secondary | ICD-10-CM | POA: Diagnosis not present

## 2020-08-25 DIAGNOSIS — Z7984 Long term (current) use of oral hypoglycemic drugs: Secondary | ICD-10-CM | POA: Diagnosis not present

## 2020-08-25 DIAGNOSIS — Z7982 Long term (current) use of aspirin: Secondary | ICD-10-CM | POA: Diagnosis not present

## 2020-08-25 DIAGNOSIS — Z9049 Acquired absence of other specified parts of digestive tract: Secondary | ICD-10-CM | POA: Diagnosis not present

## 2020-08-25 DIAGNOSIS — K6389 Other specified diseases of intestine: Secondary | ICD-10-CM | POA: Diagnosis not present

## 2020-08-25 DIAGNOSIS — Z91041 Radiographic dye allergy status: Secondary | ICD-10-CM | POA: Diagnosis not present

## 2020-08-25 DIAGNOSIS — E119 Type 2 diabetes mellitus without complications: Secondary | ICD-10-CM | POA: Diagnosis not present

## 2020-08-25 HISTORY — PX: COLONOSCOPY: SHX174

## 2020-08-30 DIAGNOSIS — Z20828 Contact with and (suspected) exposure to other viral communicable diseases: Secondary | ICD-10-CM | POA: Diagnosis not present

## 2020-09-05 ENCOUNTER — Inpatient Hospital Stay (HOSPITAL_COMMUNITY): Payer: Medicare Other

## 2020-09-07 DIAGNOSIS — Z20828 Contact with and (suspected) exposure to other viral communicable diseases: Secondary | ICD-10-CM | POA: Diagnosis not present

## 2020-09-12 ENCOUNTER — Ambulatory Visit (HOSPITAL_COMMUNITY): Payer: Medicare Other | Admitting: Nurse Practitioner

## 2020-09-13 DIAGNOSIS — H34832 Tributary (branch) retinal vein occlusion, left eye, with macular edema: Secondary | ICD-10-CM | POA: Diagnosis not present

## 2020-09-13 DIAGNOSIS — Z961 Presence of intraocular lens: Secondary | ICD-10-CM | POA: Diagnosis not present

## 2020-09-13 DIAGNOSIS — E119 Type 2 diabetes mellitus without complications: Secondary | ICD-10-CM | POA: Diagnosis not present

## 2020-09-13 DIAGNOSIS — H40013 Open angle with borderline findings, low risk, bilateral: Secondary | ICD-10-CM | POA: Diagnosis not present

## 2020-09-19 ENCOUNTER — Other Ambulatory Visit: Payer: Self-pay

## 2020-09-19 ENCOUNTER — Inpatient Hospital Stay (HOSPITAL_COMMUNITY): Payer: Medicare Other | Attending: Hematology

## 2020-09-19 ENCOUNTER — Inpatient Hospital Stay (HOSPITAL_COMMUNITY): Payer: Medicare Other | Attending: Nurse Practitioner | Admitting: Nurse Practitioner

## 2020-09-19 DIAGNOSIS — D473 Essential (hemorrhagic) thrombocythemia: Secondary | ICD-10-CM

## 2020-09-19 DIAGNOSIS — Z87891 Personal history of nicotine dependence: Secondary | ICD-10-CM | POA: Insufficient documentation

## 2020-09-19 DIAGNOSIS — Z7984 Long term (current) use of oral hypoglycemic drugs: Secondary | ICD-10-CM | POA: Insufficient documentation

## 2020-09-19 DIAGNOSIS — R197 Diarrhea, unspecified: Secondary | ICD-10-CM | POA: Insufficient documentation

## 2020-09-19 DIAGNOSIS — Z7982 Long term (current) use of aspirin: Secondary | ICD-10-CM | POA: Diagnosis not present

## 2020-09-19 LAB — CBC WITH DIFFERENTIAL/PLATELET
Abs Immature Granulocytes: 0.16 10*3/uL — ABNORMAL HIGH (ref 0.00–0.07)
Basophils Absolute: 0.1 10*3/uL (ref 0.0–0.1)
Basophils Relative: 2 %
Eosinophils Absolute: 0.1 10*3/uL (ref 0.0–0.5)
Eosinophils Relative: 2 %
HCT: 37.4 % — ABNORMAL LOW (ref 39.0–52.0)
Hemoglobin: 11.6 g/dL — ABNORMAL LOW (ref 13.0–17.0)
Immature Granulocytes: 4 %
Lymphocytes Relative: 26 %
Lymphs Abs: 1 10*3/uL (ref 0.7–4.0)
MCH: 33.4 pg (ref 26.0–34.0)
MCHC: 31 g/dL (ref 30.0–36.0)
MCV: 107.8 fL — ABNORMAL HIGH (ref 80.0–100.0)
Monocytes Absolute: 0.4 10*3/uL (ref 0.1–1.0)
Monocytes Relative: 10 %
Neutro Abs: 2.2 10*3/uL (ref 1.7–7.7)
Neutrophils Relative %: 56 %
Platelets: 437 10*3/uL — ABNORMAL HIGH (ref 150–400)
RBC: 3.47 MIL/uL — ABNORMAL LOW (ref 4.22–5.81)
RDW: 17.7 % — ABNORMAL HIGH (ref 11.5–15.5)
WBC: 3.9 10*3/uL — ABNORMAL LOW (ref 4.0–10.5)
nRBC: 0 % (ref 0.0–0.2)

## 2020-09-19 LAB — COMPREHENSIVE METABOLIC PANEL
ALT: 26 U/L (ref 0–44)
AST: 17 U/L (ref 15–41)
Albumin: 4.3 g/dL (ref 3.5–5.0)
Alkaline Phosphatase: 32 U/L — ABNORMAL LOW (ref 38–126)
Anion gap: 10 (ref 5–15)
BUN: 12 mg/dL (ref 8–23)
CO2: 25 mmol/L (ref 22–32)
Calcium: 9.3 mg/dL (ref 8.9–10.3)
Chloride: 104 mmol/L (ref 98–111)
Creatinine, Ser: 1.19 mg/dL (ref 0.61–1.24)
GFR calc Af Amer: >60 mL/min (ref >60)
GFR calc non Af Amer: >60 mL/min (ref >60)
Glucose, Bld: 189 mg/dL — ABNORMAL HIGH (ref 70–99)
Potassium: 4.1 mmol/L (ref 3.5–5.1)
Sodium: 139 mmol/L (ref 135–145)
Total Bilirubin: 0.9 mg/dL (ref 0.3–1.2)
Total Protein: 6.6 g/dL (ref 6.5–8.1)

## 2020-09-19 LAB — LACTATE DEHYDROGENASE: LDH: 179 U/L (ref 98–192)

## 2020-09-19 NOTE — Progress Notes (Signed)
Justin Berger, Justin Berger   CLINIC:  Medical Oncology/Hematology  PCP:  Caryl Bis, MD [place] [address] [phone]   REASON FOR VISIT: Follow-up for essential thrombocytosis CALR+   CURRENT THERAPY: Hydrea  BRIEF ONCOLOGIC HISTORY:  Oncology History  Essential thrombocytosis ([place])  08/31/2011 Cancer Diagnosis   History of colon cancer, status post resection in 2012, status post adjuvant chemotherapy with 6 months of 5-FU and leucovorin under the direction of Dr.Karb at [facility]   09/29/2017 Imaging   CT scan of the abdomen done for nonspecific abdominal pain shows incidental splenomegaly  Patient referred to [place] for further workup   10/30/2017 Genetic Testing   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 male returns for routine follow-up for essential thrombocytosis CALR positive.  He denies any leg ulcers.  He denies any aqua genic pruritus.  He denies any headaches or vision changes.  He does report occasional diarrhea but thinks it is coming from his Metformin. Denies any nausea or vomiting. Denies any new pains. Had not noticed any recent bleeding such as epistaxis, hematuria or hematochezia. Denies recent chest pain on exertion, shortness of breath on minimal exertion, pre-syncopal episodes, or palpitations. Denies any numbness or tingling in hands or feet. Denies any recent fevers, infections, or recent hospitalizations. Patient reports appetite at  100% and energy level at 50%.  He is eating well maintaining her weight this time.    REVIEW OF SYSTEMS:  Review of Systems  Gastrointestinal: Positive for diarrhea.  All other systems reviewed and are negative.    PAST MEDICAL/SURGICAL HISTORY:  Past Medical History:  Diagnosis Date   Diabetes ([place])    Hypertension    No past surgical history on file.   SOCIAL HISTORY:  Social History   Socioeconomic History   Marital status: Married    Spouse name: Not on file   Number of children: Not on file   Years of education: Not on file   Highest education level: Not on file  Occupational History   Not on file  Tobacco Use   Smoking status: Former Smoker    Packs/day: 0.50    Years: 3.00    Pack years: 1.50    Types: Cigarettes    Quit date: 04/08/1978    Years since quitting: 42.4   Smokeless tobacco: Never Used  Vaping Use   Vaping Use: Never used  Substance and Sexual Activity   Alcohol use: Never   Drug use: Never   Sexual activity: Not on file  Other Topics Concern   Not on file  Social History Narrative   Not on file   Social Determinants of Health   Financial Resource Strain:    Difficulty of Paying Living Expenses: Not on file  Food Insecurity:    Worried About Charity fundraiser in the Last Year: Not on file   [place] in the Last Year: Not on file  Transportation Needs:    Lack of Transportation (Medical): Not on file  Lack of Transportation (Non-Medical): Not on file  Physical Activity:    Days of Exercise per Week: Not on file   Minutes of Exercise per Session: Not on file  Stress:    Feeling of Stress : Not on file  Social Connections:    Frequency of Communication with Friends and Family: Not on file   Frequency of Social Gatherings with Friends and Family: Not on file   Attends Religious Services: Not on file   Active Member of Clubs or Organizations: Not on file   Attends Archivist Meetings:  Not on file   Marital Status: Not on file  Intimate Partner Violence:    Fear of Current or Ex-Partner: Not on file   Emotionally Abused: Not on file   Physically Abused: Not on file   Sexually Abused: Not on file    FAMILY HISTORY:  Family History  Problem Relation Age of Onset   Dementia Mother    Cancer Paternal Uncle        lung    CURRENT MEDICATIONS:  Outpatient Encounter Medications as of 09/19/2020  Medication Sig   ACCU-CHEK AVIVA PLUS test strip TEST BLOOD SUGAR D   allopurinol (ZYLOPRIM) 300 MG tablet Take 300 mg by mouth daily.   aspirin 81 MG EC tablet Take by mouth.   atorvastatin (LIPITOR) 10 MG tablet Take 10 mg by mouth once a week.   clonazePAM (KLONOPIN) 0.5 MG tablet Take 0.5 mg by mouth 2 (two) times daily as needed.    cyclobenzaprine (FLEXERIL) 10 MG tablet Take 10 mg by mouth as needed.    gabapentin (NEURONTIN) 300 MG capsule Take 300 mg by mouth at bedtime.   glipiZIDE (GLUCOTROL XL) 10 MG 24 hr tablet Take 10 mg by mouth daily.   hydroxyurea (HYDREA) 500 MG capsule Take 1 capsule (500 mg total) by mouth daily. Take 3 tablets Monday through Friday. Take 2 tablets on Saturday and Sunday   metFORMIN (GLUCOPHAGE-XR) 500 MG 24 hr tablet Take 500 mg by mouth daily.    metoprolol succinate (TOPROL-XL) 50 MG 24 hr tablet Take 50 mg by mouth daily.    promethazine (PHENERGAN) 12.5 MG tablet Take 12.5 mg by mouth as needed.    sildenafil (VIAGRA) 50 MG tablet Take 1 tablet (50 mg total) by mouth daily as needed for erectile dysfunction.   No facility-administered encounter medications on file as of 09/19/2020.    ALLERGIES:  Allergies  Allergen Reactions   Ivp Dye  [Iodinated Diagnostic Agents]      PHYSICAL EXAM:  ECOG Performance status: 1  Vitals:   09/19/20 0953  BP: 133/79  Pulse: 64  Resp: 18  Temp: (!) 97.1 F (36.2 C)  SpO2: 99%   Filed Weights   09/19/20 0953  Weight: 250 lb 6.4 oz (113.6 kg)     Physical  Exam Constitutional:      Appearance: Normal appearance. He is normal weight.  Cardiovascular:     Rate and Rhythm: Normal rate and regular rhythm.     Heart sounds: Normal heart sounds.  Pulmonary:     Effort: Pulmonary effort is normal.     Breath sounds: Normal breath sounds.  Abdominal:     General: Bowel sounds are normal.     Palpations: Abdomen is soft.  Musculoskeletal:        General: Normal range of motion.  Skin:    General: Skin is warm.  Neurological:     Mental Status: He is alert  and oriented to person, place, and time. Mental status is at baseline.  Psychiatric:        Mood and Affect: Mood normal.        Behavior: Behavior normal.        Thought Content: Thought content normal.        Judgment: Judgment normal.      LABORATORY DATA:  I have reviewed the labs as listed.  CBC    Component Value Date/Time   WBC 3.9 (L) 09/19/2020 0905   RBC 3.47 (L) 09/19/2020 0905   HGB 11.6 (L) 09/19/2020 0905   HCT 37.4 (L) 09/19/2020 0905   PLT 437 (H) 09/19/2020 0905   MCV 107.8 (H) 09/19/2020 0905   MCH 33.4 09/19/2020 0905   MCHC 31.0 09/19/2020 0905   RDW 17.7 (H) 09/19/2020 0905   LYMPHSABS 1.0 09/19/2020 0905   MONOABS 0.4 09/19/2020 0905   EOSABS 0.1 09/19/2020 0905   BASOSABS 0.1 09/19/2020 0905   CMP Latest Ref Rng & Units 09/19/2020 06/21/2020 03/21/2020  Glucose 70 - 99 mg/dL 189(H) 127(H) 157(H)  BUN 8 - 23 mg/dL 12 15 16   Creatinine 0.61 - 1.24 mg/dL 1.19 1.13 1.19  Sodium 135 - 145 mmol/L 139 137 138  Potassium 3.5 - 5.1 mmol/L 4.1 4.0 4.1  Chloride 98 - 111 mmol/L 104 106 105  CO2 22 - 32 mmol/L 25 21(L) 23  Calcium 8.9 - 10.3 mg/dL 9.3 9.6 9.3  Total Protein 6.5 - 8.1 g/dL 6.6 7.0 6.8  Total Bilirubin 0.3 - 1.2 mg/dL 0.9 0.7 0.8  Alkaline Phos 38 - 126 U/L 32(L) 39 35(L)  AST 15 - 41 U/L 17 18 17   ALT 0 - 44 U/L 26 31 30     All questions were answered to patient's stated satisfaction. Encouraged patient to call with any new concerns or  questions before his next visit to the cancer center and we can certain see him sooner, if needed.     ASSESSMENT & PLAN:  Essential thrombocytosis ([place]) 1.  Essential thrombocytosis, CALR+: -High risk disease with history of thrombosis (left leg DVT) and age more than 96. -No vasomotor symptoms.  No aquagenic pruritus. -Hydroxyurea started in December 2018.  He is on aspirin 81 mg daily. -He is currently taking hydroxyurea 3 tablets on Mondays Wednesdays and Fridays in the mornings and 2 tablets the rest of the week. -He reports having diarrhea about 3-4 times every morning.  He was questioning the hydroxyurea but also takes Metformin as well. -Labs done on 09/19/2020 showed platelets 437 hemoglobin 11.6, hematocrit 37.4, MCV 107.8, and WBC 3.9 -He will continue to take 3 tablets Monday through Friday and take 2 tablets on the weekends. -We will reevaluate him in 10 weeks with repeat labs.      Orders placed this encounter:  Orders Placed This Encounter  Procedures   Lactate dehydrogenase   CBC with Differential/Platelet   Comprehensive metabolic panel     Francene Finders, FNP-C [place] [phone]

## 2020-09-19 NOTE — Assessment & Plan Note (Addendum)
1.  Essential thrombocytosis, CALR+: -High risk disease with history of thrombosis (left leg DVT) and age more than 31. -No vasomotor symptoms.  No aquagenic pruritus. -Hydroxyurea started in December 2018.  He is on aspirin 81 mg daily. -He is currently taking hydroxyurea 3 tablets on Mondays Wednesdays and Fridays in the mornings and 2 tablets the rest of the week. -He reports having diarrhea about 3-4 times every morning.  He was questioning the hydroxyurea but also takes Metformin as well. -Labs done on 09/19/2020 showed platelets 437 hemoglobin 11.6, hematocrit 37.4, MCV 107.8, and WBC 3.9 -He will continue to take 3 tablets Monday through Friday and take 2 tablets on the weekends. -We will reevaluate him in 10 weeks with repeat labs.

## 2020-09-21 DIAGNOSIS — Z23 Encounter for immunization: Secondary | ICD-10-CM | POA: Diagnosis not present

## 2020-10-30 ENCOUNTER — Encounter (HOSPITAL_COMMUNITY): Payer: Self-pay

## 2020-10-30 ENCOUNTER — Inpatient Hospital Stay (HOSPITAL_COMMUNITY): Payer: Medicare Other | Attending: Hematology

## 2020-10-30 ENCOUNTER — Other Ambulatory Visit (HOSPITAL_COMMUNITY): Payer: Self-pay

## 2020-10-30 ENCOUNTER — Telehealth (HOSPITAL_COMMUNITY): Payer: Self-pay

## 2020-10-30 ENCOUNTER — Other Ambulatory Visit: Payer: Self-pay

## 2020-10-30 DIAGNOSIS — D473 Essential (hemorrhagic) thrombocythemia: Secondary | ICD-10-CM | POA: Diagnosis not present

## 2020-10-30 LAB — CBC WITH DIFFERENTIAL/PLATELET
Abs Immature Granulocytes: 0.24 10*3/uL — ABNORMAL HIGH (ref 0.00–0.07)
Basophils Absolute: 0.1 10*3/uL (ref 0.0–0.1)
Basophils Relative: 2 %
Eosinophils Absolute: 0 10*3/uL (ref 0.0–0.5)
Eosinophils Relative: 1 %
HCT: 36 % — ABNORMAL LOW (ref 39.0–52.0)
Hemoglobin: 11.8 g/dL — ABNORMAL LOW (ref 13.0–17.0)
Immature Granulocytes: 5 %
Lymphocytes Relative: 24 %
Lymphs Abs: 1.1 10*3/uL (ref 0.7–4.0)
MCH: 34.6 pg — ABNORMAL HIGH (ref 26.0–34.0)
MCHC: 32.8 g/dL (ref 30.0–36.0)
MCV: 105.6 fL — ABNORMAL HIGH (ref 80.0–100.0)
Monocytes Absolute: 0.5 10*3/uL (ref 0.1–1.0)
Monocytes Relative: 10 %
Neutro Abs: 2.7 10*3/uL (ref 1.7–7.7)
Neutrophils Relative %: 58 %
Platelets: 497 10*3/uL — ABNORMAL HIGH (ref 150–400)
RBC: 3.41 MIL/uL — ABNORMAL LOW (ref 4.22–5.81)
RDW: 16.5 % — ABNORMAL HIGH (ref 11.5–15.5)
WBC: 4.6 10*3/uL (ref 4.0–10.5)
nRBC: 0.4 % — ABNORMAL HIGH (ref 0.0–0.2)

## 2020-10-30 NOTE — Telephone Encounter (Signed)
Patient called stating that his energy levels are really low and he wants to know what he can do to increase them. He states that his blood counts were low that he had done the end of September.   Dr. Tomie China response- please ask him to come and check CBCD today or tomorrow. so that we can see if his hemoglobin is further down.  Patient is agreeable and scheduled for lab appointment at 1320 on 10/30/2020 for CBCD.

## 2020-10-31 ENCOUNTER — Telehealth (HOSPITAL_COMMUNITY): Payer: Self-pay | Admitting: Surgery

## 2020-10-31 NOTE — Telephone Encounter (Signed)
Pt left a voicemail asking if Dr. Delton Coombes had reviewed his labs from yesterday, since he has been so tired lately and wanted to know if his hemoglobin level had decreased.  Per Dr. Delton Coombes, the pt's labs looked fine and the results did not show why the pt should be so tired.  Dr. Delton Coombes asked if the pt could be seen by his primary MD, and the pt stated that he did not want to go to his primary MD now with Covid.  The pt has an appointment with our office on December 1, and Dr. Delton Coombes stated that the pt did not need to come in earlier unless he was having any other problems.  I called the pt back and he verbalized understanding.

## 2020-11-01 DIAGNOSIS — Z961 Presence of intraocular lens: Secondary | ICD-10-CM | POA: Diagnosis not present

## 2020-11-01 DIAGNOSIS — H34832 Tributary (branch) retinal vein occlusion, left eye, with macular edema: Secondary | ICD-10-CM | POA: Diagnosis not present

## 2020-11-01 DIAGNOSIS — H40013 Open angle with borderline findings, low risk, bilateral: Secondary | ICD-10-CM | POA: Diagnosis not present

## 2020-11-01 DIAGNOSIS — E119 Type 2 diabetes mellitus without complications: Secondary | ICD-10-CM | POA: Diagnosis not present

## 2020-11-21 DIAGNOSIS — J329 Chronic sinusitis, unspecified: Secondary | ICD-10-CM | POA: Diagnosis not present

## 2020-11-21 DIAGNOSIS — R059 Cough, unspecified: Secondary | ICD-10-CM | POA: Diagnosis not present

## 2020-11-21 DIAGNOSIS — Z20828 Contact with and (suspected) exposure to other viral communicable diseases: Secondary | ICD-10-CM | POA: Diagnosis not present

## 2020-11-29 ENCOUNTER — Inpatient Hospital Stay (HOSPITAL_COMMUNITY): Payer: Medicare Other | Attending: Hematology

## 2020-11-29 ENCOUNTER — Encounter (HOSPITAL_COMMUNITY): Payer: Self-pay | Admitting: Hematology

## 2020-11-29 ENCOUNTER — Other Ambulatory Visit: Payer: Self-pay

## 2020-11-29 ENCOUNTER — Inpatient Hospital Stay (HOSPITAL_BASED_OUTPATIENT_CLINIC_OR_DEPARTMENT_OTHER): Payer: Medicare Other | Admitting: Hematology

## 2020-11-29 VITALS — BP 142/87 | HR 90 | Temp 98.3°F | Resp 17 | Wt 251.4 lb

## 2020-11-29 DIAGNOSIS — Z86718 Personal history of other venous thrombosis and embolism: Secondary | ICD-10-CM | POA: Diagnosis not present

## 2020-11-29 DIAGNOSIS — Z79899 Other long term (current) drug therapy: Secondary | ICD-10-CM | POA: Insufficient documentation

## 2020-11-29 DIAGNOSIS — D473 Essential (hemorrhagic) thrombocythemia: Secondary | ICD-10-CM | POA: Diagnosis not present

## 2020-11-29 DIAGNOSIS — Z87891 Personal history of nicotine dependence: Secondary | ICD-10-CM | POA: Diagnosis not present

## 2020-11-29 DIAGNOSIS — N529 Male erectile dysfunction, unspecified: Secondary | ICD-10-CM | POA: Diagnosis not present

## 2020-11-29 DIAGNOSIS — K1370 Unspecified lesions of oral mucosa: Secondary | ICD-10-CM | POA: Insufficient documentation

## 2020-11-29 LAB — CBC WITH DIFFERENTIAL/PLATELET
Abs Immature Granulocytes: 0.3 10*3/uL — ABNORMAL HIGH (ref 0.00–0.07)
Basophils Absolute: 0.1 10*3/uL (ref 0.0–0.1)
Basophils Relative: 1 %
Eosinophils Absolute: 0 10*3/uL (ref 0.0–0.5)
Eosinophils Relative: 1 %
HCT: 37.2 % — ABNORMAL LOW (ref 39.0–52.0)
Hemoglobin: 12 g/dL — ABNORMAL LOW (ref 13.0–17.0)
Immature Granulocytes: 6 %
Lymphocytes Relative: 22 %
Lymphs Abs: 1.1 10*3/uL (ref 0.7–4.0)
MCH: 34.4 pg — ABNORMAL HIGH (ref 26.0–34.0)
MCHC: 32.3 g/dL (ref 30.0–36.0)
MCV: 106.6 fL — ABNORMAL HIGH (ref 80.0–100.0)
Monocytes Absolute: 0.5 10*3/uL (ref 0.1–1.0)
Monocytes Relative: 9 %
Neutro Abs: 3 10*3/uL (ref 1.7–7.7)
Neutrophils Relative %: 61 %
Platelets: 458 10*3/uL — ABNORMAL HIGH (ref 150–400)
RBC: 3.49 MIL/uL — ABNORMAL LOW (ref 4.22–5.81)
RDW: 16.6 % — ABNORMAL HIGH (ref 11.5–15.5)
WBC: 5 10*3/uL (ref 4.0–10.5)
nRBC: 0.6 % — ABNORMAL HIGH (ref 0.0–0.2)

## 2020-11-29 LAB — LACTATE DEHYDROGENASE: LDH: 220 U/L — ABNORMAL HIGH (ref 98–192)

## 2020-11-29 LAB — COMPREHENSIVE METABOLIC PANEL
ALT: 34 U/L (ref 0–44)
AST: 21 U/L (ref 15–41)
Albumin: 4.5 g/dL (ref 3.5–5.0)
Alkaline Phosphatase: 38 U/L (ref 38–126)
Anion gap: 10 (ref 5–15)
BUN: 17 mg/dL (ref 8–23)
CO2: 21 mmol/L — ABNORMAL LOW (ref 22–32)
Calcium: 9.6 mg/dL (ref 8.9–10.3)
Chloride: 103 mmol/L (ref 98–111)
Creatinine, Ser: 1.22 mg/dL (ref 0.61–1.24)
GFR, Estimated: >60 mL/min (ref >60)
Glucose, Bld: 220 mg/dL — ABNORMAL HIGH (ref 70–99)
Potassium: 4.1 mmol/L (ref 3.5–5.1)
Sodium: 134 mmol/L — ABNORMAL LOW (ref 135–145)
Total Bilirubin: 0.9 mg/dL (ref 0.3–1.2)
Total Protein: 6.8 g/dL (ref 6.5–8.1)

## 2020-11-29 LAB — TSH: TSH: 2.322 u[IU]/mL (ref 0.350–4.500)

## 2020-11-29 MED ORDER — HYDROXYUREA 500 MG PO CAPS: 1000.0000 mg | ORAL_CAPSULE | Freq: Every day | ORAL | 1 refills | Status: DC

## 2020-11-29 NOTE — Patient Instructions (Addendum)
[place] at [facility] Discharge Instructions  You were seen today by Dr. Delton Coombes. He went over your recent results. Start taking 2 tablets of Hydrea daily. Dr. Delton Coombes will see you back in 3 months for labs and follow up.   Thank you for choosing [place] at [facility] to provide your oncology and hematology care.  To afford each patient quality time with our provider, please arrive at least 15 minutes before your scheduled appointment time.   If you have a lab appointment with the [facility] please come in thru the Main Entrance and check in at the main information desk  You need to re-schedule your appointment should you arrive 10 or more minutes late.  We strive to give you quality time with our providers, and arriving late affects you and other patients whose appointments are after yours.  Also, if you no show three or more times for appointments you may be dismissed from the clinic at the providers discretion.     Again, thank you for choosing [facility].  Our hope is that these requests will decrease the amount of time that you wait before being seen by our physicians.       _____________________________________________________________  Should you have questions after your visit to [facility], please contact our office at [phone] between the hours of 8:00 a.m. and 4:30 p.m.  Voicemails left after 4:00 p.m. will not be returned until the following business day.  For prescription refill requests, have your pharmacy contact our office and allow 72 hours.    Cancer Center Support Programs:   > Cancer Support Group  2nd Tuesday of the month 1pm-2pm, Journey Room

## 2020-11-29 NOTE — Progress Notes (Signed)
Justin Berger, [place] [address]   CLINIC:  Medical Oncology/Hematology  PCP:  Caryl Bis, MD [address] [place] [address]  [phone]  REASON FOR VISIT:  Follow-up for essential thrombocytosis  PRIOR THERAPY: None  CURRENT THERAPY: Hydrea 3 tablets on M,W,F, 2 tablets all over days  INTERVAL HISTORY:  Mr. Justin Berger, a 69 y.o. male, returns for routine follow-up for his essential thrombocytosis. Justin Berger was last seen on 03/28/2020.  Today he reports feeling okay. He reports that his energy levels are down 50% and he does not feel like doing anything. His energy levels have been low since 11/1. He has not been sick with COVID and denies having any other infections, fevers or chills. He continues taking ASA 81 daily. His appetite is excellent. He reports having intermittent issues with his balance, but denies having tinnitus or hearing issues.  He received his flu and COVID booster sometime in September. He will have a follow-up with his PCP along with blood work on 12/3.   REVIEW OF SYSTEMS:  Review of Systems  Constitutional: Positive for appetite change (75%) and fatigue (50%). Negative for chills and fever.  HENT:   Negative for hearing loss and tinnitus.   Respiratory: Positive for shortness of breath.   Musculoskeletal: Positive for gait problem (vertigo).  Neurological: Positive for gait problem (vertigo).  All other systems reviewed and are negative.   PAST MEDICAL/SURGICAL HISTORY:  Past Medical History:  Diagnosis Date  . Diabetes ([place])   . Hypertension    No past surgical history on file.  SOCIAL HISTORY:  Social History   Socioeconomic History  . Marital status: Married    Spouse name: Not on file  . Number of children: Not on file  . Years of education: Not on file  . Highest education level: Not on file  Occupational History  . Not on file  Tobacco Use  . Smoking status: Former Smoker    Packs/day:  0.50    Years: 3.00    Pack years: 1.50    Types: Cigarettes    Quit date: 04/08/1978    Years since quitting: 42.6  . Smokeless tobacco: Never Used  Vaping Use  . Vaping Use: Never used  Substance and Sexual Activity  . Alcohol use: Never  . Drug use: Never  . Sexual activity: Not on file  Other Topics Concern  . Not on file  Social History Narrative  . Not on file   Social Determinants of Health   Financial Resource Strain: Low Risk   . Difficulty of Paying Living Expenses: Not hard at all  Food Insecurity: No Food Insecurity  . Worried About Charity fundraiser in the Last Year: Never true  . Ran Out of Food in the Last Year: Never true  Transportation Needs: No Transportation Needs  . Lack of Transportation (Medical): No  . Lack of Transportation (Non-Medical): No  Physical Activity: Inactive  . Days of Exercise per Week: 0 days  . Minutes of Exercise per Session: 0 min  Stress: No Stress Concern Present  . Feeling of Stress : Not at all  Social Connections: Moderately Integrated  . Frequency of Communication with Friends and Family: More than three times a week  . Frequency of Social Gatherings with Friends and Family: Three times a week  . Attends Religious Services: 1 to 4 times per year  . Active Member of Clubs or Organizations: No  .  Attends Archivist Meetings: Never  . Marital Status: Married  Human resources officer Violence: Not At Risk  . Fear of Current or Ex-Partner: No  . Emotionally Abused: No  . Physically Abused: No  . Sexually Abused: No    FAMILY HISTORY:  Family History  Problem Relation Age of Onset  . Dementia Mother   . Cancer Paternal Uncle        lung    CURRENT MEDICATIONS:  Current Outpatient Medications  Medication Sig Dispense Refill  . ACCU-CHEK AVIVA PLUS test strip TEST BLOOD SUGAR D  3  . allopurinol (ZYLOPRIM) 300 MG tablet Take 300 mg by mouth daily.    [name] aspirin 81 MG EC tablet Take by mouth.    [name] atorvastatin  (LIPITOR) 10 MG tablet Take 10 mg by mouth once a week.    . clonazePAM (KLONOPIN) 0.5 MG tablet Take 0.5 mg by mouth 2 (two) times daily as needed.     . cyclobenzaprine (FLEXERIL) 10 MG tablet Take 10 mg by mouth as needed.     . gabapentin (NEURONTIN) 300 MG capsule Take 300 mg by mouth at bedtime.    [name] glipiZIDE (GLUCOTROL XL) 10 MG 24 hr tablet Take 10 mg by mouth daily.    . hydroxyurea (HYDREA) 500 MG capsule Take 2 capsules (1,000 mg total) by mouth daily. Take 3 tablets Monday through Friday. Take 2 tablets on Saturday and Sunday 180 capsule 1  . metFORMIN (GLUCOPHAGE-XR) 500 MG 24 hr tablet Take 500 mg by mouth daily.   6  . metoprolol succinate (TOPROL-XL) 50 MG 24 hr tablet Take 50 mg by mouth daily.   3  . promethazine (PHENERGAN) 12.5 MG tablet Take 12.5 mg by mouth as needed.   0  . sildenafil (VIAGRA) 50 MG tablet Take 1 tablet (50 mg total) by mouth daily as needed for erectile dysfunction. 10 tablet 0   No current facility-administered medications for this visit.    ALLERGIES:  Allergies  Allergen Reactions  . Ivp Dye  [Iodinated Diagnostic Agents]     PHYSICAL EXAM:  Performance status (ECOG): 1 - Symptomatic but completely ambulatory  Vitals:   11/29/20 1433 11/29/20 1442  BP: (!) 142/87 (!) 142/87  Pulse: 90 90  Resp: 17 17  Temp: 98.3 F (36.8 C) 98.3 F (36.8 C)  SpO2: 99%    Wt Readings from Last 3 Encounters:  11/29/20 251 lb 6 oz (114 kg)  09/19/20 250 lb 6.4 oz (113.6 kg)  06/28/20 251 lb 14.4 oz (114.3 kg)   Physical Exam Vitals reviewed.  Constitutional:      Appearance: Normal appearance. He is obese.  Cardiovascular:     Rate and Rhythm: Normal rate and regular rhythm.     Pulses: Normal pulses.     Heart sounds: Normal heart sounds.  Pulmonary:     Effort: Pulmonary effort is normal.     Breath sounds: Normal breath sounds.  Abdominal:     Palpations: Abdomen is soft. There is no hepatomegaly or mass.     Tenderness: There is  abdominal tenderness in the right upper quadrant.     Hernia: No hernia is present.  Lymphadenopathy:     Upper Body:     Right upper body: No supraclavicular, axillary or pectoral adenopathy.     Left upper body: No supraclavicular, axillary or pectoral adenopathy.  Neurological:     General: No focal deficit present.     Mental Status: He is alert  and oriented to person, place, and time.  Psychiatric:        Mood and Affect: Mood normal.        Behavior: Behavior normal.     LABORATORY DATA:  I have reviewed the labs as listed.  CBC Latest Ref Rng & Units 11/29/2020 10/30/2020 09/19/2020  WBC 4.0 - 10.5 K/uL 5.0 4.6 3.9(L)  Hemoglobin 13.0 - 17.0 g/dL 12.0(L) 11.8(L) 11.6(L)  Hematocrit 39 - 52 % 37.2(L) 36.0(L) 37.4(L)  Platelets 150 - 400 K/uL 458(H) 497(H) 437(H)   CMP Latest Ref Rng & Units 11/29/2020 09/19/2020 06/21/2020  Glucose 70 - 99 mg/dL 220(H) 189(H) 127(H)  BUN 8 - 23 mg/dL 17 12 15   Creatinine 0.61 - 1.24 mg/dL 1.22 1.19 1.13  Sodium 135 - 145 mmol/L 134(L) 139 137  Potassium 3.5 - 5.1 mmol/L 4.1 4.1 4.0  Chloride 98 - 111 mmol/L 103 104 106  CO2 22 - 32 mmol/L 21(L) 25 21(L)  Calcium 8.9 - 10.3 mg/dL 9.6 9.3 9.6  Total Protein 6.5 - 8.1 g/dL 6.8 6.6 7.0  Total Bilirubin 0.3 - 1.2 mg/dL 0.9 0.9 0.7  Alkaline Phos 38 - 126 U/L 38 32(L) 39  AST 15 - 41 U/L 21 17 18   ALT 0 - 44 U/L 34 26 31      Component Value Date/Time   RBC 3.49 (L) 11/29/2020 1319   MCV 106.6 (H) 11/29/2020 1319   MCH 34.4 (H) 11/29/2020 1319   MCHC 32.3 11/29/2020 1319   RDW 16.6 (H) 11/29/2020 1319   LYMPHSABS 1.1 11/29/2020 1319   MONOABS 0.5 11/29/2020 1319   EOSABS 0.0 11/29/2020 1319   BASOSABS 0.1 11/29/2020 1319   Lab Results  Component Value Date   LDH 220 (H) 11/29/2020   LDH 179 09/19/2020   LDH 220 (H) 06/21/2020    DIAGNOSTIC IMAGING:  I have independently reviewed the scans and discussed with the patient. No results found.   ASSESSMENT:  1.  Essential  thrombocytosis, CALR+: -High risk disease with history of thrombosis (left leg DVT) and age more than 66. -No vasomotor symptoms.  No echogenic pruritus. -Hydroxyurea started in December 2018.  He is on aspirin 81 mg daily. -He is currently taking hydroxyurea 3 tablets daily Monday through Friday and 2 tablets daily on Saturday and Sunday.   2.  Right oral mucosal lesion: -He follows up with Dr. Benjamine Mola.  Lesion was felt to be hemangioma-like 1 cm stable lesion with no surface ulceration.  3.  Erectile dysfunction: -He reports decreased erections.  I talked to him about starting him on Viagra 50 mg as needed.  I asked him to not to take it together with his blood pressure medication.   PLAN:  1.  Essential thrombocytosis, CALR+: -We reviewed labs from today.  Platelet count is 458 with normal hematocrit and white count. -Patient currently taking 3 tablets of hydroxyurea Monday through Friday and 2 tablets over the weekend. -He reports severe fatigue for the past couple of months.  We have checked his labs on 10/30/2020 which showed normal hemoglobin of 11.8.  Today hemoglobin is 12. -Physical examination shows no palpable adenopathy.  No splenomegaly.  Right upper quadrant vague tenderness with no mass palpable. -He thinks the fatigue started when we upped the dose of Hydrea. -I have told him to cut back on hydroxyurea to 2 tablets daily. -We will also check TSH and testosterone levels for his fatigue.  No weight loss reported. -RTC 3 months with labs.  Orders placed this encounter:  Orders Placed This Encounter  Procedures  . TSH     Derek Jack, MD [place] [phone]   I, Milinda Antis, am acting as a scribe for Dr. Sanda Linger.  I, Derek Jack MD, have reviewed the above documentation for accuracy and completeness, and I agree with the above.

## 2020-11-30 LAB — TESTOSTERONE: Testosterone: 301 ng/dL (ref 264–916)

## 2020-12-01 ENCOUNTER — Encounter (HOSPITAL_COMMUNITY): Payer: Self-pay | Admitting: *Deleted

## 2020-12-01 DIAGNOSIS — Z9189 Other specified personal risk factors, not elsewhere classified: Secondary | ICD-10-CM | POA: Diagnosis not present

## 2020-12-01 DIAGNOSIS — G6289 Other specified polyneuropathies: Secondary | ICD-10-CM | POA: Diagnosis not present

## 2020-12-01 DIAGNOSIS — G252 Other specified forms of tremor: Secondary | ICD-10-CM | POA: Diagnosis not present

## 2020-12-01 DIAGNOSIS — I10 Essential (primary) hypertension: Secondary | ICD-10-CM | POA: Diagnosis not present

## 2020-12-01 DIAGNOSIS — D473 Essential (hemorrhagic) thrombocythemia: Secondary | ICD-10-CM | POA: Diagnosis not present

## 2020-12-01 DIAGNOSIS — Z6837 Body mass index (BMI) 37.0-37.9, adult: Secondary | ICD-10-CM | POA: Diagnosis not present

## 2020-12-01 DIAGNOSIS — M109 Gout, unspecified: Secondary | ICD-10-CM | POA: Diagnosis not present

## 2020-12-01 DIAGNOSIS — R4582 Worries: Secondary | ICD-10-CM | POA: Diagnosis not present

## 2020-12-01 DIAGNOSIS — E1142 Type 2 diabetes mellitus with diabetic polyneuropathy: Secondary | ICD-10-CM | POA: Diagnosis not present

## 2020-12-01 DIAGNOSIS — E782 Mixed hyperlipidemia: Secondary | ICD-10-CM | POA: Diagnosis not present

## 2020-12-01 NOTE — Progress Notes (Signed)
I spoke with patient via telephone and advised of TSH and Testosterone levels being within normal range.  Nothing added at this time per Dr. Delton Coombes.

## 2020-12-13 DIAGNOSIS — H34832 Tributary (branch) retinal vein occlusion, left eye, with macular edema: Secondary | ICD-10-CM | POA: Diagnosis not present

## 2020-12-13 DIAGNOSIS — E119 Type 2 diabetes mellitus without complications: Secondary | ICD-10-CM | POA: Diagnosis not present

## 2020-12-13 DIAGNOSIS — H40013 Open angle with borderline findings, low risk, bilateral: Secondary | ICD-10-CM | POA: Diagnosis not present

## 2020-12-13 DIAGNOSIS — Z961 Presence of intraocular lens: Secondary | ICD-10-CM | POA: Diagnosis not present

## 2021-01-24 DIAGNOSIS — Z961 Presence of intraocular lens: Secondary | ICD-10-CM | POA: Diagnosis not present

## 2021-01-24 DIAGNOSIS — H40013 Open angle with borderline findings, low risk, bilateral: Secondary | ICD-10-CM | POA: Diagnosis not present

## 2021-01-24 DIAGNOSIS — E119 Type 2 diabetes mellitus without complications: Secondary | ICD-10-CM | POA: Diagnosis not present

## 2021-01-24 DIAGNOSIS — H26492 Other secondary cataract, left eye: Secondary | ICD-10-CM | POA: Diagnosis not present

## 2021-01-24 DIAGNOSIS — H34832 Tributary (branch) retinal vein occlusion, left eye, with macular edema: Secondary | ICD-10-CM | POA: Diagnosis not present

## 2021-01-30 DIAGNOSIS — I714 Abdominal aortic aneurysm, without rupture, unspecified: Secondary | ICD-10-CM

## 2021-01-30 HISTORY — DX: Abdominal aortic aneurysm, without rupture, unspecified: I71.40

## 2021-02-03 DIAGNOSIS — E1142 Type 2 diabetes mellitus with diabetic polyneuropathy: Secondary | ICD-10-CM | POA: Diagnosis not present

## 2021-02-03 DIAGNOSIS — S93402A Sprain of unspecified ligament of left ankle, initial encounter: Secondary | ICD-10-CM | POA: Diagnosis not present

## 2021-02-03 DIAGNOSIS — S83412A Sprain of medial collateral ligament of left knee, initial encounter: Secondary | ICD-10-CM | POA: Diagnosis not present

## 2021-02-05 DIAGNOSIS — S93409A Sprain of unspecified ligament of unspecified ankle, initial encounter: Secondary | ICD-10-CM | POA: Diagnosis not present

## 2021-02-05 DIAGNOSIS — M25569 Pain in unspecified knee: Secondary | ICD-10-CM | POA: Diagnosis not present

## 2021-02-05 DIAGNOSIS — R319 Hematuria, unspecified: Secondary | ICD-10-CM | POA: Diagnosis not present

## 2021-02-06 DIAGNOSIS — C19 Malignant neoplasm of rectosigmoid junction: Secondary | ICD-10-CM | POA: Diagnosis not present

## 2021-02-06 DIAGNOSIS — M25462 Effusion, left knee: Secondary | ICD-10-CM | POA: Diagnosis not present

## 2021-02-06 DIAGNOSIS — Z9181 History of falling: Secondary | ICD-10-CM | POA: Diagnosis not present

## 2021-02-06 DIAGNOSIS — Z85820 Personal history of malignant melanoma of skin: Secondary | ICD-10-CM | POA: Diagnosis not present

## 2021-02-06 DIAGNOSIS — R319 Hematuria, unspecified: Secondary | ICD-10-CM | POA: Diagnosis not present

## 2021-02-06 DIAGNOSIS — E669 Obesity, unspecified: Secondary | ICD-10-CM | POA: Diagnosis not present

## 2021-02-06 DIAGNOSIS — R2689 Other abnormalities of gait and mobility: Secondary | ICD-10-CM | POA: Diagnosis not present

## 2021-02-06 DIAGNOSIS — Z7984 Long term (current) use of oral hypoglycemic drugs: Secondary | ICD-10-CM | POA: Diagnosis not present

## 2021-02-06 DIAGNOSIS — S93402D Sprain of unspecified ligament of left ankle, subsequent encounter: Secondary | ICD-10-CM | POA: Diagnosis not present

## 2021-02-06 DIAGNOSIS — N189 Chronic kidney disease, unspecified: Secondary | ICD-10-CM | POA: Diagnosis not present

## 2021-02-06 DIAGNOSIS — W19XXXD Unspecified fall, subsequent encounter: Secondary | ICD-10-CM | POA: Diagnosis not present

## 2021-02-06 DIAGNOSIS — E7800 Pure hypercholesterolemia, unspecified: Secondary | ICD-10-CM | POA: Diagnosis not present

## 2021-02-06 DIAGNOSIS — E1122 Type 2 diabetes mellitus with diabetic chronic kidney disease: Secondary | ICD-10-CM | POA: Diagnosis not present

## 2021-02-06 DIAGNOSIS — Z6837 Body mass index (BMI) 37.0-37.9, adult: Secondary | ICD-10-CM | POA: Diagnosis not present

## 2021-02-06 DIAGNOSIS — I129 Hypertensive chronic kidney disease with stage 1 through stage 4 chronic kidney disease, or unspecified chronic kidney disease: Secondary | ICD-10-CM | POA: Diagnosis not present

## 2021-02-07 DIAGNOSIS — R300 Dysuria: Secondary | ICD-10-CM | POA: Diagnosis not present

## 2021-02-07 DIAGNOSIS — Z6836 Body mass index (BMI) 36.0-36.9, adult: Secondary | ICD-10-CM | POA: Diagnosis not present

## 2021-02-07 DIAGNOSIS — M25572 Pain in left ankle and joints of left foot: Secondary | ICD-10-CM | POA: Diagnosis not present

## 2021-02-07 DIAGNOSIS — M25562 Pain in left knee: Secondary | ICD-10-CM | POA: Diagnosis not present

## 2021-02-09 DIAGNOSIS — M25462 Effusion, left knee: Secondary | ICD-10-CM | POA: Diagnosis not present

## 2021-02-09 DIAGNOSIS — E1122 Type 2 diabetes mellitus with diabetic chronic kidney disease: Secondary | ICD-10-CM | POA: Diagnosis not present

## 2021-02-09 DIAGNOSIS — N189 Chronic kidney disease, unspecified: Secondary | ICD-10-CM | POA: Diagnosis not present

## 2021-02-09 DIAGNOSIS — S93402D Sprain of unspecified ligament of left ankle, subsequent encounter: Secondary | ICD-10-CM | POA: Diagnosis not present

## 2021-02-09 DIAGNOSIS — I129 Hypertensive chronic kidney disease with stage 1 through stage 4 chronic kidney disease, or unspecified chronic kidney disease: Secondary | ICD-10-CM | POA: Diagnosis not present

## 2021-02-09 DIAGNOSIS — C19 Malignant neoplasm of rectosigmoid junction: Secondary | ICD-10-CM | POA: Diagnosis not present

## 2021-02-15 DIAGNOSIS — Z6836 Body mass index (BMI) 36.0-36.9, adult: Secondary | ICD-10-CM | POA: Diagnosis not present

## 2021-02-15 DIAGNOSIS — S93432A Sprain of tibiofibular ligament of left ankle, initial encounter: Secondary | ICD-10-CM | POA: Diagnosis not present

## 2021-02-15 DIAGNOSIS — M25572 Pain in left ankle and joints of left foot: Secondary | ICD-10-CM | POA: Diagnosis not present

## 2021-02-17 DIAGNOSIS — E1142 Type 2 diabetes mellitus with diabetic polyneuropathy: Secondary | ICD-10-CM | POA: Diagnosis not present

## 2021-02-17 DIAGNOSIS — R059 Cough, unspecified: Secondary | ICD-10-CM | POA: Diagnosis not present

## 2021-02-17 DIAGNOSIS — R531 Weakness: Secondary | ICD-10-CM | POA: Diagnosis not present

## 2021-02-17 DIAGNOSIS — R4182 Altered mental status, unspecified: Secondary | ICD-10-CM | POA: Diagnosis not present

## 2021-02-17 DIAGNOSIS — E1165 Type 2 diabetes mellitus with hyperglycemia: Secondary | ICD-10-CM | POA: Diagnosis not present

## 2021-02-17 DIAGNOSIS — D473 Essential (hemorrhagic) thrombocythemia: Secondary | ICD-10-CM | POA: Diagnosis not present

## 2021-02-17 DIAGNOSIS — R262 Difficulty in walking, not elsewhere classified: Secondary | ICD-10-CM | POA: Diagnosis not present

## 2021-02-17 DIAGNOSIS — E1141 Type 2 diabetes mellitus with diabetic mononeuropathy: Secondary | ICD-10-CM | POA: Diagnosis not present

## 2021-02-17 DIAGNOSIS — D649 Anemia, unspecified: Secondary | ICD-10-CM | POA: Diagnosis not present

## 2021-02-17 DIAGNOSIS — R29898 Other symptoms and signs involving the musculoskeletal system: Secondary | ICD-10-CM | POA: Diagnosis not present

## 2021-02-17 DIAGNOSIS — I517 Cardiomegaly: Secondary | ICD-10-CM | POA: Diagnosis not present

## 2021-02-17 DIAGNOSIS — W19XXXA Unspecified fall, initial encounter: Secondary | ICD-10-CM | POA: Diagnosis not present

## 2021-02-18 DIAGNOSIS — D473 Essential (hemorrhagic) thrombocythemia: Secondary | ICD-10-CM | POA: Diagnosis not present

## 2021-02-18 DIAGNOSIS — R29898 Other symptoms and signs involving the musculoskeletal system: Secondary | ICD-10-CM | POA: Diagnosis not present

## 2021-02-19 DIAGNOSIS — Z794 Long term (current) use of insulin: Secondary | ICD-10-CM | POA: Diagnosis not present

## 2021-02-19 DIAGNOSIS — M25562 Pain in left knee: Secondary | ICD-10-CM | POA: Diagnosis not present

## 2021-02-19 DIAGNOSIS — Z7901 Long term (current) use of anticoagulants: Secondary | ICD-10-CM | POA: Diagnosis not present

## 2021-02-19 DIAGNOSIS — S8255XA Nondisplaced fracture of medial malleolus of left tibia, initial encounter for closed fracture: Secondary | ICD-10-CM | POA: Diagnosis not present

## 2021-02-19 DIAGNOSIS — M1990 Unspecified osteoarthritis, unspecified site: Secondary | ICD-10-CM | POA: Diagnosis present

## 2021-02-19 DIAGNOSIS — Z6834 Body mass index (BMI) 34.0-34.9, adult: Secondary | ICD-10-CM | POA: Diagnosis not present

## 2021-02-19 DIAGNOSIS — R700 Elevated erythrocyte sedimentation rate: Secondary | ICD-10-CM | POA: Diagnosis not present

## 2021-02-19 DIAGNOSIS — Z20822 Contact with and (suspected) exposure to covid-19: Secondary | ICD-10-CM | POA: Diagnosis present

## 2021-02-19 DIAGNOSIS — S8265XA Nondisplaced fracture of lateral malleolus of left fibula, initial encounter for closed fracture: Secondary | ICD-10-CM | POA: Diagnosis present

## 2021-02-19 DIAGNOSIS — D649 Anemia, unspecified: Secondary | ICD-10-CM | POA: Diagnosis present

## 2021-02-19 DIAGNOSIS — R600 Localized edema: Secondary | ICD-10-CM | POA: Diagnosis not present

## 2021-02-19 DIAGNOSIS — R202 Paresthesia of skin: Secondary | ICD-10-CM | POA: Diagnosis not present

## 2021-02-19 DIAGNOSIS — S93492A Sprain of other ligament of left ankle, initial encounter: Secondary | ICD-10-CM | POA: Diagnosis not present

## 2021-02-19 DIAGNOSIS — R29898 Other symptoms and signs involving the musculoskeletal system: Secondary | ICD-10-CM | POA: Diagnosis not present

## 2021-02-19 DIAGNOSIS — E1165 Type 2 diabetes mellitus with hyperglycemia: Secondary | ICD-10-CM | POA: Diagnosis present

## 2021-02-19 DIAGNOSIS — Z7984 Long term (current) use of oral hypoglycemic drugs: Secondary | ICD-10-CM | POA: Diagnosis not present

## 2021-02-19 DIAGNOSIS — E782 Mixed hyperlipidemia: Secondary | ICD-10-CM | POA: Diagnosis present

## 2021-02-19 DIAGNOSIS — E1141 Type 2 diabetes mellitus with diabetic mononeuropathy: Secondary | ICD-10-CM | POA: Diagnosis present

## 2021-02-19 DIAGNOSIS — I700 Atherosclerosis of aorta: Secondary | ICD-10-CM | POA: Diagnosis present

## 2021-02-19 DIAGNOSIS — S93439A Sprain of tibiofibular ligament of unspecified ankle, initial encounter: Secondary | ICD-10-CM | POA: Diagnosis present

## 2021-02-19 DIAGNOSIS — Z91041 Radiographic dye allergy status: Secondary | ICD-10-CM | POA: Diagnosis not present

## 2021-02-19 DIAGNOSIS — S8292XD Unspecified fracture of left lower leg, subsequent encounter for closed fracture with routine healing: Secondary | ICD-10-CM | POA: Diagnosis not present

## 2021-02-19 DIAGNOSIS — M48061 Spinal stenosis, lumbar region without neurogenic claudication: Secondary | ICD-10-CM | POA: Diagnosis not present

## 2021-02-19 DIAGNOSIS — I10 Essential (primary) hypertension: Secondary | ICD-10-CM | POA: Diagnosis present

## 2021-02-19 DIAGNOSIS — R2689 Other abnormalities of gait and mobility: Secondary | ICD-10-CM | POA: Diagnosis not present

## 2021-02-19 DIAGNOSIS — I714 Abdominal aortic aneurysm, without rupture: Secondary | ICD-10-CM | POA: Diagnosis present

## 2021-02-19 DIAGNOSIS — Z85038 Personal history of other malignant neoplasm of large intestine: Secondary | ICD-10-CM | POA: Diagnosis not present

## 2021-02-19 DIAGNOSIS — S93412A Sprain of calcaneofibular ligament of left ankle, initial encounter: Secondary | ICD-10-CM | POA: Diagnosis not present

## 2021-02-19 DIAGNOSIS — F329 Major depressive disorder, single episode, unspecified: Secondary | ICD-10-CM | POA: Diagnosis not present

## 2021-02-19 DIAGNOSIS — D473 Essential (hemorrhagic) thrombocythemia: Secondary | ICD-10-CM | POA: Diagnosis present

## 2021-02-19 DIAGNOSIS — E1142 Type 2 diabetes mellitus with diabetic polyneuropathy: Secondary | ICD-10-CM | POA: Diagnosis present

## 2021-02-19 DIAGNOSIS — F411 Generalized anxiety disorder: Secondary | ICD-10-CM | POA: Diagnosis present

## 2021-02-19 DIAGNOSIS — R531 Weakness: Secondary | ICD-10-CM | POA: Diagnosis present

## 2021-02-19 DIAGNOSIS — M6281 Muscle weakness (generalized): Secondary | ICD-10-CM | POA: Diagnosis not present

## 2021-02-19 DIAGNOSIS — R634 Abnormal weight loss: Secondary | ICD-10-CM | POA: Diagnosis not present

## 2021-02-19 DIAGNOSIS — M10079 Idiopathic gout, unspecified ankle and foot: Secondary | ICD-10-CM | POA: Diagnosis present

## 2021-02-19 DIAGNOSIS — Z9181 History of falling: Secondary | ICD-10-CM | POA: Diagnosis not present

## 2021-02-19 DIAGNOSIS — S8251XA Displaced fracture of medial malleolus of right tibia, initial encounter for closed fracture: Secondary | ICD-10-CM | POA: Diagnosis not present

## 2021-02-19 DIAGNOSIS — M1712 Unilateral primary osteoarthritis, left knee: Secondary | ICD-10-CM | POA: Diagnosis present

## 2021-02-19 DIAGNOSIS — S9002XA Contusion of left ankle, initial encounter: Secondary | ICD-10-CM | POA: Diagnosis not present

## 2021-02-19 DIAGNOSIS — Z882 Allergy status to sulfonamides status: Secondary | ICD-10-CM | POA: Diagnosis not present

## 2021-02-22 DIAGNOSIS — F329 Major depressive disorder, single episode, unspecified: Secondary | ICD-10-CM | POA: Diagnosis not present

## 2021-02-22 DIAGNOSIS — M6281 Muscle weakness (generalized): Secondary | ICD-10-CM | POA: Diagnosis not present

## 2021-02-22 DIAGNOSIS — F411 Generalized anxiety disorder: Secondary | ICD-10-CM | POA: Diagnosis not present

## 2021-02-22 DIAGNOSIS — Z6827 Body mass index (BMI) 27.0-27.9, adult: Secondary | ICD-10-CM | POA: Diagnosis not present

## 2021-02-22 DIAGNOSIS — Z9181 History of falling: Secondary | ICD-10-CM | POA: Diagnosis not present

## 2021-02-22 DIAGNOSIS — S8292XD Unspecified fracture of left lower leg, subsequent encounter for closed fracture with routine healing: Secondary | ICD-10-CM | POA: Diagnosis not present

## 2021-02-22 DIAGNOSIS — M25562 Pain in left knee: Secondary | ICD-10-CM | POA: Diagnosis not present

## 2021-02-22 DIAGNOSIS — I10 Essential (primary) hypertension: Secondary | ICD-10-CM | POA: Diagnosis not present

## 2021-02-22 DIAGNOSIS — R2689 Other abnormalities of gait and mobility: Secondary | ICD-10-CM | POA: Diagnosis not present

## 2021-02-22 DIAGNOSIS — E1142 Type 2 diabetes mellitus with diabetic polyneuropathy: Secondary | ICD-10-CM | POA: Diagnosis not present

## 2021-02-22 DIAGNOSIS — M25572 Pain in left ankle and joints of left foot: Secondary | ICD-10-CM | POA: Diagnosis not present

## 2021-02-22 DIAGNOSIS — E1165 Type 2 diabetes mellitus with hyperglycemia: Secondary | ICD-10-CM | POA: Diagnosis not present

## 2021-02-22 DIAGNOSIS — D473 Essential (hemorrhagic) thrombocythemia: Secondary | ICD-10-CM | POA: Diagnosis not present

## 2021-02-22 DIAGNOSIS — S82892A Other fracture of left lower leg, initial encounter for closed fracture: Secondary | ICD-10-CM | POA: Diagnosis not present

## 2021-02-22 DIAGNOSIS — R29898 Other symptoms and signs involving the musculoskeletal system: Secondary | ICD-10-CM | POA: Diagnosis not present

## 2021-02-24 DIAGNOSIS — R29898 Other symptoms and signs involving the musculoskeletal system: Secondary | ICD-10-CM | POA: Diagnosis not present

## 2021-02-24 DIAGNOSIS — D473 Essential (hemorrhagic) thrombocythemia: Secondary | ICD-10-CM | POA: Diagnosis not present

## 2021-03-01 DIAGNOSIS — S82892A Other fracture of left lower leg, initial encounter for closed fracture: Secondary | ICD-10-CM | POA: Diagnosis not present

## 2021-03-01 DIAGNOSIS — M25562 Pain in left knee: Secondary | ICD-10-CM | POA: Diagnosis not present

## 2021-03-04 DIAGNOSIS — D473 Essential (hemorrhagic) thrombocythemia: Secondary | ICD-10-CM | POA: Diagnosis not present

## 2021-03-10 DIAGNOSIS — D473 Essential (hemorrhagic) thrombocythemia: Secondary | ICD-10-CM | POA: Diagnosis not present

## 2021-03-10 DIAGNOSIS — R29898 Other symptoms and signs involving the musculoskeletal system: Secondary | ICD-10-CM | POA: Diagnosis not present

## 2021-03-13 ENCOUNTER — Inpatient Hospital Stay (HOSPITAL_COMMUNITY): Payer: Medicare Other

## 2021-03-13 ENCOUNTER — Ambulatory Visit (HOSPITAL_COMMUNITY): Payer: Medicare Other | Admitting: Oncology

## 2021-03-13 ENCOUNTER — Telehealth (HOSPITAL_COMMUNITY): Payer: Self-pay | Admitting: Surgery

## 2021-03-13 ENCOUNTER — Other Ambulatory Visit: Payer: Self-pay | Admitting: *Deleted

## 2021-03-13 NOTE — Telephone Encounter (Signed)
Pt had left a voicemail asking Dr. Delton Coombes to review his labs from [facility], especially his platelets.  I notified Dr. Delton Coombes, who stated that he would look over the labs and call the patient back.  I attempted to call the pt to let him know.

## 2021-03-13 NOTE — Patient Outreach (Signed)
Member screened for potential [facility] Care Management needs.  Mr. Champoux is receiving skilled therapy at [facility].   Update received from SNF SW indicating member's goal is to return home with his supportive wife.   Will plan outreach as appropriate to discuss [facility] services.    Marthenia Rolling, MSN, RN,BSN [place] Acute Care Coordinator [phone] [facility]) [phone]  (Toll free office)

## 2021-03-17 ENCOUNTER — Encounter (HOSPITAL_COMMUNITY): Payer: Self-pay

## 2021-03-19 DIAGNOSIS — M25572 Pain in left ankle and joints of left foot: Secondary | ICD-10-CM | POA: Diagnosis not present

## 2021-03-19 DIAGNOSIS — Z6827 Body mass index (BMI) 27.0-27.9, adult: Secondary | ICD-10-CM | POA: Diagnosis not present

## 2021-03-19 DIAGNOSIS — M25562 Pain in left knee: Secondary | ICD-10-CM | POA: Diagnosis not present

## 2021-03-21 ENCOUNTER — Other Ambulatory Visit: Payer: Self-pay | Admitting: *Deleted

## 2021-03-21 NOTE — Patient Outreach (Signed)
[place] Coordinator follow up.   Communication sent to [facility] SNF SW to request update on transition. Member from home with spouse/  Will continue to follow for potential [facility] Care Management services while member resides in SNF. Will plan outreach as appropriate.     Marthenia Rolling, MSN, RN,BSN [place] Acute Care Coordinator [phone] [facility]) [phone]  (Toll free office)

## 2021-03-22 ENCOUNTER — Other Ambulatory Visit: Payer: Self-pay | Admitting: *Deleted

## 2021-03-22 NOTE — Patient Outreach (Signed)
[place] Coordinator follow up. Member screened for potential [facility] Care Management needs.  Update received from [facility] indicating no transition date set yet. Member has very supportive spouse who visits SNF daily. Plan is to return home.  Will continue to follow for potential [facility] Care Management needs while member resides in SNF.   Marthenia Rolling, MSN, RN,BSN [place] Acute Care Coordinator [phone] [facility]) [phone]  (Toll free office)

## 2021-03-25 DIAGNOSIS — D473 Essential (hemorrhagic) thrombocythemia: Secondary | ICD-10-CM | POA: Diagnosis not present

## 2021-03-25 DIAGNOSIS — R29898 Other symptoms and signs involving the musculoskeletal system: Secondary | ICD-10-CM | POA: Diagnosis not present

## 2021-03-29 ENCOUNTER — Other Ambulatory Visit: Payer: Self-pay | Admitting: *Deleted

## 2021-03-29 NOTE — Patient Outreach (Signed)
[facility] Post- Acute Care Coordinator follow up.   Mr. Kinslow remains in [place]. Update received from SNF SW reporting transition plan remains to return home with spouse.   Will continue to follow transition plans and for potential [facility] Care Management needs.    Marthenia Rolling, MSN, RN,BSN [place] Acute Care Coordinator [phone] [facility]) [phone]  (Toll free office)

## 2021-04-07 DIAGNOSIS — R29898 Other symptoms and signs involving the musculoskeletal system: Secondary | ICD-10-CM | POA: Diagnosis not present

## 2021-04-07 DIAGNOSIS — D473 Essential (hemorrhagic) thrombocythemia: Secondary | ICD-10-CM | POA: Diagnosis not present

## 2021-04-09 ENCOUNTER — Other Ambulatory Visit: Payer: Self-pay | Admitting: *Deleted

## 2021-04-09 NOTE — Patient Outreach (Signed)
[facility] Post - Acute Care Coordinator follow up. Member screened for potential [facility] Care Management needs.  Per [place] (Patient Justin Berger) member is residing in [facility]. Communication sent to SNF SW to inquire about transition plans.   Will continue to follow while member resides in SNF.    Marthenia Rolling, MSN, RN,BSN [place] Acute Care Coordinator [phone] [facility]) [phone]  (Toll free office)

## 2021-04-11 ENCOUNTER — Other Ambulatory Visit: Payer: Self-pay | Admitting: *Deleted

## 2021-04-11 NOTE — Patient Outreach (Signed)
[facility] Post- Acute Care Coordinator follow up. Member screened for potential [facility] Care Management needs.  Update received from [facility] indicating member will return home on Friday, 04/13/21 with spouse. States member is doing great with therapy and will have outpatient PT/OT instead of home health.   No identifiable [facility] Care Management needs at this time.    Marthenia Rolling, MSN, RN,BSN [place] Acute Care Coordinator [phone] [facility]) [phone]  (Toll free office)

## 2021-04-16 DIAGNOSIS — S82892A Other fracture of left lower leg, initial encounter for closed fracture: Secondary | ICD-10-CM | POA: Diagnosis not present

## 2021-04-16 DIAGNOSIS — M25562 Pain in left knee: Secondary | ICD-10-CM | POA: Diagnosis not present

## 2021-04-18 DIAGNOSIS — H26492 Other secondary cataract, left eye: Secondary | ICD-10-CM | POA: Diagnosis not present

## 2021-04-18 DIAGNOSIS — E119 Type 2 diabetes mellitus without complications: Secondary | ICD-10-CM | POA: Diagnosis not present

## 2021-04-18 DIAGNOSIS — H34832 Tributary (branch) retinal vein occlusion, left eye, with macular edema: Secondary | ICD-10-CM | POA: Diagnosis not present

## 2021-04-18 DIAGNOSIS — Z961 Presence of intraocular lens: Secondary | ICD-10-CM | POA: Diagnosis not present

## 2021-04-18 DIAGNOSIS — H40013 Open angle with borderline findings, low risk, bilateral: Secondary | ICD-10-CM | POA: Diagnosis not present

## 2021-04-20 DIAGNOSIS — R262 Difficulty in walking, not elsewhere classified: Secondary | ICD-10-CM | POA: Diagnosis not present

## 2021-04-20 DIAGNOSIS — S8292XD Unspecified fracture of left lower leg, subsequent encounter for closed fracture with routine healing: Secondary | ICD-10-CM | POA: Diagnosis not present

## 2021-04-20 DIAGNOSIS — R29898 Other symptoms and signs involving the musculoskeletal system: Secondary | ICD-10-CM | POA: Diagnosis not present

## 2021-04-20 DIAGNOSIS — R2681 Unsteadiness on feet: Secondary | ICD-10-CM | POA: Diagnosis not present

## 2021-04-20 NOTE — Progress Notes (Signed)
Justin Berger, Justin Berger [id number]   CLINIC:  Medical Oncology/Hematology  PCP:  Caryl Bis, MD [place] [phone]   REASON FOR VISIT:  Follow-up for essential thrombocytosis  PRIOR THERAPY: None  CURRENT THERAPY: Hydrea 2 tablets daily.  Aspirin 81 mg daily.  INTERVAL HISTORY:  Justin Berger 70 y.o. male returns for routine follow-up of his essential thrombocytosis.  He was last seen by Dr. Delton Coombes on 11/29/2020.  Since his last visit, he sustained a fall on 02/02/2021 with nondisplaced left ankle fracture, multiple ligamental tears and sprains.  He reports that not long after the fall, he had an episode of bilateral extremity weakness, possibly secondary to a back injury at the time of the fall.  He spent 2 months at an SNF doing inpatient rehab, and has slowly learned to walk again.  He continues to follow with orthopedics, and is currently attending outpatient PT twice per week.  Patient had previously complained of severe fatigue, but this improved after his dose of Hydrea was decreased to 2 tablets daily.  He continues to have fatigue following his ankle fracture and rehab stay, but states that his energy is slowly improving (currently at 60%).  While at rehab, patient lost about 50 pounds, but has regained about 10 pounds after being discharged home. He denies fever, chills, night sweats, malaise.  No new masses or lymphadenopathy.  Labs obtained at [facility] on 04/08/2021 show that the patient has had worsening anemia since the time of his last visit.  Hgb in 11/29/2020 was 12.0.  However, labs on 04/08/2021 show macrocytic anemia with Hgb 8.3, MCV 107.1 (taking Hydrea), RBCs 2.55.  Platelets are increased at 756 (458 on 11/29/2020).  Normal WBC and differential.  Smear review showed presence of stomatocytes and moderate ovalocytes.  CMP showed creatinine 1.05, normal bilirubin 0.6.  Labs obtained today (04/23/2021) show Hgb  8.9 (MCV 112.8) and platelets 715.  Patient has borderline blood pressure today (99/68), possibly due to addition of Valsartan while he was at [facility].  Reports mild lightheadedness with standing, but denies syncope, headache, vision changes.   Patient denies any signs or symptoms of bleeding - no epistaxis, hematemesis, hemoptysis, melena, or hemnatochezia.  He has a history of left leg DVT, but denies recent thrombotic events. No erythromelalgias, aquagenic pruritis, or changes in finger/toe coloration.  No new onset or worsening peripheral edema. 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  No priapism.   He is tolerating Hydrea well.  CBC shows expected macrocytosis.  Patient denies cutaneous ulcers and nonhealing skin wounds.  No complaints of mucous cytosis.  Denies gastrointestinal symptoms such as gastritis, nausea, vomiting, diarrhea.  Energy levels have been improved on decreased dose of Hydrea. He has 60% energy and 100% appetite.     REVIEW OF SYSTEMS:  Review of Systems  Constitutional: Positive for fatigue and unexpected weight change. Negative for appetite change, chills, diaphoresis and fever.  HENT:   Negative for lump/mass and nosebleeds.   Eyes: Negative for eye problems.  Respiratory: Negative for cough, hemoptysis and shortness of breath.   Cardiovascular: Negative for chest pain, leg swelling and palpitations.  Gastrointestinal: Negative for abdominal pain, blood in stool, constipation, diarrhea, nausea and vomiting.  Genitourinary: Negative for hematuria.   Musculoskeletal: Positive for arthralgias.  Skin: Negative.   Neurological: Positive for numbness (bilateral feet). Negative for dizziness, headaches and light-headedness.  Hematological: Does not bruise/bleed easily.  PAST MEDICAL/SURGICAL HISTORY:  Past Medical History:  Diagnosis Date  . Diabetes ([place])   . Hypertension    No past surgical history on file.   SOCIAL HISTORY:  Social  History   Socioeconomic History  . Marital status: Married    Spouse name: Not on file  . Number of children: Not on file  . Years of education: Not on file  . Highest education level: Not on file  Occupational History  . Not on file  Tobacco Use  . Smoking status: Former Smoker    Packs/day: 0.50    Years: 3.00    Pack years: 1.50    Types: Cigarettes    Quit date: 04/08/1978    Years since quitting: 43.0  . Smokeless tobacco: Never Used  Vaping Use  . Vaping Use: Never used  Substance and Sexual Activity  . Alcohol use: Never  . Drug use: Never  . Sexual activity: Not on file  Other Topics Concern  . Not on file  Social History Narrative  . Not on file   Social Determinants of Health   Financial Resource Strain: Low Risk   . Difficulty of Paying Living Expenses: Not hard at all  Food Insecurity: No Food Insecurity  . Worried About Charity fundraiser in the Last Year: Never true  . Ran Out of Food in the Last Year: Never true  Transportation Needs: No Transportation Needs  . Lack of Transportation (Medical): No  . Lack of Transportation (Non-Medical): No  Physical Activity: Inactive  . Days of Exercise per Week: 0 days  . Minutes of Exercise per Session: 0 min  Stress: No Stress Concern Present  . Feeling of Stress : Not at all  Social Connections: Moderately Integrated  . Frequency of Communication with Friends and Family: More than three times a week  . Frequency of Social Gatherings with Friends and Family: Three times a week  . Attends Religious Services: 1 to 4 times per year  . Active Member of Clubs or Organizations: No  . Attends Archivist Meetings: Never  . Marital Status: Married  Human resources officer Violence: Not At Risk  . Fear of Current or Ex-Partner: No  . Emotionally Abused: No  . Physically Abused: No  . Sexually Abused: No    FAMILY HISTORY:  Family History  Problem Relation Age of Onset  . Dementia Mother   . Cancer Paternal  Uncle        lung    CURRENT MEDICATIONS:  Outpatient Encounter Medications as of 04/23/2021  Medication Sig  . ACCU-CHEK AVIVA PLUS test strip TEST BLOOD SUGAR D  . allopurinol (ZYLOPRIM) 300 MG tablet Take 300 mg by mouth daily.  [name] aspirin 81 MG EC tablet Take by mouth.  [name] atorvastatin (LIPITOR) 10 MG tablet Take 10 mg by mouth once a week.  . clonazePAM (KLONOPIN) 0.5 MG tablet Take 0.5 mg by mouth 2 (two) times daily as needed.   . cyclobenzaprine (FLEXERIL) 10 MG tablet Take 10 mg by mouth as needed.   . gabapentin (NEURONTIN) 300 MG capsule Take 300 mg by mouth at bedtime.  [name] glipiZIDE (GLUCOTROL XL) 10 MG 24 hr tablet Take 10 mg by mouth daily.  . hydroxyurea (HYDREA) 500 MG capsule Take 2 capsules (1,000 mg total) by mouth daily. Take 3 tablets Monday through Friday. Take 2 tablets on Saturday and Sunday  . metFORMIN (GLUCOPHAGE-XR) 500 MG 24 hr tablet Take 500 mg by mouth daily.   [name]  metoprolol succinate (TOPROL-XL) 50 MG 24 hr tablet Take 50 mg by mouth daily.   . promethazine (PHENERGAN) 12.5 MG tablet Take 12.5 mg by mouth as needed.   . sildenafil (VIAGRA) 50 MG tablet Take 1 tablet (50 mg total) by mouth daily as needed for erectile dysfunction.   No facility-administered encounter medications on file as of 04/23/2021.    ALLERGIES:  Allergies  Allergen Reactions  . Ivp Dye  [Iodinated Diagnostic Agents]      PHYSICAL EXAM:  ECOG PERFORMANCE STATUS: 2 - Symptomatic, <50% confined to bed  There were no vitals filed for this visit. There were no vitals filed for this visit. Physical Exam Constitutional:      Appearance: Normal appearance. He is obese.     Comments: Weak-appearing  HENT:     Head: Normocephalic and atraumatic.     Mouth/Throat:     Mouth: Mucous membranes are moist.  Eyes:     Extraocular Movements: Extraocular movements intact.     Pupils: Pupils are equal, round, and reactive to light.  Cardiovascular:     Rate and Rhythm: Normal rate and  regular rhythm.     Pulses: Normal pulses.     Heart sounds: Normal heart sounds.  Pulmonary:     Effort: Pulmonary effort is normal.     Breath sounds: Normal breath sounds.  Abdominal:     General: Bowel sounds are normal.     Palpations: Abdomen is soft.     Tenderness: There is no abdominal tenderness.  Musculoskeletal:        General: No swelling.     Right lower leg: No edema.     Left lower leg: Edema (trace left ankle edema) present.  Lymphadenopathy:     Cervical: No cervical adenopathy.  Skin:    General: Skin is warm and dry.     Coloration: Skin is pale.  Neurological:     General: No focal deficit present.     Mental Status: He is alert and oriented to person, place, and time.  Psychiatric:        Mood and Affect: Mood normal.        Behavior: Behavior normal.      LABORATORY DATA:  I have reviewed the labs as listed.  CBC    Component Value Date/Time   WBC 5.0 11/29/2020 1319   RBC 3.49 (L) 11/29/2020 1319   HGB 12.0 (L) 11/29/2020 1319   HCT 37.2 (L) 11/29/2020 1319   PLT 458 (H) 11/29/2020 1319   MCV 106.6 (H) 11/29/2020 1319   MCH 34.4 (H) 11/29/2020 1319   MCHC 32.3 11/29/2020 1319   RDW 16.6 (H) 11/29/2020 1319   LYMPHSABS 1.1 11/29/2020 1319   MONOABS 0.5 11/29/2020 1319   EOSABS 0.0 11/29/2020 1319   BASOSABS 0.1 11/29/2020 1319   CMP Latest Ref Rng & Units 11/29/2020 09/19/2020 06/21/2020  Glucose 70 - 99 mg/dL 220(H) 189(H) 127(H)  BUN 8 - 23 mg/dL 17 12 15   Creatinine 0.61 - 1.24 mg/dL 1.22 1.19 1.13  Sodium 135 - 145 mmol/L 134(L) 139 137  Potassium 3.5 - 5.1 mmol/L 4.1 4.1 4.0  Chloride 98 - 111 mmol/L 103 104 106  CO2 22 - 32 mmol/L 21(L) 25 21(L)  Calcium 8.9 - 10.3 mg/dL 9.6 9.3 9.6  Total Protein 6.5 - 8.1 g/dL 6.8 6.6 7.0  Total Bilirubin 0.3 - 1.2 mg/dL 0.9 0.9 0.7  Alkaline Phos 38 - 126 U/L 38 32(L) 39  AST 15 -  41 U/L 21 17 18   ALT 0 - 44 U/L 34 26 31    DIAGNOSTIC IMAGING:  I have independently reviewed the relevant  imaging and discussed with the patient.  ASSESSMENT: 1. Essential thrombocytosis, CALR+: -High risk disease with history of thrombosis (left leg DVT) and age more than 88. -No vasomotor symptoms. No aquagenic pruritus. -Hydroxyurea started in December 2018. He is on aspirin 81 mg daily. -He is currently taking hydroxyurea 2 tablets daily (was previously taking 3 tablets M-F, but this was decreased while he was hospitalized due to anemia and fatigue) - Most recent labs 04/23/2021: Platelets 715 (were 458 on 11/29/2020) -He is tolerating Hydrea well without side effects  2.  Macrocytic anemia -Labs obtained at [facility] on 04/08/2021 show that the patient has had worsening anemia since the time of his last visit.  Hgb in 11/29/2020 was 12.0.  However, labs on 04/08/2021 show macrocytic anemia with Hgb 8.3, MCV 107.1 (taking Hydrea), RBCs 2.55.  Platelets are increased at 756 (458 on 11/29/2020).  Normal WBC and differential.  Smear review showed presence of stomatocytes and moderate ovalocytes.  CMP showed creatinine 1.05, normal bilirubin 0.6. - Macrocytosis due to hydroxyurea - Symptomatic with fatigue - Denies melena or bright red blood per rectum  3. Right oral mucosal lesion: -He was seen by Dr. Benjamine Mola. Lesion was felt to be hemangioma-like 1 cm stable lesion with no surface ulceration.  4. Erectile dysfunction: -Patient was started on Viagra 50 mg due to decreased erections, instructed not to take it together with his blood pressure medication. - Denies priapism in the setting of essential thrombocytosis   PLAN:  1. Essential thrombocytosis, CALR+: -We reviewed labs from 04/23/2021.  Platelet count is 715 with normocytic anemia (see below) and normal white count. -Patient currently taking 2 tablets of Hydrea daily -Physical examination shows no palpable adenopathy.  No splenomegaly.  - Consider increased dose of Hydrea, pending work-up for anemia   2.  Macrocytic  anemia - Macrocytosis due to hydroxyurea -Anemia may be due to myelosuppressive effects of Hydrea, but other causes to be investigated and ruled out prior to changing dose of Hydrea - Symptomatic with fatigue - Denies melena or bright red blood per rectum - Check stool occult blood, haptoglobin, CMP, LDH - Check B12, folate, methylmalonic acid, iron/TIBC, ferritin - Repeat CBC and peripheral smear - Reticulocytes and erythropoietin - Check SPEP/IFE/free light chains  3.  Fatigue - Patient complained of severe fatigue for the past several months  - Possibly secondary to anemia as above, or due to increased dose of Hydrea - Dose of Hydrea was decreased, and he reports slightly increased energy levels after change in Hydrea dose - TSH 1.475 and testosterone 301 checked on 11/29/2020, results within normal limits  4.  Low blood pressure - Marginal blood pressure at today's visit 99/68 - Was started on valsartan at nursing home - Patient instructed to stop taking valsartan follow-up with his PCP as soon as possible for further instructions   PLAN SUMMARY & DISPOSITION: -Labs today - RTC 2 weeks to discuss results and possibly adjust dose of Hydrea  All questions were answered. The patient knows to call the clinic with any problems, questions or concerns.  Medical decision making: Moderate (1 chronic problem with exacerbation, 1 new problem under work-up, review of prior testing, review of external notes, ordering new tests)  Time spent on visit: I spent 30 minutes counseling the patient face to face. The total time spent  in the appointment was 40 minutes and more than 50% was on counseling.  The above clinical data and plan of treatment was discussed with Dr. Delton Coombes, supervising physician, who agrees.  Harriett Rush, PA-C  04/20/21 4:45 PM

## 2021-04-23 ENCOUNTER — Inpatient Hospital Stay (HOSPITAL_COMMUNITY): Payer: Medicare Other | Attending: Physician Assistant | Admitting: Physician Assistant

## 2021-04-23 ENCOUNTER — Inpatient Hospital Stay (HOSPITAL_COMMUNITY): Payer: Medicare Other

## 2021-04-23 ENCOUNTER — Other Ambulatory Visit (HOSPITAL_COMMUNITY): Payer: Self-pay

## 2021-04-23 ENCOUNTER — Other Ambulatory Visit: Payer: Self-pay

## 2021-04-23 ENCOUNTER — Encounter (HOSPITAL_COMMUNITY): Payer: Self-pay | Admitting: Physician Assistant

## 2021-04-23 VITALS — BP 99/68 | HR 76 | Temp 97.8°F | Resp 16 | Wt 205.7 lb

## 2021-04-23 DIAGNOSIS — R29898 Other symptoms and signs involving the musculoskeletal system: Secondary | ICD-10-CM | POA: Diagnosis not present

## 2021-04-23 DIAGNOSIS — Z79899 Other long term (current) drug therapy: Secondary | ICD-10-CM | POA: Diagnosis not present

## 2021-04-23 DIAGNOSIS — I959 Hypotension, unspecified: Secondary | ICD-10-CM | POA: Insufficient documentation

## 2021-04-23 DIAGNOSIS — N529 Male erectile dysfunction, unspecified: Secondary | ICD-10-CM | POA: Diagnosis not present

## 2021-04-23 DIAGNOSIS — D473 Essential (hemorrhagic) thrombocythemia: Secondary | ICD-10-CM

## 2021-04-23 DIAGNOSIS — S8292XD Unspecified fracture of left lower leg, subsequent encounter for closed fracture with routine healing: Secondary | ICD-10-CM | POA: Diagnosis not present

## 2021-04-23 DIAGNOSIS — R5383 Other fatigue: Secondary | ICD-10-CM | POA: Insufficient documentation

## 2021-04-23 DIAGNOSIS — G620 Drug-induced polyneuropathy: Secondary | ICD-10-CM | POA: Insufficient documentation

## 2021-04-23 DIAGNOSIS — E538 Deficiency of other specified B group vitamins: Secondary | ICD-10-CM

## 2021-04-23 DIAGNOSIS — K1370 Unspecified lesions of oral mucosa: Secondary | ICD-10-CM | POA: Diagnosis not present

## 2021-04-23 DIAGNOSIS — D649 Anemia, unspecified: Secondary | ICD-10-CM | POA: Diagnosis not present

## 2021-04-23 DIAGNOSIS — Z86718 Personal history of other venous thrombosis and embolism: Secondary | ICD-10-CM | POA: Diagnosis not present

## 2021-04-23 DIAGNOSIS — Z87891 Personal history of nicotine dependence: Secondary | ICD-10-CM | POA: Diagnosis not present

## 2021-04-23 DIAGNOSIS — D539 Nutritional anemia, unspecified: Secondary | ICD-10-CM | POA: Diagnosis not present

## 2021-04-23 DIAGNOSIS — R2681 Unsteadiness on feet: Secondary | ICD-10-CM | POA: Diagnosis not present

## 2021-04-23 DIAGNOSIS — R262 Difficulty in walking, not elsewhere classified: Secondary | ICD-10-CM | POA: Diagnosis not present

## 2021-04-23 LAB — CBC WITH DIFFERENTIAL/PLATELET
Basophils Absolute: 0 10*3/uL (ref 0.0–0.1)
Basophils Relative: 0 %
Eosinophils Absolute: 0 10*3/uL (ref 0.0–0.5)
Eosinophils Relative: 0 %
HCT: 30 % — ABNORMAL LOW (ref 39.0–52.0)
Hemoglobin: 8.9 g/dL — ABNORMAL LOW (ref 13.0–17.0)
Lymphocytes Relative: 27 %
Lymphs Abs: 1.4 10*3/uL (ref 0.7–4.0)
MCH: 33.5 pg (ref 26.0–34.0)
MCHC: 29.7 g/dL — ABNORMAL LOW (ref 30.0–36.0)
MCV: 112.8 fL — ABNORMAL HIGH (ref 80.0–100.0)
Metamyelocytes Relative: 2 %
Monocytes Absolute: 0.3 10*3/uL (ref 0.1–1.0)
Monocytes Relative: 5 %
Myelocytes: 3 %
Neutro Abs: 3.1 10*3/uL (ref 1.7–7.7)
Neutrophils Relative %: 61 %
Platelets: 715 10*3/uL — ABNORMAL HIGH (ref 150–400)
Promyelocytes Relative: 2 %
RBC: 2.66 MIL/uL — ABNORMAL LOW (ref 4.22–5.81)
RDW: 23.9 % — ABNORMAL HIGH (ref 11.5–15.5)
WBC: 5 10*3/uL (ref 4.0–10.5)
nRBC: 1.2 % — ABNORMAL HIGH (ref 0.0–0.2)

## 2021-04-23 LAB — COMPREHENSIVE METABOLIC PANEL
ALT: 29 U/L (ref 0–44)
AST: 18 U/L (ref 15–41)
Albumin: 3.9 g/dL (ref 3.5–5.0)
Alkaline Phosphatase: 43 U/L (ref 38–126)
Anion gap: 8 (ref 5–15)
BUN: 19 mg/dL (ref 8–23)
CO2: 25 mmol/L (ref 22–32)
Calcium: 8.9 mg/dL (ref 8.9–10.3)
Chloride: 101 mmol/L (ref 98–111)
Creatinine, Ser: 1.19 mg/dL (ref 0.61–1.24)
GFR, Estimated: >60 mL/min (ref >60)
Glucose, Bld: 92 mg/dL (ref 70–99)
Potassium: 4.2 mmol/L (ref 3.5–5.1)
Sodium: 134 mmol/L — ABNORMAL LOW (ref 135–145)
Total Bilirubin: 0.8 mg/dL (ref 0.3–1.2)
Total Protein: 6.2 g/dL — ABNORMAL LOW (ref 6.5–8.1)

## 2021-04-23 LAB — LACTATE DEHYDROGENASE: LDH: 245 U/L — ABNORMAL HIGH (ref 98–192)

## 2021-04-23 NOTE — Progress Notes (Signed)
Pt is taking Hydrea as prescribed with no side effects.

## 2021-04-23 NOTE — Patient Instructions (Addendum)
[place] at [facility] Discharge Instructions  You were seen today by Tarri Abernethy PA-C for your elevated platelets (thrombocytosis) and anemia.  Your platelets have increased since your  Hydrea dose was decreased, but we need to check some other labs before we change your Hydrea again.  We do not know why you have become anemic, so we are checking other labs to see if this is related to the Hydrea or to something else.  LABS: Return in 2 weeks to discuss results  MEDICATIONS: STOP taking your Valsartan until you have followed up with your primary care doctor and received further instructions. Continue taking 2 tablets of Hydrea per day  FOLLOW-UP APPOINTMENT: Return in 2 weeks   Thank you for choosing [place] at [facility] to provide your oncology and hematology care.  To afford each patient quality time with our provider, please arrive at least 15 minutes before your scheduled appointment time.   If you have a lab appointment with the [facility] please come in thru the Main Entrance and check in at the main information desk.  You need to re-schedule your appointment should you arrive 10 or more minutes late.  We strive to give you quality time with our providers, and arriving late affects you and other patients whose appointments are after yours.  Also, if you no show three or more times for appointments you may be dismissed from the clinic at the providers discretion.     Again, thank you for choosing [facility].  Our hope is that these requests will decrease the amount of time that you wait before being seen by our physicians.       _____________________________________________________________  Should you have questions after your visit to [facility], please contact our office at [phone] and follow the prompts.  Our office hours are 8:00 a.m. and 4:30 p.m. Monday - Friday.  Please note that  voicemails left after 4:00 p.m. may not be returned until the following business day.  We are closed weekends and major holidays.  You do have access to a nurse 24-7, just call the main number to the clinic [phone] and do not press any options, hold on the line and a nurse will answer the phone.    For prescription refill requests, have your pharmacy contact our office and allow 72 hours.    Due to Covid, you will need to wear a mask upon entering the hospital. If you do not have a mask, a mask will be given to you at the Main Entrance upon arrival. For doctor visits, patients may have 1 support person age 79 or older with them. For treatment visits, patients can not have anyone with them due to social distancing guidelines and our immunocompromised population.

## 2021-04-24 LAB — PATHOLOGIST SMEAR REVIEW

## 2021-04-25 DIAGNOSIS — R2681 Unsteadiness on feet: Secondary | ICD-10-CM | POA: Diagnosis not present

## 2021-04-25 DIAGNOSIS — R29898 Other symptoms and signs involving the musculoskeletal system: Secondary | ICD-10-CM | POA: Diagnosis not present

## 2021-04-25 DIAGNOSIS — S8292XD Unspecified fracture of left lower leg, subsequent encounter for closed fracture with routine healing: Secondary | ICD-10-CM | POA: Diagnosis not present

## 2021-04-25 DIAGNOSIS — R262 Difficulty in walking, not elsewhere classified: Secondary | ICD-10-CM | POA: Diagnosis not present

## 2021-04-27 ENCOUNTER — Other Ambulatory Visit: Payer: Self-pay

## 2021-04-27 ENCOUNTER — Inpatient Hospital Stay (HOSPITAL_COMMUNITY): Payer: Medicare Other

## 2021-04-27 DIAGNOSIS — D473 Essential (hemorrhagic) thrombocythemia: Secondary | ICD-10-CM

## 2021-04-27 DIAGNOSIS — N529 Male erectile dysfunction, unspecified: Secondary | ICD-10-CM | POA: Diagnosis not present

## 2021-04-27 DIAGNOSIS — D539 Nutritional anemia, unspecified: Secondary | ICD-10-CM | POA: Diagnosis not present

## 2021-04-27 DIAGNOSIS — I959 Hypotension, unspecified: Secondary | ICD-10-CM | POA: Diagnosis not present

## 2021-04-27 DIAGNOSIS — K1370 Unspecified lesions of oral mucosa: Secondary | ICD-10-CM | POA: Diagnosis not present

## 2021-04-27 DIAGNOSIS — E538 Deficiency of other specified B group vitamins: Secondary | ICD-10-CM

## 2021-04-27 DIAGNOSIS — G620 Drug-induced polyneuropathy: Secondary | ICD-10-CM | POA: Diagnosis not present

## 2021-04-27 DIAGNOSIS — D649 Anemia, unspecified: Secondary | ICD-10-CM

## 2021-04-27 LAB — OCCULT BLOOD X 1 CARD TO LAB, STOOL
Fecal Occult Bld: POSITIVE — AB
Fecal Occult Bld: POSITIVE — AB
Fecal Occult Bld: POSITIVE — AB

## 2021-04-27 LAB — FERRITIN: Ferritin: 402 ng/mL — ABNORMAL HIGH (ref 24–336)

## 2021-04-27 LAB — RETICULOCYTES
Immature Retic Fract: 33.3 % — ABNORMAL HIGH (ref 2.3–15.9)
RBC.: 2.73 MIL/uL — ABNORMAL LOW (ref 4.22–5.81)
Retic Count, Absolute: 80.3 10*3/uL (ref 19.0–186.0)
Retic Ct Pct: 2.9 % (ref 0.4–3.1)

## 2021-04-27 LAB — FOLATE: Folate: 9.6 ng/mL (ref >5.9)

## 2021-04-27 LAB — IRON AND TIBC
Iron: 102 ug/dL (ref 45–182)
Saturation Ratios: 28 % (ref 17.9–39.5)
TIBC: 363 ug/dL (ref 250–450)
UIBC: 261 ug/dL

## 2021-04-27 LAB — VITAMIN B12: Vitamin B-12: 281 pg/mL (ref 180–914)

## 2021-04-28 LAB — HAPTOGLOBIN: Haptoglobin: 102 mg/dL (ref 34–355)

## 2021-04-28 LAB — ERYTHROPOIETIN: Erythropoietin: 67.2 m[IU]/mL — ABNORMAL HIGH (ref 2.6–18.5)

## 2021-04-30 LAB — IMMUNOFIXATION ELECTROPHORESIS
IgA: 107 mg/dL (ref 61–437)
IgG (Immunoglobin G), Serum: 419 mg/dL — ABNORMAL LOW (ref 603–1613)
IgM (Immunoglobulin M), Srm: 52 mg/dL (ref 15–143)
Total Protein ELP: 5.7 g/dL — ABNORMAL LOW (ref 6.0–8.5)

## 2021-04-30 LAB — PROTEIN ELECTROPHORESIS, SERUM
A/G Ratio: 1.6 (ref 0.7–1.7)
Albumin ELP: 3.6 g/dL (ref 2.9–4.4)
Alpha-1-Globulin: 0.2 g/dL (ref 0.0–0.4)
Alpha-2-Globulin: 0.7 g/dL (ref 0.4–1.0)
Beta Globulin: 0.9 g/dL (ref 0.7–1.3)
Gamma Globulin: 0.4 g/dL (ref 0.4–1.8)
Globulin, Total: 2.2 g/dL (ref 2.2–3.9)
Total Protein ELP: 5.8 g/dL — ABNORMAL LOW (ref 6.0–8.5)

## 2021-04-30 LAB — KAPPA/LAMBDA LIGHT CHAINS
Kappa free light chain: 12.1 mg/L (ref 3.3–19.4)
Kappa, lambda light chain ratio: 1.48 (ref 0.26–1.65)
Lambda free light chains: 8.2 mg/L (ref 5.7–26.3)

## 2021-05-01 DIAGNOSIS — R2681 Unsteadiness on feet: Secondary | ICD-10-CM | POA: Diagnosis not present

## 2021-05-01 DIAGNOSIS — R262 Difficulty in walking, not elsewhere classified: Secondary | ICD-10-CM | POA: Diagnosis not present

## 2021-05-01 DIAGNOSIS — R29898 Other symptoms and signs involving the musculoskeletal system: Secondary | ICD-10-CM | POA: Diagnosis not present

## 2021-05-01 DIAGNOSIS — S8292XD Unspecified fracture of left lower leg, subsequent encounter for closed fracture with routine healing: Secondary | ICD-10-CM | POA: Diagnosis not present

## 2021-05-01 LAB — COPPER, SERUM: Copper: 94 ug/dL (ref 69–132)

## 2021-05-04 DIAGNOSIS — R29898 Other symptoms and signs involving the musculoskeletal system: Secondary | ICD-10-CM | POA: Diagnosis not present

## 2021-05-04 DIAGNOSIS — S8292XD Unspecified fracture of left lower leg, subsequent encounter for closed fracture with routine healing: Secondary | ICD-10-CM | POA: Diagnosis not present

## 2021-05-04 DIAGNOSIS — R2681 Unsteadiness on feet: Secondary | ICD-10-CM | POA: Diagnosis not present

## 2021-05-04 DIAGNOSIS — R262 Difficulty in walking, not elsewhere classified: Secondary | ICD-10-CM | POA: Diagnosis not present

## 2021-05-05 LAB — METHYLMALONIC ACID, SERUM: Methylmalonic Acid, Quantitative: 291 nmol/L (ref 0–378)

## 2021-05-08 ENCOUNTER — Other Ambulatory Visit (HOSPITAL_COMMUNITY): Payer: Self-pay | Admitting: Physician Assistant

## 2021-05-08 DIAGNOSIS — S8292XD Unspecified fracture of left lower leg, subsequent encounter for closed fracture with routine healing: Secondary | ICD-10-CM | POA: Diagnosis not present

## 2021-05-08 DIAGNOSIS — D649 Anemia, unspecified: Secondary | ICD-10-CM

## 2021-05-08 DIAGNOSIS — R262 Difficulty in walking, not elsewhere classified: Secondary | ICD-10-CM | POA: Diagnosis not present

## 2021-05-08 DIAGNOSIS — R29898 Other symptoms and signs involving the musculoskeletal system: Secondary | ICD-10-CM | POA: Diagnosis not present

## 2021-05-08 DIAGNOSIS — R2681 Unsteadiness on feet: Secondary | ICD-10-CM | POA: Diagnosis not present

## 2021-05-09 ENCOUNTER — Inpatient Hospital Stay (HOSPITAL_COMMUNITY): Payer: Medicare Other | Attending: Hematology

## 2021-05-09 ENCOUNTER — Other Ambulatory Visit: Payer: Self-pay

## 2021-05-09 ENCOUNTER — Inpatient Hospital Stay (HOSPITAL_BASED_OUTPATIENT_CLINIC_OR_DEPARTMENT_OTHER): Payer: Medicare Other | Admitting: Physician Assistant

## 2021-05-09 VITALS — BP 111/63 | HR 65 | Temp 98.1°F | Resp 18 | Wt 217.2 lb

## 2021-05-09 DIAGNOSIS — Z87891 Personal history of nicotine dependence: Secondary | ICD-10-CM | POA: Insufficient documentation

## 2021-05-09 DIAGNOSIS — D539 Nutritional anemia, unspecified: Secondary | ICD-10-CM | POA: Diagnosis not present

## 2021-05-09 DIAGNOSIS — Z85820 Personal history of malignant melanoma of skin: Secondary | ICD-10-CM | POA: Insufficient documentation

## 2021-05-09 DIAGNOSIS — Z85038 Personal history of other malignant neoplasm of large intestine: Secondary | ICD-10-CM | POA: Diagnosis not present

## 2021-05-09 DIAGNOSIS — D649 Anemia, unspecified: Secondary | ICD-10-CM

## 2021-05-09 DIAGNOSIS — D473 Essential (hemorrhagic) thrombocythemia: Secondary | ICD-10-CM

## 2021-05-09 LAB — CBC WITH DIFFERENTIAL/PLATELET
Abs Immature Granulocytes: 0.35 10*3/uL — ABNORMAL HIGH (ref 0.00–0.07)
Basophils Absolute: 0.1 10*3/uL (ref 0.0–0.1)
Basophils Relative: 2 %
Eosinophils Absolute: 0 10*3/uL (ref 0.0–0.5)
Eosinophils Relative: 1 %
HCT: 30.9 % — ABNORMAL LOW (ref 39.0–52.0)
Hemoglobin: 9.1 g/dL — ABNORMAL LOW (ref 13.0–17.0)
Immature Granulocytes: 6 %
Lymphocytes Relative: 16 %
Lymphs Abs: 1 10*3/uL (ref 0.7–4.0)
MCH: 33.3 pg (ref 26.0–34.0)
MCHC: 29.4 g/dL — ABNORMAL LOW (ref 30.0–36.0)
MCV: 113.2 fL — ABNORMAL HIGH (ref 80.0–100.0)
Monocytes Absolute: 0.5 10*3/uL (ref 0.1–1.0)
Monocytes Relative: 9 %
Neutro Abs: 4 10*3/uL (ref 1.7–7.7)
Neutrophils Relative %: 66 %
Platelets: 581 10*3/uL — ABNORMAL HIGH (ref 150–400)
RBC: 2.73 MIL/uL — ABNORMAL LOW (ref 4.22–5.81)
RDW: 20.4 % — ABNORMAL HIGH (ref 11.5–15.5)
WBC: 6 10*3/uL (ref 4.0–10.5)
nRBC: 0.7 % — ABNORMAL HIGH (ref 0.0–0.2)

## 2021-05-09 NOTE — Patient Instructions (Signed)
[place] at [name] Discharge Instructions  You were seen today by Tarri Abernethy PA-C for your essential thrombocytosis (elevated platelets) and anemia.      LABS: Return in 8 weeks for labs   OTHER TESTS: Referral sent to Dr. Janeth Rase  in [place], [place] for EGD.  Please call her office to set up an appointment.  MEDICATIONS: Continue to take 2 tablets of Hydrea each day.  FOLLOW-UP APPOINTMENT: Office visit in 8 weeks   Thank you for choosing [place] at [facility] to provide your oncology and hematology care.  To afford each patient quality time with our provider, please arrive at least 15 minutes before your scheduled appointment time.   If you have a lab appointment with the [place] please come in thru the Main Entrance and check in at the main information desk.  You need to re-schedule your appointment should you arrive 10 or more minutes late.  We strive to give you quality time with our providers, and arriving late affects you and other patients whose appointments are after yours.  Also, if you no show three or more times for appointments you may be dismissed from the clinic at the providers discretion.     Again, thank you for choosing [facility].  Our hope is that these requests will decrease the amount of time that you wait before being seen by our physicians.       _____________________________________________________________  Should you have questions after your visit to [facility], please contact our office at [phone] and follow the prompts.  Our office hours are 8:00 a.m. and 4:30 p.m. Monday - Friday.  Please note that voicemails left after 4:00 p.m. may not be returned until the following business day.  We are closed weekends and major holidays.  You do have access to a nurse 24-7, just call the main number to the clinic [phone] and do not press any options, hold on the  line and a nurse will answer the phone.    For prescription refill requests, have your pharmacy contact our office and allow 72 hours.    Due to Covid, you will need to wear a mask upon entering the hospital. If you do not have a mask, a mask will be given to you at the Main Entrance upon arrival. For doctor visits, patients may have 1 support person age 35 or older with them. For treatment visits, patients can not have anyone with them due to social distancing guidelines and our immunocompromised population.

## 2021-05-09 NOTE — Progress Notes (Signed)
Justin Berger, [place] [id number]   CLINIC:  Medical Oncology/Hematology  PCP:  Caryl Bis, MD [place] [address] [phone]   REASON FOR VISIT:  Follow-up for essential thrombocytosis and anemia  PRIOR THERAPY: None  CURRENT THERAPY: Hydrea 2 tablets daily.  Aspirin 81 mg daily.  INTERVAL HISTORY:  Justin Berger 70 y.o. male returns for routine follow-up of his essential thrombocytosis and anemia.  He was last seen by Tarri Abernethy PA-C on 04/23/2021.  As noted at last visit, patient sustained a fall with ankle fracture in February 2022, and has had a prolonged recovery as he works to regain strength and the ability to walk.  He continues to make slow progress and is receiving outpatient physical therapy.  Patient reports that his fatigue continues to slowly improve after Hydrea was decreased to 2 tablets daily.  He reports that his current energy level is about 60%.  His appetite has been good, and he continues to slowly regain the weight that he lost while he was in rehab.  He denies any fever, chills, night sweats, malaise.  No new masses or lymphadenopathy.  At last visit, patient was noted to have acute anemia with Hgb 8.3 (per outside labs on 04/08/2021).  Repeat CBC obtained today (05/09/2021) shows improving Hgb at 9.1.  Hemoccult stool cards x 3 were all positive for occult blood.  Justin Berger admits to occasional blood spotting on tissue after a bowel movement, but denies any gross rectal hemorrhage, melena, epistaxis, hematemesis, hematuria, or hemoptysis.  His last colonoscopy was on 08/25/2020 at [facility] by Dr. Adelina Mings - showed normal colon apart from venous lake in the left colon.  Patient does not recall having ever had an EGD.  He is currently taking 2 tablets Hydrea daily.  His platelet count has improved, with platelets 581 today (compared to 715 on 04/23/2021).  He is tolerating Hydrea well.  CBC shows  expected macrocytosis.  Patient denies cutaneous ulcers and nonhealing skin wounds.  No complaints of mucous cytosis.  Denies gastrointestinal symptoms such as gastritis, nausea, vomiting, diarrhea.     REVIEW OF SYSTEMS:  Review of Systems  Constitutional: Positive for fatigue (energy 60%). Negative for appetite change, chills, diaphoresis, fever and unexpected weight change.  HENT:   Negative for lump/mass and nosebleeds.   Eyes: Negative for eye problems.  Respiratory: Negative for cough, hemoptysis and shortness of breath.   Cardiovascular: Negative for chest pain, leg swelling and palpitations.  Gastrointestinal: Positive for constipation. Negative for abdominal pain, blood in stool, diarrhea, nausea and vomiting.  Genitourinary: Negative for hematuria.   Musculoskeletal: Positive for gait problem (off balance).  Skin: Negative.   Neurological: Positive for gait problem (off balance) and numbness (left leg). Negative for dizziness, headaches and light-headedness.  Hematological: Does not bruise/bleed easily.      PAST MEDICAL/SURGICAL HISTORY:  Past Medical History:  Diagnosis Date  . Diabetes ([place])   . Hypertension    No past surgical history on file.   SOCIAL HISTORY:  Social History   Socioeconomic History  . Marital status: Married    Spouse name: Not on file  . Number of children: Not on file  . Years of education: Not on file  . Highest education level: Not on file  Occupational History  . Not on file  Tobacco Use  . Smoking status: Former Smoker    Packs/day: 0.50    Years: 3.00  Pack years: 1.50    Types: Cigarettes    Quit date: 04/08/1978    Years since quitting: 43.1  . Smokeless tobacco: Never Used  Vaping Use  . Vaping Use: Never used  Substance and Sexual Activity  . Alcohol use: Never  . Drug use: Never  . Sexual activity: Not on file  Other Topics Concern  . Not on file  Social History Narrative  . Not on file   Social Determinants of  Health   Financial Resource Strain: Low Risk   . Difficulty of Paying Living Expenses: Not hard at all  Food Insecurity: No Food Insecurity  . Worried About Charity fundraiser in the Last Year: Never true  . Ran Out of Food in the Last Year: Never true  Transportation Needs: No Transportation Needs  . Lack of Transportation (Medical): No  . Lack of Transportation (Non-Medical): No  Physical Activity: Inactive  . Days of Exercise per Week: 0 days  . Minutes of Exercise per Session: 0 min  Stress: No Stress Concern Present  . Feeling of Stress : Not at all  Social Connections: Moderately Integrated  . Frequency of Communication with Friends and Family: More than three times a week  . Frequency of Social Gatherings with Friends and Family: Three times a week  . Attends Religious Services: 1 to 4 times per year  . Active Member of Clubs or Organizations: No  . Attends Archivist Meetings: Never  . Marital Status: Married  Human resources officer Violence: Not At Risk  . Fear of Current or Ex-Partner: No  . Emotionally Abused: No  . Physically Abused: No  . Sexually Abused: No    FAMILY HISTORY:  Family History  Problem Relation Age of Onset  . Dementia Mother   . Cancer Paternal Uncle        lung    CURRENT MEDICATIONS:  Outpatient Encounter Medications as of 05/09/2021  Medication Sig  . ACCU-CHEK AVIVA PLUS test strip TEST BLOOD SUGAR D  . allopurinol (ZYLOPRIM) 300 MG tablet Take 300 mg by mouth daily.  [name] aspirin 81 MG EC tablet Take by mouth.  [name] atorvastatin (LIPITOR) 10 MG tablet Take 10 mg by mouth once a week.  . clonazePAM (KLONOPIN) 0.5 MG tablet Take 0.5 mg by mouth 2 (two) times daily as needed.   . cyclobenzaprine (FLEXERIL) 10 MG tablet Take 10 mg by mouth as needed.   . DULoxetine (CYMBALTA) 60 MG capsule Take 1 capsule by mouth daily.  [name] gabapentin (NEURONTIN) 300 MG capsule Take 300 mg by mouth at bedtime.  [name] glipiZIDE (GLUCOTROL XL) 10 MG 24 hr tablet  Take 10 mg by mouth daily.  . hydroxyurea (HYDREA) 500 MG capsule Take 2 capsules (1,000 mg total) by mouth daily. Take 3 tablets Monday through Friday. Take 2 tablets on Saturday and Sunday (Patient taking differently: Take 1,000 mg by mouth daily. Take 2 tablets every day)  . metFORMIN (GLUCOPHAGE-XR) 500 MG 24 hr tablet Take 500 mg by mouth daily.   . metoprolol succinate (TOPROL-XL) 50 MG 24 hr tablet Take 50 mg by mouth daily.   . potassium chloride SA (KLOR-CON) 20 MEQ tablet Take 1 tablet by mouth daily.  . promethazine (PHENERGAN) 12.5 MG tablet Take 12.5 mg by mouth as needed.   . sildenafil (VIAGRA) 50 MG tablet Take 1 tablet (50 mg total) by mouth daily as needed for erectile dysfunction.  . traMADol (ULTRAM) 50 MG tablet Take 50 mg by  mouth every 8 (eight) hours as needed.  . valsartan (DIOVAN) 80 MG tablet Take 80 mg by mouth daily.   No facility-administered encounter medications on file as of 05/09/2021.    ALLERGIES:  Allergies  Allergen Reactions  . Ivp Dye  [Iodinated Diagnostic Agents]      PHYSICAL EXAM:  ECOG PERFORMANCE STATUS: 2 - Symptomatic, <50% confined to bed  There were no vitals filed for this visit. There were no vitals filed for this visit. Physical Exam Constitutional:      Appearance: Normal appearance. He is obese.  HENT:     Head: Normocephalic and atraumatic.     Mouth/Throat:     Mouth: Mucous membranes are moist.  Eyes:     Extraocular Movements: Extraocular movements intact.     Pupils: Pupils are equal, round, and reactive to light.  Cardiovascular:     Rate and Rhythm: Normal rate and regular rhythm.     Pulses: Normal pulses.     Heart sounds: Normal heart sounds.  Pulmonary:     Effort: Pulmonary effort is normal.     Breath sounds: Normal breath sounds.  Abdominal:     General: Bowel sounds are normal.     Palpations: Abdomen is soft.     Tenderness: There is no abdominal tenderness.  Musculoskeletal:        General: No  swelling.     Right lower leg: No edema.     Left lower leg: Edema (trace left ankle edema) present.  Lymphadenopathy:     Cervical: No cervical adenopathy.  Skin:    General: Skin is warm and dry.     Coloration: Skin is pale.  Neurological:     General: No focal deficit present.     Mental Status: He is alert and oriented to person, place, and time.  Psychiatric:        Mood and Affect: Mood normal.        Behavior: Behavior normal.      LABORATORY DATA:  I have reviewed the labs as listed.  CBC    Component Value Date/Time   WBC 6.0 05/09/2021 0911   RBC 2.73 (L) 05/09/2021 0911   HGB 9.1 (L) 05/09/2021 0911   HCT 30.9 (L) 05/09/2021 0911   PLT 581 (H) 05/09/2021 0911   MCV 113.2 (H) 05/09/2021 0911   MCH 33.3 05/09/2021 0911   MCHC 29.4 (L) 05/09/2021 0911   RDW 20.4 (H) 05/09/2021 0911   LYMPHSABS PENDING 05/09/2021 0911   MONOABS PENDING 05/09/2021 0911   EOSABS PENDING 05/09/2021 0911   BASOSABS PENDING 05/09/2021 0911   CMP Latest Ref Rng & Units 04/23/2021 11/29/2020 09/19/2020  Glucose 70 - 99 mg/dL 92 220(H) 189(H)  BUN 8 - 23 mg/dL _0 Creatinine 0.61 - 1.24 mg/dL 1.19 1.22 1.19  Sodium 135 - 145 mmol/L 134(L) 134(L) 139  Potassium 3.5 - 5.1 mmol/L 4.2 4.1 4.1  Chloride 98 - 111 mmol/L 101 103 104  CO2 22 - 32 mmol/L 25 21(L) 25  Calcium 8.9 - 10.3 mg/dL 8.9 9.6 9.3  Total Protein 6.5 - 8.1 g/dL 6.2(L) 6.8 6.6  Total Bilirubin 0.3 - 1.2 mg/dL 0.8 0.9 0.9  Alkaline Phos 38 - 126 U/L 43 38 32(L)  AST 15 - 41 U/L _1 ALT 0 - 44 U/L 29 34 26    DIAGNOSTIC IMAGING:  I have independently reviewed the relevant imaging and discussed with the patient.  ASSESSMENT: 1. Essential  thrombocytosis, CALR+: -High risk disease with history of thrombosis (left leg DVT) andage more than 60. - Bone marrow biopsy 12/10/2017 by Dr. Delton Coombes at [facility] in [place] cytometty for leukemia/lymphoma was non-diagnostic: "No  significant diagnostic immunophenotypic abnormality detected."  Pathology review showed 60% cellularity with significant megakaryocytic hyperplasia with clusters of megakaryocytes; reticulin stain shows slight (Grade 1) reticulin fibrosis, some centered around areas of megakaryocyte clusters  Cytogenic analysis showed normal male karyotype 46,XY[20]  Noted to be at risk for progression to post essential thrombocythemia related myelofibrosis, but peripheral blood did not show other features of myelofibrosis, such as leukoerythroblastic blood or atypical megakaryocyte morphology -No vasomotor symptoms. No aquagenic pruritus. -Hydroxyurea started in December 2018. He is on aspirin 81 mg daily. -He is currently taking hydroxyurea 2 tablets daily (was previously taking 3 tablets M-F, but this was decreased while he was hospitalized due to anemia and fatigue) - Most recent labs  (05/09/2021) show improved platelets 581, compared to 715 on 04/18/2021  - He is tolerating Hydrea well without side effects - CT abdomen and pelvis on 09/29/2017 showed splenomegaly with spleen 14.5 cm  2.  Macrocytic anemia -Labs obtained at [facility] on 04/08/2021 show that the patient has had onset of anemia between December 2021 (Hgb 12.0) and April 2022 (Hgb 8.3) - Macrocytosis due to hydroxyurea - Symptomatic with fatigue - Per labs on 04/23/2021:  LDH was slightly elevated at 245.  Erythropoietin was elevated at 67.2.  Reticulocytes 2.9%, with reticulocyte index < 2 indicating hypoproliferation.  Peripheral smear showed macrocytic anemia, neutrophilic left shift, and thrombocytosis.   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 Personal history of colon cancer - Patient reports history of stage II colon cancer in ~ 2004 - Underwent right hemicolectomy and chemotherapy with Leukovorin and 5FU - Most recent colonoscopy in August 2021 was normal, due for repeat colonoscopy in 5 years  4. Personal history of melanoma - Isolated lesion of right leg melanoma s/p surgical excision "many years ago"   PLAN:  1. Essential thrombocytosis, CALR+: -We reviewed labs from today. Platelet count is improved (581), but not yet at goal -Patient will continue taking 2 tablets of Hydrea daily -Physical examination shows no palpable adenopathy. No palpable splenomegaly on exam.  -Repeat CBC and RTC in 8 weeks -Consider increased dose of Hydrea if inadequate response  2.  Macrocytic anemia - Macrocytosis due to hydroxyurea - Differential diagnosis includes:  Anemia due to myelosuppressive effects of Hydrea - continue Hydrea at decreased dose of 2 tabs QD and monitor for RBC response  Anemia d/t GI bleed - referral sent for EGD with Dr. 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  Fatigue - Ongoing  fatigue for several months  - Possibly secondary to anemia as above, dose of Hydrea, chronic diseases, or current recovery from left ankle fracture - Dose of Hydrea was decreased, and he reports slightly increased energy levels after change in Hydrea dose - TSH 1.475 and testosterone 301 checked on 11/29/2020, results within normal limits   PLAN SUMMARY & DISPOSITION: - Labs and RTC in  8 weeks - Referral for EGD (Dr. Rocco Serene in [facility])   All questions were answered. The patient knows to call the clinic with any problems, questions or concerns.  Medical decision making: Moderate (2 chronic diseases not yet stable, review of prior labs, ordering new tests)  Time spent on visit: I spent 20 minutes counseling the patient face to face. The total time spent in the appointment was 40 minutes and more than 50% was on counseling.   Harriett Rush, PA-C  05/09/21 9:55 AM

## 2021-05-11 DIAGNOSIS — R29898 Other symptoms and signs involving the musculoskeletal system: Secondary | ICD-10-CM | POA: Diagnosis not present

## 2021-05-11 DIAGNOSIS — R262 Difficulty in walking, not elsewhere classified: Secondary | ICD-10-CM | POA: Diagnosis not present

## 2021-05-11 DIAGNOSIS — S8292XD Unspecified fracture of left lower leg, subsequent encounter for closed fracture with routine healing: Secondary | ICD-10-CM | POA: Diagnosis not present

## 2021-05-11 DIAGNOSIS — R2681 Unsteadiness on feet: Secondary | ICD-10-CM | POA: Diagnosis not present

## 2021-05-13 DIAGNOSIS — D473 Essential (hemorrhagic) thrombocythemia: Secondary | ICD-10-CM | POA: Diagnosis not present

## 2021-05-13 DIAGNOSIS — G622 Polyneuropathy due to other toxic agents: Secondary | ICD-10-CM | POA: Diagnosis not present

## 2021-05-13 DIAGNOSIS — H353290 Exudative age-related macular degeneration, unspecified eye, stage unspecified: Secondary | ICD-10-CM | POA: Diagnosis not present

## 2021-05-13 DIAGNOSIS — E1165 Type 2 diabetes mellitus with hyperglycemia: Secondary | ICD-10-CM | POA: Diagnosis not present

## 2021-05-14 DIAGNOSIS — R262 Difficulty in walking, not elsewhere classified: Secondary | ICD-10-CM | POA: Diagnosis not present

## 2021-05-14 DIAGNOSIS — S8292XD Unspecified fracture of left lower leg, subsequent encounter for closed fracture with routine healing: Secondary | ICD-10-CM | POA: Diagnosis not present

## 2021-05-14 DIAGNOSIS — R2681 Unsteadiness on feet: Secondary | ICD-10-CM | POA: Diagnosis not present

## 2021-05-14 DIAGNOSIS — R29898 Other symptoms and signs involving the musculoskeletal system: Secondary | ICD-10-CM | POA: Diagnosis not present

## 2021-05-16 DIAGNOSIS — R2681 Unsteadiness on feet: Secondary | ICD-10-CM | POA: Diagnosis not present

## 2021-05-16 DIAGNOSIS — S8292XD Unspecified fracture of left lower leg, subsequent encounter for closed fracture with routine healing: Secondary | ICD-10-CM | POA: Diagnosis not present

## 2021-05-16 DIAGNOSIS — R262 Difficulty in walking, not elsewhere classified: Secondary | ICD-10-CM | POA: Diagnosis not present

## 2021-05-16 DIAGNOSIS — R29898 Other symptoms and signs involving the musculoskeletal system: Secondary | ICD-10-CM | POA: Diagnosis not present

## 2021-05-17 DIAGNOSIS — D649 Anemia, unspecified: Secondary | ICD-10-CM | POA: Diagnosis not present

## 2021-05-17 DIAGNOSIS — R195 Other fecal abnormalities: Secondary | ICD-10-CM | POA: Diagnosis not present

## 2021-05-21 DIAGNOSIS — M76892 Other specified enthesopathies of left lower limb, excluding foot: Secondary | ICD-10-CM | POA: Diagnosis not present

## 2021-05-21 DIAGNOSIS — R2681 Unsteadiness on feet: Secondary | ICD-10-CM | POA: Diagnosis not present

## 2021-05-21 DIAGNOSIS — S8292XD Unspecified fracture of left lower leg, subsequent encounter for closed fracture with routine healing: Secondary | ICD-10-CM | POA: Diagnosis not present

## 2021-05-21 DIAGNOSIS — M79652 Pain in left thigh: Secondary | ICD-10-CM | POA: Diagnosis not present

## 2021-05-22 DIAGNOSIS — M76899 Other specified enthesopathies of unspecified lower limb, excluding foot: Secondary | ICD-10-CM | POA: Diagnosis not present

## 2021-05-23 DIAGNOSIS — S8292XD Unspecified fracture of left lower leg, subsequent encounter for closed fracture with routine healing: Secondary | ICD-10-CM | POA: Diagnosis not present

## 2021-05-23 DIAGNOSIS — R2681 Unsteadiness on feet: Secondary | ICD-10-CM | POA: Diagnosis not present

## 2021-05-23 DIAGNOSIS — M76892 Other specified enthesopathies of left lower limb, excluding foot: Secondary | ICD-10-CM | POA: Diagnosis not present

## 2021-05-23 DIAGNOSIS — M79652 Pain in left thigh: Secondary | ICD-10-CM | POA: Diagnosis not present

## 2021-05-25 DIAGNOSIS — Z882 Allergy status to sulfonamides status: Secondary | ICD-10-CM | POA: Diagnosis not present

## 2021-05-25 DIAGNOSIS — Z7982 Long term (current) use of aspirin: Secondary | ICD-10-CM | POA: Diagnosis not present

## 2021-05-25 DIAGNOSIS — E782 Mixed hyperlipidemia: Secondary | ICD-10-CM | POA: Diagnosis not present

## 2021-05-25 DIAGNOSIS — L539 Erythematous condition, unspecified: Secondary | ICD-10-CM | POA: Diagnosis not present

## 2021-05-25 DIAGNOSIS — K209 Esophagitis, unspecified without bleeding: Secondary | ICD-10-CM | POA: Diagnosis not present

## 2021-05-25 DIAGNOSIS — I2510 Atherosclerotic heart disease of native coronary artery without angina pectoris: Secondary | ICD-10-CM | POA: Diagnosis not present

## 2021-05-25 DIAGNOSIS — Z87891 Personal history of nicotine dependence: Secondary | ICD-10-CM | POA: Diagnosis not present

## 2021-05-25 DIAGNOSIS — D649 Anemia, unspecified: Secondary | ICD-10-CM | POA: Diagnosis not present

## 2021-05-25 DIAGNOSIS — Z7984 Long term (current) use of oral hypoglycemic drugs: Secondary | ICD-10-CM | POA: Diagnosis not present

## 2021-05-25 DIAGNOSIS — K2970 Gastritis, unspecified, without bleeding: Secondary | ICD-10-CM | POA: Diagnosis not present

## 2021-05-25 DIAGNOSIS — I8500 Esophageal varices without bleeding: Secondary | ICD-10-CM | POA: Diagnosis not present

## 2021-05-25 DIAGNOSIS — I712 Thoracic aortic aneurysm, without rupture: Secondary | ICD-10-CM | POA: Diagnosis not present

## 2021-05-25 DIAGNOSIS — I10 Essential (primary) hypertension: Secondary | ICD-10-CM | POA: Diagnosis not present

## 2021-05-25 DIAGNOSIS — E1141 Type 2 diabetes mellitus with diabetic mononeuropathy: Secondary | ICD-10-CM | POA: Diagnosis not present

## 2021-05-25 HISTORY — PX: ESOPHAGOGASTRODUODENOSCOPY: SHX1529

## 2021-05-30 DIAGNOSIS — H26492 Other secondary cataract, left eye: Secondary | ICD-10-CM | POA: Diagnosis not present

## 2021-05-30 DIAGNOSIS — E119 Type 2 diabetes mellitus without complications: Secondary | ICD-10-CM | POA: Diagnosis not present

## 2021-05-30 DIAGNOSIS — Z961 Presence of intraocular lens: Secondary | ICD-10-CM | POA: Diagnosis not present

## 2021-05-30 DIAGNOSIS — H34832 Tributary (branch) retinal vein occlusion, left eye, with macular edema: Secondary | ICD-10-CM | POA: Diagnosis not present

## 2021-05-30 DIAGNOSIS — H40013 Open angle with borderline findings, low risk, bilateral: Secondary | ICD-10-CM | POA: Diagnosis not present

## 2021-05-31 DIAGNOSIS — Z6832 Body mass index (BMI) 32.0-32.9, adult: Secondary | ICD-10-CM | POA: Diagnosis not present

## 2021-05-31 DIAGNOSIS — R2681 Unsteadiness on feet: Secondary | ICD-10-CM | POA: Diagnosis not present

## 2021-05-31 DIAGNOSIS — M76892 Other specified enthesopathies of left lower limb, excluding foot: Secondary | ICD-10-CM | POA: Diagnosis not present

## 2021-05-31 DIAGNOSIS — M109 Gout, unspecified: Secondary | ICD-10-CM | POA: Diagnosis not present

## 2021-05-31 DIAGNOSIS — M25562 Pain in left knee: Secondary | ICD-10-CM | POA: Diagnosis not present

## 2021-05-31 DIAGNOSIS — M79652 Pain in left thigh: Secondary | ICD-10-CM | POA: Diagnosis not present

## 2021-05-31 DIAGNOSIS — S8292XD Unspecified fracture of left lower leg, subsequent encounter for closed fracture with routine healing: Secondary | ICD-10-CM | POA: Diagnosis not present

## 2021-06-04 ENCOUNTER — Encounter (INDEPENDENT_AMBULATORY_CARE_PROVIDER_SITE_OTHER): Payer: Self-pay | Admitting: *Deleted

## 2021-06-04 DIAGNOSIS — M7989 Other specified soft tissue disorders: Secondary | ICD-10-CM | POA: Diagnosis not present

## 2021-06-05 DIAGNOSIS — M79662 Pain in left lower leg: Secondary | ICD-10-CM | POA: Diagnosis not present

## 2021-06-05 DIAGNOSIS — M7989 Other specified soft tissue disorders: Secondary | ICD-10-CM | POA: Diagnosis not present

## 2021-06-07 DIAGNOSIS — M79652 Pain in left thigh: Secondary | ICD-10-CM | POA: Diagnosis not present

## 2021-06-07 DIAGNOSIS — R2681 Unsteadiness on feet: Secondary | ICD-10-CM | POA: Diagnosis not present

## 2021-06-07 DIAGNOSIS — M76892 Other specified enthesopathies of left lower limb, excluding foot: Secondary | ICD-10-CM | POA: Diagnosis not present

## 2021-06-07 DIAGNOSIS — S8292XD Unspecified fracture of left lower leg, subsequent encounter for closed fracture with routine healing: Secondary | ICD-10-CM | POA: Diagnosis not present

## 2021-06-12 DIAGNOSIS — M76892 Other specified enthesopathies of left lower limb, excluding foot: Secondary | ICD-10-CM | POA: Diagnosis not present

## 2021-06-12 DIAGNOSIS — R2681 Unsteadiness on feet: Secondary | ICD-10-CM | POA: Diagnosis not present

## 2021-06-12 DIAGNOSIS — S8292XD Unspecified fracture of left lower leg, subsequent encounter for closed fracture with routine healing: Secondary | ICD-10-CM | POA: Diagnosis not present

## 2021-06-12 DIAGNOSIS — M79652 Pain in left thigh: Secondary | ICD-10-CM | POA: Diagnosis not present

## 2021-06-14 DIAGNOSIS — M79652 Pain in left thigh: Secondary | ICD-10-CM | POA: Diagnosis not present

## 2021-06-14 DIAGNOSIS — R2681 Unsteadiness on feet: Secondary | ICD-10-CM | POA: Diagnosis not present

## 2021-06-14 DIAGNOSIS — M76892 Other specified enthesopathies of left lower limb, excluding foot: Secondary | ICD-10-CM | POA: Diagnosis not present

## 2021-06-14 DIAGNOSIS — S8292XD Unspecified fracture of left lower leg, subsequent encounter for closed fracture with routine healing: Secondary | ICD-10-CM | POA: Diagnosis not present

## 2021-06-18 DIAGNOSIS — S8292XD Unspecified fracture of left lower leg, subsequent encounter for closed fracture with routine healing: Secondary | ICD-10-CM | POA: Diagnosis not present

## 2021-06-18 DIAGNOSIS — R2681 Unsteadiness on feet: Secondary | ICD-10-CM | POA: Diagnosis not present

## 2021-06-18 DIAGNOSIS — M76892 Other specified enthesopathies of left lower limb, excluding foot: Secondary | ICD-10-CM | POA: Diagnosis not present

## 2021-06-18 DIAGNOSIS — M79652 Pain in left thigh: Secondary | ICD-10-CM | POA: Diagnosis not present

## 2021-06-22 DIAGNOSIS — M76899 Other specified enthesopathies of unspecified lower limb, excluding foot: Secondary | ICD-10-CM | POA: Diagnosis not present

## 2021-06-22 DIAGNOSIS — S8292XD Unspecified fracture of left lower leg, subsequent encounter for closed fracture with routine healing: Secondary | ICD-10-CM | POA: Diagnosis not present

## 2021-06-22 DIAGNOSIS — R2681 Unsteadiness on feet: Secondary | ICD-10-CM | POA: Diagnosis not present

## 2021-06-25 DIAGNOSIS — M76899 Other specified enthesopathies of unspecified lower limb, excluding foot: Secondary | ICD-10-CM | POA: Diagnosis not present

## 2021-06-25 DIAGNOSIS — R2681 Unsteadiness on feet: Secondary | ICD-10-CM | POA: Diagnosis not present

## 2021-06-25 DIAGNOSIS — S8292XD Unspecified fracture of left lower leg, subsequent encounter for closed fracture with routine healing: Secondary | ICD-10-CM | POA: Diagnosis not present

## 2021-06-28 DIAGNOSIS — S8292XD Unspecified fracture of left lower leg, subsequent encounter for closed fracture with routine healing: Secondary | ICD-10-CM | POA: Diagnosis not present

## 2021-06-28 DIAGNOSIS — R2681 Unsteadiness on feet: Secondary | ICD-10-CM | POA: Diagnosis not present

## 2021-06-28 DIAGNOSIS — M76899 Other specified enthesopathies of unspecified lower limb, excluding foot: Secondary | ICD-10-CM | POA: Diagnosis not present

## 2021-06-29 DIAGNOSIS — M25572 Pain in left ankle and joints of left foot: Secondary | ICD-10-CM | POA: Diagnosis not present

## 2021-06-29 DIAGNOSIS — N529 Male erectile dysfunction, unspecified: Secondary | ICD-10-CM | POA: Diagnosis not present

## 2021-06-29 DIAGNOSIS — Z6832 Body mass index (BMI) 32.0-32.9, adult: Secondary | ICD-10-CM | POA: Diagnosis not present

## 2021-06-29 DIAGNOSIS — R252 Cramp and spasm: Secondary | ICD-10-CM | POA: Diagnosis not present

## 2021-06-29 DIAGNOSIS — M1712 Unilateral primary osteoarthritis, left knee: Secondary | ICD-10-CM | POA: Diagnosis not present

## 2021-07-03 ENCOUNTER — Encounter: Payer: Self-pay | Admitting: Neurology

## 2021-07-03 DIAGNOSIS — M76899 Other specified enthesopathies of unspecified lower limb, excluding foot: Secondary | ICD-10-CM | POA: Diagnosis not present

## 2021-07-03 DIAGNOSIS — R2681 Unsteadiness on feet: Secondary | ICD-10-CM | POA: Diagnosis not present

## 2021-07-03 DIAGNOSIS — S8292XD Unspecified fracture of left lower leg, subsequent encounter for closed fracture with routine healing: Secondary | ICD-10-CM | POA: Diagnosis not present

## 2021-07-04 NOTE — Progress Notes (Signed)
Justin Berger, Justin Berger   CLINIC:  Medical Oncology/Hematology  PCP:  Loa Socks, MD [address] 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 male returns for routine follow-up of his essential thrombocytosis and macrocytic anemia.  He was last seen by Tarri Abernethy, PA-C on 05/09/2021.   At today's visit, he reports feeling fair.  No recent hospitalizations, surgeries, or changes in baseline health status.   He continues to recover his energy after recent ankle fracture and SNF stay earlier this year.  He reports that they did his fatigue "comes and goes," but that it is slowly trending toward improvement.  He has not noticed any recent bleeding, no epistaxis, gross rectal hemorrhage, or notable melena.  He denies any chest pain, dyspnea on exertion, palpitations, syncope.  At his last visit, he was noted to be Hemoccult stool positive and was referred for EGD with Dr. 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  He has been referred for further follow-up with Dr. Jenetta Downer [facility] gastroenterology).  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  He has been having some issues with balance, reports intermittent dizziness for the past several months.  No headache or vision changes.  He has an appointment coming up with neurology to investigate loss of balance.  Denies any current signs or symptoms of  blood clots.   He is tolerating Hydrea well.  CBC shows expected macrocytosis.  Patient denies cutaneous ulcers and nonhealing skin wounds.  No complaints of mucocytis.  Denies gastrointestinal symptoms such as gastritis, nausea, vomiting, diarrhea.   No, fever, chills, shortness of breath, cough, chest pain, nausea, vomiting, abdominal pain.   He has 75% energy and 100% appetite. He endorses that he is maintaining a stable weight.    REVIEW OF SYSTEMS:  Review of Systems  Constitutional:  Positive for fatigue. Negative for appetite change, chills, diaphoresis, fever and unexpected weight change.  HENT:   Negative for lump/mass and nosebleeds.   Eyes:  Negative for eye problems.  Respiratory:  Negative for cough, hemoptysis and shortness of breath.   Cardiovascular:  Negative for chest pain, leg swelling and palpitations.  Gastrointestinal:  Negative for abdominal pain, blood in stool, constipation, diarrhea, nausea and vomiting.  Genitourinary:  Negative for hematuria.   Skin: Negative.   Neurological:  Positive for dizziness and numbness. Negative for headaches and light-headedness.  Hematological:  Does not bruise/bleed easily.     PAST MEDICAL/SURGICAL HISTORY:  Past Medical History:  Diagnosis Date   Diabetes ([place])    Hypertension    No past surgical history on file.   SOCIAL HISTORY:  Social History   Socioeconomic History   Marital status: Married    Spouse name: Not on file   Number of children: Not on file   Years of education: Not on file   Highest education level: Not on file  Occupational History   Not on file  Tobacco Use   Smoking  status: Former    Packs/day: 0.50    Years: 3.00    Pack years: 1.50    Types: Cigarettes    Quit date: 04/08/1978    Years since quitting: 43.2   Smokeless tobacco: Never  Vaping Use   Vaping Use: Never used  Substance and Sexual Activity   Alcohol use: Never   Drug use: Never   Sexual activity: Not on file  Other  Topics Concern   Not on file  Social History Narrative   Not on file   Social Determinants of Health   Financial Resource Strain: Low Risk    Difficulty of Paying Living Expenses: Not hard at all  Food Insecurity: No Food Insecurity   Worried About Charity fundraiser in the Last Year: Never true   [place] in the Last Year: Never true  Transportation Needs: No Transportation Needs   Lack of Transportation (Medical): No   Lack of Transportation (Non-Medical): No  Physical Activity: Inactive   Days of Exercise per Week: 0 days   Minutes of Exercise per Session: 0 min  Stress: No Stress Concern Present   Feeling of Stress : Not at all  Social Connections: Moderately Integrated   Frequency of Communication with Friends and Family: More than three times a week   Frequency of Social Gatherings with Friends and Family: Three times a week   Attends Religious Services: 1 to 4 times per year   Active Member of Clubs or Organizations: No   Attends Archivist Meetings: Never   Marital Status: Married  Human resources officer Violence: Not At Risk   Fear of Current or Ex-Partner: No   Emotionally Abused: No   Physically Abused: No   Sexually Abused: No    FAMILY HISTORY:  Family History  Problem Relation Age of Onset   Dementia Mother    Cancer Paternal Uncle        lung    CURRENT MEDICATIONS:  Outpatient Encounter Medications as of 07/05/2021  Medication Sig   ACCU-CHEK AVIVA PLUS test strip TEST BLOOD SUGAR D   allopurinol (ZYLOPRIM) 300 MG tablet Take 300 mg by mouth daily.   aspirin 81 MG EC tablet Take by mouth.   atorvastatin (LIPITOR) 10 MG tablet Take 10 mg by mouth once a week.   clonazePAM (KLONOPIN) 0.5 MG tablet Take 0.5 mg by mouth 2 (two) times daily as needed.    cyclobenzaprine (FLEXERIL) 10 MG tablet Take 10 mg by mouth as needed.    DULoxetine (CYMBALTA) 60 MG capsule Take 1 capsule by mouth daily.   gabapentin (NEURONTIN) 300 MG capsule Take 300 mg  by mouth at bedtime.   glipiZIDE (GLUCOTROL XL) 10 MG 24 hr tablet Take 10 mg by mouth daily.   hydroxyurea (HYDREA) 500 MG capsule Take 2 capsules (1,000 mg total) by mouth daily. Take 3 tablets Monday through Friday. Take 2 tablets on Saturday and Sunday (Patient taking differently: Take 1,000 mg by mouth daily. Take 2 tablets every day)   metFORMIN (GLUCOPHAGE-XR) 500 MG 24 hr tablet Take 500 mg by mouth daily.    metoprolol succinate (TOPROL-XL) 50 MG 24 hr tablet Take 50 mg by mouth daily.    potassium chloride SA (KLOR-CON) 20 MEQ tablet Take 1 tablet by mouth daily.   promethazine (PHENERGAN) 12.5 MG tablet Take 12.5 mg by mouth as needed.  (Patient not taking: Reported on 05/09/2021)   sildenafil (VIAGRA) 50 MG tablet Take 1 tablet (50 mg  total) by mouth daily as needed for erectile dysfunction.   traMADol (ULTRAM) 50 MG tablet Take 50 mg by mouth every 8 (eight) hours as needed. (Patient not taking: Reported on 05/09/2021)   No facility-administered encounter medications on file as of 07/05/2021.    ALLERGIES:  Allergies  Allergen Reactions   Ivp Dye  [Iodinated Diagnostic Agents]      PHYSICAL EXAM:  ECOG PERFORMANCE STATUS: 1 - Symptomatic but completely ambulatory  There were no vitals filed for this visit. There were no vitals filed for this visit. Physical Exam Constitutional:      Appearance: Normal appearance. He is obese.  HENT:     Head: Normocephalic and atraumatic.     Mouth/Throat:     Mouth: Mucous membranes are moist.  Eyes:     Extraocular Movements: Extraocular movements intact.     Pupils: Pupils are equal, round, and reactive to light.  Cardiovascular:     Rate and Rhythm: Normal rate and regular rhythm.     Pulses: Normal pulses.     Heart sounds: Normal heart sounds.  Pulmonary:     Effort: Pulmonary effort is normal.     Breath sounds: Normal breath sounds.  Abdominal:     General: Bowel sounds are normal.     Palpations: Abdomen is soft.      Tenderness: There is no abdominal tenderness.  Musculoskeletal:        General: No swelling.     Right lower leg: No edema.     Left lower leg: No edema.  Lymphadenopathy:     Cervical: No cervical adenopathy.  Skin:    General: Skin is warm and dry.  Neurological:     General: No focal deficit present.     Mental Status: He is alert and oriented to person, place, and time.  Psychiatric:        Mood and Affect: Mood normal.        Behavior: Behavior normal.     LABORATORY DATA:  I have reviewed the labs as listed.  CBC    Component Value Date/Time   WBC 6.0 05/09/2021 0911   RBC 2.73 (L) 05/09/2021 0911   HGB 9.1 (L) 05/09/2021 0911   HCT 30.9 (L) 05/09/2021 0911   PLT 581 (H) 05/09/2021 0911   MCV 113.2 (H) 05/09/2021 0911   MCH 33.3 05/09/2021 0911   MCHC 29.4 (L) 05/09/2021 0911   RDW 20.4 (H) 05/09/2021 0911   LYMPHSABS 1.0 05/09/2021 0911   MONOABS 0.5 05/09/2021 0911   EOSABS 0.0 05/09/2021 0911   BASOSABS 0.1 05/09/2021 0911   CMP Latest Ref Rng & Units 04/23/2021 11/29/2020 09/19/2020  Glucose 70 - 99 mg/dL 92 220(H) 189(H)  BUN 8 - 23 mg/dL 19 17 12   Creatinine 0.61 - 1.24 mg/dL 1.19 1.22 1.19  Sodium 135 - 145 mmol/L 134(L) 134(L) 139  Potassium 3.5 - 5.1 mmol/L 4.2 4.1 4.1  Chloride 98 - 111 mmol/L 101 103 104  CO2 22 - 32 mmol/L 25 21(L) 25  Calcium 8.9 - 10.3 mg/dL 8.9 9.6 9.3  Total Protein 6.5 - 8.1 g/dL 6.2(L) 6.8 6.6  Total Bilirubin 0.3 - 1.2 mg/dL 0.8 0.9 0.9  Alkaline Phos 38 - 126 U/L 43 38 32(L)  AST 15 - 41 U/L 18 21 17   ALT 0 - 44 U/L 29 34 26    DIAGNOSTIC IMAGING:  I have independently reviewed the relevant imaging and discussed with the patient.  ASSESSMENT & PLAN: 1.  Essential thrombocytosis, CALR+: -High risk disease with history of thrombosis (left leg DVT) and age > 19 - Bone marrow biopsy 12/10/2017 by Dr. Delton Coombes at [facility] in [place], [place] cytometty for leukemia/lymphoma was non-diagnostic: "No  significant diagnostic immunophenotypic abnormality detected." Pathology review showed 60% cellularity with significant megakaryocytic hyperplasia with clusters of megakaryocytes; reticulin stain shows slight (Grade 1) reticulin fibrosis, some centered around areas of megakaryocyte clusters Cytogenic analysis showed normal male karyotype 46,XY[20] Noted to be at risk for progression to post essential thrombocythemia related myelofibrosis, but peripheral blood did not show other features of myelofibrosis, such as leukoerythroblastic blood or atypical megakaryocyte morphology -No aquagenic pruritus or erythromelalgias. - He has been having some issues with dizziness and loss of balance, which may or may not be vasomotor symptoms related to elevated platelets; he has an upcoming appointment with neurology to investigate other causes. -Hydroxyurea started in December 2018 (current dose Hydrea 2 tablets daily; was decreased due to anemia and fatigue).  He is on aspirin 81 mg daily. - He is tolerating Hydrea well without side effects - CT abdomen and pelvis on 09/29/2017 showed splenomegaly with spleen 14.5 cm - No palpable adenopathy or splenomegaly on exam -Most recent labs (07/05/2021): Platelets 634, Hgb 10.2 with MCV 103.4 - PLAN: Continue current dose of Hydrea (1000 mg daily).  Ideally, we will be able to increase dose of Hydrea to achieve platelets < 500.  However, due to persistent anemia in the setting of suspected blood loss, we will allow time for recovery of blood counts before increasing Hydrea back to previous dose.  Continue aspirin 81 mg daily.  2.  Macrocytic anemia -Labs obtained at [facility] on 04/08/2021 show that the patient has had onset of anemia between December 2021 (Hgb 12.0) and April 2022 (Hgb 8.3) - Macrocytosis due to hydroxyurea, but suspect that anemia itself is due to chronic GI blood loss. 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  (He has been referred for further follow-up with gastroenterologist Dr. Jenetta Downer.) - Laboratory work-up otherwise revealed LDH was slightly elevated at 245.  Erythropoietin was elevated at 67.2.  Reticulocytes 2.9%, with reticulocyte index < 2 indicating hypoproliferation.  Peripheral smear showed macrocytic anemia, neutrophilic left shift, and thrombocytosis.  SPEP/IFE were normal.  Hemolysis labs unremarkable. - Differential diagnosis favors multifactorial anemia due to chronic GI blood loss, myelosuppressive effects of hydroxyurea, and anemia related to chronic disease; unable to rule out anemia secondary to evolving post-ET MF or early MDS at this time, but will consider bone marrow biopsy if any significant deviations from baseline -Most recent labs (07/05/2021): Hgb 10.2 with MCV 103.4, ferritin 318, serum iron 98, 28% iron saturation - PLAN: Continue Hydrea at decreased dose (1000 mg daily).  Repeat CBC and iron studies in 2 months.  3. Personal history of colon cancer - Patient reports history of stage II colon cancer in ~ 2004 - Underwent right hemicolectomy and chemotherapy with Leukovorin and 5FU - Most recent colonoscopy in August 2021 was normal, due for repeat colonoscopy in 5 years   4. Personal history of melanoma - Isolated lesion of right leg melanoma s/p surgical excision "many years ago"    PLAN SUMMARY & DISPOSITION: -Labs and RTC in 2  months  All questions were answered. The patient knows to call the clinic with any problems, questions or concerns.  Medical decision making: Moderate  Time spent on visit: I spent 25 minutes counseling the patient face to face. The total time spent in the appointment was 40 minutes and more than 50%  was on counseling.   Harriett Rush, PA-C  07/05/2021 6:01 PM

## 2021-07-05 ENCOUNTER — Encounter (HOSPITAL_COMMUNITY): Payer: Self-pay | Admitting: Physician Assistant

## 2021-07-05 ENCOUNTER — Other Ambulatory Visit: Payer: Self-pay

## 2021-07-05 ENCOUNTER — Inpatient Hospital Stay (HOSPITAL_COMMUNITY): Payer: Medicare Other | Attending: Physician Assistant | Admitting: Physician Assistant

## 2021-07-05 ENCOUNTER — Inpatient Hospital Stay (HOSPITAL_COMMUNITY): Payer: Medicare Other

## 2021-07-05 ENCOUNTER — Other Ambulatory Visit (HOSPITAL_COMMUNITY): Payer: Self-pay | Admitting: Physician Assistant

## 2021-07-05 VITALS — BP 125/70 | HR 64 | Temp 98.2°F | Resp 18 | Wt 227.0 lb

## 2021-07-05 DIAGNOSIS — Z85038 Personal history of other malignant neoplasm of large intestine: Secondary | ICD-10-CM | POA: Insufficient documentation

## 2021-07-05 DIAGNOSIS — D539 Nutritional anemia, unspecified: Secondary | ICD-10-CM | POA: Diagnosis not present

## 2021-07-05 DIAGNOSIS — D649 Anemia, unspecified: Secondary | ICD-10-CM

## 2021-07-05 DIAGNOSIS — R2681 Unsteadiness on feet: Secondary | ICD-10-CM | POA: Diagnosis not present

## 2021-07-05 DIAGNOSIS — S8292XD Unspecified fracture of left lower leg, subsequent encounter for closed fracture with routine healing: Secondary | ICD-10-CM | POA: Diagnosis not present

## 2021-07-05 DIAGNOSIS — Z79899 Other long term (current) drug therapy: Secondary | ICD-10-CM | POA: Insufficient documentation

## 2021-07-05 DIAGNOSIS — M76899 Other specified enthesopathies of unspecified lower limb, excluding foot: Secondary | ICD-10-CM | POA: Diagnosis not present

## 2021-07-05 DIAGNOSIS — D473 Essential (hemorrhagic) thrombocythemia: Secondary | ICD-10-CM | POA: Diagnosis not present

## 2021-07-05 DIAGNOSIS — Z85820 Personal history of malignant melanoma of skin: Secondary | ICD-10-CM | POA: Insufficient documentation

## 2021-07-05 LAB — CBC WITH DIFFERENTIAL/PLATELET
Abs Immature Granulocytes: 0.6 10*3/uL — ABNORMAL HIGH (ref 0.00–0.07)
Band Neutrophils: 3 %
Basophils Absolute: 0.1 10*3/uL (ref 0.0–0.1)
Basophils Relative: 1 %
Eosinophils Absolute: 0 10*3/uL (ref 0.0–0.5)
Eosinophils Relative: 0 %
HCT: 33.3 % — ABNORMAL LOW (ref 39.0–52.0)
Hemoglobin: 10.2 g/dL — ABNORMAL LOW (ref 13.0–17.0)
Lymphocytes Relative: 12 %
Lymphs Abs: 0.9 10*3/uL (ref 0.7–4.0)
MCH: 31.7 pg (ref 26.0–34.0)
MCHC: 30.6 g/dL (ref 30.0–36.0)
MCV: 103.4 fL — ABNORMAL HIGH (ref 80.0–100.0)
Metamyelocytes Relative: 5 %
Monocytes Absolute: 0.5 10*3/uL (ref 0.1–1.0)
Monocytes Relative: 6 %
Myelocytes: 2 %
Neutro Abs: 5.8 10*3/uL (ref 1.7–7.7)
Neutrophils Relative %: 71 %
Platelets: 634 10*3/uL — ABNORMAL HIGH (ref 150–400)
RBC: 3.22 MIL/uL — ABNORMAL LOW (ref 4.22–5.81)
RDW: 18.1 % — ABNORMAL HIGH (ref 11.5–15.5)
WBC: 7.8 10*3/uL (ref 4.0–10.5)
nRBC: 0.5 % — ABNORMAL HIGH (ref 0.0–0.2)
nRBC: 1 /100 WBC — ABNORMAL HIGH

## 2021-07-05 LAB — IRON AND TIBC
Iron: 98 ug/dL (ref 45–182)
Saturation Ratios: 28 % (ref 17.9–39.5)
TIBC: 353 ug/dL (ref 250–450)
UIBC: 255 ug/dL

## 2021-07-05 LAB — FERRITIN: Ferritin: 318 ng/mL (ref 24–336)

## 2021-07-05 NOTE — Patient Instructions (Signed)
[place] at [facility] Discharge Instructions  You were seen today by Tarri Abernethy PA-C for your anemia and essential thrombocytosis (elevated platelets).  Your blood counts are improved, but are not yet back to normal.  This is likely due to bleeding from your GI tract, although you may also have some decreased red blood cell production from your Hydrea.  Because of this, we will keep you at the same dose of Hydrea (1000 mg daily) until your blood counts have improved.  We will also check your iron levels to see if you have any iron deficiency.  If your iron levels are low, we will prescribe IV iron to help improve your blood counts.  Additionally, it is very important that you see the gastroenterologist as previously scheduled.  Your platelets are elevated at greater than 600.  Ideally, they would be within the 400-500 range.  However, we are hesitant to increase your dose of Hydrea due to your anemia.  LABS: Repeat labs today.  Return again in 2 months for labs before your next appointment.  FOLLOW-UP APPOINTMENT: Office visit in 2 months   Thank you for choosing [place] at [facility] to provide your oncology and hematology care.  To afford each patient quality time with our provider, please arrive at least 15 minutes before your scheduled appointment time.   If you have a lab appointment with the [facility] please come in thru the Main Entrance and check in at the main information desk.  You need to re-schedule your appointment should you arrive 10 or more minutes late.  We strive to give you quality time with our providers, and arriving late affects you and other patients whose appointments are after yours.  Also, if you no show three or more times for appointments you may be dismissed from the clinic at the providers discretion.     Again, thank you for choosing [facility].  Our hope is that these requests will  decrease the amount of time that you wait before being seen by our physicians.       _____________________________________________________________  Should you have questions after your visit to [facility], please contact our office at [phone] and follow the prompts.  Our office hours are 8:00 a.m. and 4:30 p.m. Monday - Friday.  Please note that voicemails left after 4:00 p.m. may not be returned until the following business day.  We are closed weekends and major holidays.  You do have access to a nurse 24-7, just call the main number to the clinic [phone] and do not press any options, hold on the line and a nurse will answer the phone.    For prescription refill requests, have your pharmacy contact our office and allow 72 hours.    Due to Covid, you will need to wear a mask upon entering the hospital. If you do not have a mask, a mask will be given to you at the Main Entrance upon arrival. For doctor visits, patients may have 1 support person age 36 or older with them. For treatment visits, patients can not have anyone with them due to social distancing guidelines and our immunocompromised population.

## 2021-07-09 DIAGNOSIS — M25562 Pain in left knee: Secondary | ICD-10-CM | POA: Diagnosis not present

## 2021-07-09 DIAGNOSIS — S82892A Other fracture of left lower leg, initial encounter for closed fracture: Secondary | ICD-10-CM | POA: Diagnosis not present

## 2021-07-11 DIAGNOSIS — E119 Type 2 diabetes mellitus without complications: Secondary | ICD-10-CM | POA: Diagnosis not present

## 2021-07-11 DIAGNOSIS — Z961 Presence of intraocular lens: Secondary | ICD-10-CM | POA: Diagnosis not present

## 2021-07-11 DIAGNOSIS — H34832 Tributary (branch) retinal vein occlusion, left eye, with macular edema: Secondary | ICD-10-CM | POA: Diagnosis not present

## 2021-07-11 DIAGNOSIS — S8292XD Unspecified fracture of left lower leg, subsequent encounter for closed fracture with routine healing: Secondary | ICD-10-CM | POA: Diagnosis not present

## 2021-07-11 DIAGNOSIS — M76899 Other specified enthesopathies of unspecified lower limb, excluding foot: Secondary | ICD-10-CM | POA: Diagnosis not present

## 2021-07-11 DIAGNOSIS — H26492 Other secondary cataract, left eye: Secondary | ICD-10-CM | POA: Diagnosis not present

## 2021-07-11 DIAGNOSIS — H40013 Open angle with borderline findings, low risk, bilateral: Secondary | ICD-10-CM | POA: Diagnosis not present

## 2021-07-11 DIAGNOSIS — R2681 Unsteadiness on feet: Secondary | ICD-10-CM | POA: Diagnosis not present

## 2021-07-13 DIAGNOSIS — S8292XD Unspecified fracture of left lower leg, subsequent encounter for closed fracture with routine healing: Secondary | ICD-10-CM | POA: Diagnosis not present

## 2021-07-13 DIAGNOSIS — R2681 Unsteadiness on feet: Secondary | ICD-10-CM | POA: Diagnosis not present

## 2021-07-13 DIAGNOSIS — M76899 Other specified enthesopathies of unspecified lower limb, excluding foot: Secondary | ICD-10-CM | POA: Diagnosis not present

## 2021-07-17 DIAGNOSIS — S8292XD Unspecified fracture of left lower leg, subsequent encounter for closed fracture with routine healing: Secondary | ICD-10-CM | POA: Diagnosis not present

## 2021-07-17 DIAGNOSIS — R2681 Unsteadiness on feet: Secondary | ICD-10-CM | POA: Diagnosis not present

## 2021-07-17 DIAGNOSIS — M76899 Other specified enthesopathies of unspecified lower limb, excluding foot: Secondary | ICD-10-CM | POA: Diagnosis not present

## 2021-07-20 DIAGNOSIS — M25562 Pain in left knee: Secondary | ICD-10-CM | POA: Diagnosis not present

## 2021-07-20 DIAGNOSIS — R2681 Unsteadiness on feet: Secondary | ICD-10-CM | POA: Diagnosis not present

## 2021-07-20 DIAGNOSIS — S8292XD Unspecified fracture of left lower leg, subsequent encounter for closed fracture with routine healing: Secondary | ICD-10-CM | POA: Diagnosis not present

## 2021-07-20 DIAGNOSIS — M76899 Other specified enthesopathies of unspecified lower limb, excluding foot: Secondary | ICD-10-CM | POA: Diagnosis not present

## 2021-07-20 DIAGNOSIS — R29898 Other symptoms and signs involving the musculoskeletal system: Secondary | ICD-10-CM | POA: Diagnosis not present

## 2021-07-20 DIAGNOSIS — M25662 Stiffness of left knee, not elsewhere classified: Secondary | ICD-10-CM | POA: Diagnosis not present

## 2021-07-24 DIAGNOSIS — R29898 Other symptoms and signs involving the musculoskeletal system: Secondary | ICD-10-CM | POA: Diagnosis not present

## 2021-07-24 DIAGNOSIS — S8292XD Unspecified fracture of left lower leg, subsequent encounter for closed fracture with routine healing: Secondary | ICD-10-CM | POA: Diagnosis not present

## 2021-07-24 DIAGNOSIS — M25662 Stiffness of left knee, not elsewhere classified: Secondary | ICD-10-CM | POA: Diagnosis not present

## 2021-07-24 DIAGNOSIS — R2681 Unsteadiness on feet: Secondary | ICD-10-CM | POA: Diagnosis not present

## 2021-07-24 DIAGNOSIS — M25562 Pain in left knee: Secondary | ICD-10-CM | POA: Diagnosis not present

## 2021-07-24 DIAGNOSIS — M76899 Other specified enthesopathies of unspecified lower limb, excluding foot: Secondary | ICD-10-CM | POA: Diagnosis not present

## 2021-07-26 DIAGNOSIS — R29898 Other symptoms and signs involving the musculoskeletal system: Secondary | ICD-10-CM | POA: Diagnosis not present

## 2021-07-26 DIAGNOSIS — M25562 Pain in left knee: Secondary | ICD-10-CM | POA: Diagnosis not present

## 2021-07-26 DIAGNOSIS — R2681 Unsteadiness on feet: Secondary | ICD-10-CM | POA: Diagnosis not present

## 2021-07-26 DIAGNOSIS — M25662 Stiffness of left knee, not elsewhere classified: Secondary | ICD-10-CM | POA: Diagnosis not present

## 2021-07-26 DIAGNOSIS — S8292XD Unspecified fracture of left lower leg, subsequent encounter for closed fracture with routine healing: Secondary | ICD-10-CM | POA: Diagnosis not present

## 2021-07-26 DIAGNOSIS — M76899 Other specified enthesopathies of unspecified lower limb, excluding foot: Secondary | ICD-10-CM | POA: Diagnosis not present

## 2021-08-01 DIAGNOSIS — Z1329 Encounter for screening for other suspected endocrine disorder: Secondary | ICD-10-CM | POA: Diagnosis not present

## 2021-08-01 DIAGNOSIS — E782 Mixed hyperlipidemia: Secondary | ICD-10-CM | POA: Diagnosis not present

## 2021-08-01 DIAGNOSIS — D529 Folate deficiency anemia, unspecified: Secondary | ICD-10-CM | POA: Diagnosis not present

## 2021-08-01 DIAGNOSIS — D649 Anemia, unspecified: Secondary | ICD-10-CM | POA: Diagnosis not present

## 2021-08-01 DIAGNOSIS — D473 Essential (hemorrhagic) thrombocythemia: Secondary | ICD-10-CM | POA: Diagnosis not present

## 2021-08-01 DIAGNOSIS — E1165 Type 2 diabetes mellitus with hyperglycemia: Secondary | ICD-10-CM | POA: Diagnosis not present

## 2021-08-01 DIAGNOSIS — Z0001 Encounter for general adult medical examination with abnormal findings: Secondary | ICD-10-CM | POA: Diagnosis not present

## 2021-08-01 DIAGNOSIS — E7849 Other hyperlipidemia: Secondary | ICD-10-CM | POA: Diagnosis not present

## 2021-08-01 DIAGNOSIS — D519 Vitamin B12 deficiency anemia, unspecified: Secondary | ICD-10-CM | POA: Diagnosis not present

## 2021-08-01 DIAGNOSIS — N189 Chronic kidney disease, unspecified: Secondary | ICD-10-CM | POA: Diagnosis not present

## 2021-08-01 DIAGNOSIS — Z1159 Encounter for screening for other viral diseases: Secondary | ICD-10-CM | POA: Diagnosis not present

## 2021-08-01 DIAGNOSIS — I10 Essential (primary) hypertension: Secondary | ICD-10-CM | POA: Diagnosis not present

## 2021-08-01 DIAGNOSIS — E7801 Familial hypercholesterolemia: Secondary | ICD-10-CM | POA: Diagnosis not present

## 2021-08-04 DIAGNOSIS — Z23 Encounter for immunization: Secondary | ICD-10-CM | POA: Diagnosis not present

## 2021-08-06 DIAGNOSIS — E7849 Other hyperlipidemia: Secondary | ICD-10-CM | POA: Diagnosis not present

## 2021-08-06 DIAGNOSIS — F331 Major depressive disorder, recurrent, moderate: Secondary | ICD-10-CM | POA: Diagnosis not present

## 2021-08-06 DIAGNOSIS — E1142 Type 2 diabetes mellitus with diabetic polyneuropathy: Secondary | ICD-10-CM | POA: Diagnosis not present

## 2021-08-06 DIAGNOSIS — D473 Essential (hemorrhagic) thrombocythemia: Secondary | ICD-10-CM | POA: Diagnosis not present

## 2021-08-06 DIAGNOSIS — I10 Essential (primary) hypertension: Secondary | ICD-10-CM | POA: Diagnosis not present

## 2021-08-06 DIAGNOSIS — M109 Gout, unspecified: Secondary | ICD-10-CM | POA: Diagnosis not present

## 2021-08-06 DIAGNOSIS — G252 Other specified forms of tremor: Secondary | ICD-10-CM | POA: Diagnosis not present

## 2021-08-06 DIAGNOSIS — Z6834 Body mass index (BMI) 34.0-34.9, adult: Secondary | ICD-10-CM | POA: Diagnosis not present

## 2021-08-15 ENCOUNTER — Ambulatory Visit: Payer: Medicare Other | Admitting: Neurology

## 2021-09-04 NOTE — Progress Notes (Signed)
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 male returns for routine follow-up of his essential thrombocytosis and macrocytic anemia.  He was last seen by Tarri Abernethy PA-C on 07/05/2021.  At today's visit, he reports feeling fair.  No recent hospitalizations, surgeries, or changes in baseline health status.  He reports that he has recovered his lower extremity strength following his ankle fracture last year, but continues to have an ataxic gait.  He is scheduled to see neurology later this year.  He reports some continued moderate fatigue, which has been his baseline for the past several months.  Despite EGD on 05/25/2021 showing signs of upper GI blood loss, he denies any obvious signs of gastrointestinal hemorrhage such as hematemesis, hematochezia, or melena.  He denies any chest pain, dyspnea on exertion, palpitations, or syncope.  He does not experience any erythromelalgia or aquagenic pruritus from his thrombocytosis.  However, he does have some possible vasomotor symptoms such as intermittent blurry vision.  No tinnitus, headache, or strokelike symptoms.  No recurrent signs of DVT or PE.  No B symptoms such as fever, chills, night sweats, unintentional weight loss.  He is tolerating Hydrea well.  CBC shows expected macrocytosis.  Patient denies cutaneous ulcers and nonhealing skin wounds.  No complaints of oral ulcers.  Denies gastrointestinal symptoms such as gastritis, nausea, vomiting.  He does have some intermittent diarrhea.  He has 75% energy and 100% appetite. He endorses that he is maintaining a stable  weight.   REVIEW OF SYSTEMS:  Review of Systems  Constitutional:  Positive for fatigue. Negative for appetite change, chills, diaphoresis, fever and unexpected weight change.  HENT:   Negative for lump/mass and nosebleeds.   Eyes:  Positive for eye problems (Intermittent blurry vision).  Respiratory:  Negative for cough, hemoptysis and shortness of breath.   Cardiovascular:  Negative for chest pain, leg swelling and palpitations.  Gastrointestinal:  Positive for diarrhea. Negative for abdominal pain, blood in stool, constipation, nausea and vomiting.  Genitourinary:  Negative for hematuria.   Musculoskeletal:  Positive for gait problem.  Skin: Negative.   Neurological:  Positive for gait problem. Negative for dizziness, headaches and light-headedness.  Hematological:  Does not bruise/bleed easily.     PAST MEDICAL/SURGICAL HISTORY:  Past Medical History:  Diagnosis Date   Diabetes ([place])    Hypertension    No past surgical history on file.   SOCIAL HISTORY:  Social History   Socioeconomic History   Marital status: Married    Spouse name: Not on file   Number of children: Not on file   Years of education: Not on file   Highest education level: Not on file  Occupational History   Not on file  Tobacco Use   Smoking status: Former    Packs/day: 0.50    Years: 3.00    Pack years: 1.50    Types: Cigarettes    Quit date: 04/08/1978    Years since quitting: 43.4   Smokeless tobacco: Never  Vaping Use   Vaping Use: Never used  Substance and Sexual Activity   Alcohol use: Never   Drug use:  Never   Sexual activity: Not on file  Other Topics Concern   Not on file  Social History Narrative   Not on file   Social Determinants of Health   Financial Resource Strain: Low Risk    Difficulty of Paying Living Expenses: Not hard at all  Food Insecurity: No Food Insecurity   Worried About [place] in the Last Year: Never true   [place] in the Last Year:  Never true  Transportation Needs: No Transportation Needs   Lack of Transportation (Medical): No   Lack of Transportation (Non-Medical): No  Physical Activity: Inactive   Days of Exercise per Week: 0 days   Minutes of Exercise per Session: 0 min  Stress: No Stress Concern Present   Feeling of Stress : Not at all  Social Connections: Moderately Integrated   Frequency of Communication with Friends and Family: More than three times a week   Frequency of Social Gatherings with Friends and Family: Three times a week   Attends Religious Services: 1 to 4 times per year   Active Member of Clubs or Organizations: No   Attends Archivist Meetings: Never   Marital Status: Married  Human resources officer Violence: Not At Risk   Fear of Current or Ex-Partner: No   Emotionally Abused: No   Physically Abused: No   Sexually Abused: No    FAMILY HISTORY:  Family History  Problem Relation Age of Onset   Dementia Mother    Cancer Paternal Uncle        lung    CURRENT MEDICATIONS:  Outpatient Encounter Medications as of 09/05/2021  Medication Sig   ACCU-CHEK AVIVA PLUS test strip TEST BLOOD SUGAR D   allopurinol (ZYLOPRIM) 300 MG tablet Take 300 mg by mouth daily.   aspirin 81 MG EC tablet Take by mouth.   atorvastatin (LIPITOR) 10 MG tablet Take 10 mg by mouth once a week.   clonazePAM (KLONOPIN) 0.5 MG tablet Take 0.5 mg by mouth 2 (two) times daily as needed.    DULoxetine (CYMBALTA) 60 MG capsule Take 1 capsule by mouth daily.   gabapentin (NEURONTIN) 300 MG capsule Take 300 mg by mouth at bedtime.   glipiZIDE (GLUCOTROL XL) 10 MG 24 hr tablet Take 10 mg by mouth daily.   hydroxyurea (HYDREA) 500 MG capsule Take 2 capsules (1,000 mg total) by mouth daily. Take 3 tablets Monday through Friday. Take 2 tablets on Saturday and Sunday (Patient taking differently: Take 1,000 mg by mouth daily. Take 2 tablets every day)   metFORMIN (GLUCOPHAGE-XR) 500 MG 24 hr tablet Take 500 mg by mouth daily.     metoprolol succinate (TOPROL-XL) 50 MG 24 hr tablet Take 50 mg by mouth daily.    potassium chloride SA (KLOR-CON) 20 MEQ tablet Take 1 tablet by mouth daily.   sildenafil (VIAGRA) 50 MG tablet Take 1 tablet (50 mg total) by mouth daily as needed for erectile dysfunction.   tiZANidine (ZANAFLEX) 4 MG tablet Take 4 mg by mouth at bedtime.   No facility-administered encounter medications on file as of 09/05/2021.    ALLERGIES:  Allergies  Allergen Reactions   Ivp Dye  [Iodinated Diagnostic Agents]      PHYSICAL EXAM:  ECOG PERFORMANCE STATUS: 1 - Symptomatic but completely ambulatory  There were no vitals filed for this visit. There were no vitals filed for this visit. Physical Exam Constitutional:      Appearance: Normal appearance. He is obese.  HENT:     Head: Normocephalic and atraumatic.     Mouth/Throat:     Mouth: Mucous membranes are moist.  Eyes:     Extraocular Movements: Extraocular movements intact.     Pupils: Pupils are equal, round, and reactive to light.  Cardiovascular:     Rate and Rhythm: Normal rate and regular rhythm.     Pulses: Normal pulses.     Heart sounds: Normal heart sounds.  Pulmonary:     Effort: Pulmonary effort is normal.     Breath sounds: Normal breath sounds.  Abdominal:     General: Bowel sounds are normal.     Palpations: Abdomen is soft.     Tenderness: There is no abdominal tenderness.  Musculoskeletal:        General: No swelling.     Right lower leg: No edema.     Left lower leg: No edema.  Lymphadenopathy:     Cervical: No cervical adenopathy.  Skin:    General: Skin is warm and dry.  Neurological:     General: No focal deficit present.     Mental Status: He is alert and oriented to person, place, and time.  Psychiatric:        Mood and Affect: Mood normal.        Behavior: Behavior normal.     LABORATORY DATA:  I have reviewed the labs as listed.  CBC    Component Value Date/Time   WBC 7.8 07/05/2021 0912    RBC 3.22 (L) 07/05/2021 0912   HGB 10.2 (L) 07/05/2021 0912   HCT 33.3 (L) 07/05/2021 0912   PLT 634 (H) 07/05/2021 0912   MCV 103.4 (H) 07/05/2021 0912   MCH 31.7 07/05/2021 0912   MCHC 30.6 07/05/2021 0912   RDW 18.1 (H) 07/05/2021 0912   LYMPHSABS 0.9 07/05/2021 0912   MONOABS 0.5 07/05/2021 0912   EOSABS 0.0 07/05/2021 0912   BASOSABS 0.1 07/05/2021 0912   CMP Latest Ref Rng & Units 04/23/2021 11/29/2020 09/19/2020  Glucose 70 - 99 mg/dL 92 220(H) 189(H)  BUN 8 - 23 mg/dL _0 Creatinine 0.61 - 1.24 mg/dL 1.19 1.22 1.19  Sodium 135 - 145 mmol/L 134(L) 134(L) 139  Potassium 3.5 - 5.1 mmol/L 4.2 4.1 4.1  Chloride 98 - 111 mmol/L 101 103 104  CO2 22 - 32 mmol/L 25 21(L) 25  Calcium 8.9 - 10.3 mg/dL 8.9 9.6 9.3  Total Protein 6.5 - 8.1 g/dL 6.2(L) 6.8 6.6  Total Bilirubin 0.3 - 1.2 mg/dL 0.8 0.9 0.9  Alkaline Phos 38 - 126 U/L 43 38 32(L)  AST 15 - 41 U/L _1 ALT 0 - 44 U/L 29 34 26    DIAGNOSTIC IMAGING:  I have independently reviewed the relevant imaging and discussed with the patient.  ASSESSMENT & PLAN: 1.  Essential thrombocytosis, CALR+: - High risk disease with history of thrombosis (left leg DVT) and age > 81 - Bone marrow biopsy 12/10/2017 by Dr. Delton Coombes at [facility] in [place], [place] for leukemia/lymphoma was non-diagnostic: "No significant diagnostic immunophenotypic abnormality detected." Pathology review showed 60% cellularity with significant megakaryocytic hyperplasia with clusters of megakaryocytes; reticulin stain shows slight (Grade 1) reticulin fibrosis, some centered around areas of megakaryocyte clusters Cytogenic analysis showed normal male karyotype 46,XY[20] Noted to be at risk for progression to post essential thrombocythemia related myelofibrosis, but peripheral blood did not show other features of myelofibrosis, such as leukoerythroblastic blood or atypical megakaryocyte  morphology - No aquagenic pruritus or  erythromelalgias.  Mild vasomotor symptoms such as intermittent blurry vision; no tinnitus, headache, strokelike symptoms - Hydroxyurea started in December 2018 (current dose Hydrea 2 tablets daily; was decreased due to anemia and fatigue).  He is on aspirin 81 mg daily. - He is tolerating Hydrea well without side effects  - CT abdomen and pelvis on 09/29/2017 showed splenomegaly with spleen 14.5 cm - No palpable adenopathy or splenomegaly on exam - Most recent labs (09/05/2021): Platelets 530 with Hgb 11.7 and MCV 101.4; LDH 228 - PLAN: We will slightly increase dose of Hydrea - 1,000 mg Tu/Th/Sa/Su and 1,500 mg on Mo/We/Fr.  Goal is platelets < 500, but patient still has some mild residual anemia after history of GI blood loss earlier this year, so we we will be cautious in increasing dose of Hydrea.  Continue aspirin 81 mg daily.  2.  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  (He has been referred for further follow-up with gastroenterologist Dr. Jenetta Downer - scheduled for initial appointment in October 2022) - Laboratory work-up otherwise revealed LDH was slightly elevated at 245.  Erythropoietin was elevated at 67.2.  Reticulocytes 2.9%, with reticulocyte index < 2 indicating hypoproliferation.  Peripheral smear showed macrocytic anemia, neutrophilic left shift, and thrombocytosis.  SPEP/IFE were normal.  Hemolysis labs  unremarkable. - Differential diagnosis favors multifactorial anemia due to chronic GI blood loss, myelosuppressive effects of hydroxyurea, and anemia related to chronic disease - Most recent labs (09/05/2021): Hgb improved at 11.7, MCV 101.4, ferritin 255, iron saturation 23% - Denies any gross signs or symptoms of blood loss such as hematemesis, hematochezia, or melena - Symptomatic with ongoing chronic fatigue - PLAN: No indication for IV iron at this time.  Recommend continued follow-up with gastroenterology for suspected GI blood loss.  Trial of increased dose of Hydrea as above, if tolerated by Hgb.  Repeat CBC and iron studies in 2 months.  3. Personal history of colon cancer - Patient reports history of stage II colon cancer in ~ 2004 - Underwent right hemicolectomy and chemotherapy with Leukovorin and 5FU - Most recent colonoscopy in August 2021 was normal, due for repeat colonoscopy in 5 years   4. 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 The patient knows to call the clinic with any problems, questions or concerns.  Medical decision making: Moderate  Time spent on visit: I spent 20 minutes counseling the patient face to face. The total time spent in the appointment was 30 minutes and more than 50% was on counseling.   Harriett Rush, PA-C  09/05/2021 11:18 AM

## 2021-09-05 ENCOUNTER — Other Ambulatory Visit: Payer: Self-pay

## 2021-09-05 ENCOUNTER — Inpatient Hospital Stay (HOSPITAL_COMMUNITY): Payer: Medicare Other | Attending: Physician Assistant | Admitting: Physician Assistant

## 2021-09-05 ENCOUNTER — Inpatient Hospital Stay (HOSPITAL_COMMUNITY): Payer: Medicare Other

## 2021-09-05 ENCOUNTER — Other Ambulatory Visit (HOSPITAL_COMMUNITY): Payer: Self-pay | Admitting: *Deleted

## 2021-09-05 VITALS — BP 117/73 | HR 64 | Temp 96.7°F | Resp 18 | Wt 233.4 lb

## 2021-09-05 DIAGNOSIS — Z961 Presence of intraocular lens: Secondary | ICD-10-CM | POA: Diagnosis not present

## 2021-09-05 DIAGNOSIS — D473 Essential (hemorrhagic) thrombocythemia: Secondary | ICD-10-CM | POA: Diagnosis not present

## 2021-09-05 DIAGNOSIS — D649 Anemia, unspecified: Secondary | ICD-10-CM

## 2021-09-05 DIAGNOSIS — E119 Type 2 diabetes mellitus without complications: Secondary | ICD-10-CM | POA: Diagnosis not present

## 2021-09-05 DIAGNOSIS — Z87891 Personal history of nicotine dependence: Secondary | ICD-10-CM | POA: Diagnosis not present

## 2021-09-05 DIAGNOSIS — H40013 Open angle with borderline findings, low risk, bilateral: Secondary | ICD-10-CM | POA: Diagnosis not present

## 2021-09-05 DIAGNOSIS — H34832 Tributary (branch) retinal vein occlusion, left eye, with macular edema: Secondary | ICD-10-CM | POA: Diagnosis not present

## 2021-09-05 DIAGNOSIS — D500 Iron deficiency anemia secondary to blood loss (chronic): Secondary | ICD-10-CM | POA: Diagnosis not present

## 2021-09-05 DIAGNOSIS — H26492 Other secondary cataract, left eye: Secondary | ICD-10-CM | POA: Diagnosis not present

## 2021-09-05 DIAGNOSIS — K922 Gastrointestinal hemorrhage, unspecified: Secondary | ICD-10-CM | POA: Insufficient documentation

## 2021-09-05 LAB — COMPREHENSIVE METABOLIC PANEL
ALT: 19 U/L (ref 0–44)
AST: 12 U/L — ABNORMAL LOW (ref 15–41)
Albumin: 4.3 g/dL (ref 3.5–5.0)
Alkaline Phosphatase: 44 U/L (ref 38–126)
Anion gap: 9 (ref 5–15)
BUN: 23 mg/dL (ref 8–23)
CO2: 25 mmol/L (ref 22–32)
Calcium: 9.2 mg/dL (ref 8.9–10.3)
Chloride: 104 mmol/L (ref 98–111)
Creatinine, Ser: 1.17 mg/dL (ref 0.61–1.24)
GFR, Estimated: >60 mL/min (ref >60)
Glucose, Bld: 153 mg/dL — ABNORMAL HIGH (ref 70–99)
Potassium: 4.4 mmol/L (ref 3.5–5.1)
Sodium: 138 mmol/L (ref 135–145)
Total Bilirubin: 0.8 mg/dL (ref 0.3–1.2)
Total Protein: 6.5 g/dL (ref 6.5–8.1)

## 2021-09-05 LAB — IRON AND TIBC
Iron: 93 ug/dL (ref 45–182)
Saturation Ratios: 23 % (ref 17.9–39.5)
TIBC: 400 ug/dL (ref 250–450)
UIBC: 307 ug/dL

## 2021-09-05 LAB — CBC WITH DIFFERENTIAL/PLATELET
Abs Immature Granulocytes: 0.55 10*3/uL — ABNORMAL HIGH (ref 0.00–0.07)
Basophils Absolute: 0.1 10*3/uL (ref 0.0–0.1)
Basophils Relative: 2 %
Eosinophils Absolute: 0.1 10*3/uL (ref 0.0–0.5)
Eosinophils Relative: 1 %
HCT: 37.2 % — ABNORMAL LOW (ref 39.0–52.0)
Hemoglobin: 11.7 g/dL — ABNORMAL LOW (ref 13.0–17.0)
Immature Granulocytes: 7 %
Lymphocytes Relative: 18 %
Lymphs Abs: 1.4 10*3/uL (ref 0.7–4.0)
MCH: 31.9 pg (ref 26.0–34.0)
MCHC: 31.5 g/dL (ref 30.0–36.0)
MCV: 101.4 fL — ABNORMAL HIGH (ref 80.0–100.0)
Monocytes Absolute: 1.1 10*3/uL — ABNORMAL HIGH (ref 0.1–1.0)
Monocytes Relative: 14 %
Neutro Abs: 4.7 10*3/uL (ref 1.7–7.7)
Neutrophils Relative %: 58 %
Platelets: 530 10*3/uL — ABNORMAL HIGH (ref 150–400)
RBC: 3.67 MIL/uL — ABNORMAL LOW (ref 4.22–5.81)
RDW: 19.3 % — ABNORMAL HIGH (ref 11.5–15.5)
WBC: 8 10*3/uL (ref 4.0–10.5)
nRBC: 0.3 % — ABNORMAL HIGH (ref 0.0–0.2)

## 2021-09-05 LAB — LACTATE DEHYDROGENASE: LDH: 228 U/L — ABNORMAL HIGH (ref 98–192)

## 2021-09-05 LAB — FERRITIN: Ferritin: 255 ng/mL (ref 24–336)

## 2021-09-05 MED ORDER — HYDROXYUREA 500 MG PO CAPS: ORAL_CAPSULE | ORAL | 3 refills | Status: DC

## 2021-09-05 MED ORDER — HYDROXYUREA 500 MG PO CAPS: ORAL_CAPSULE | ORAL | 5 refills | Status: DC

## 2021-09-05 NOTE — Patient Instructions (Signed)
[place] at [facility] Discharge Instructions  You were seen today by Tarri Abernethy PA-C for your elevated platelets (essential thrombocytosis) and anemia.  Your platelets are improved (platelets today 530), but are not yet at goal.  We will try slightly increasing your dose of hydroxyurea, as described below.  Your hemoglobin is also improved, which shows that your anemia is slowly resolving.  His anemia was suspected to be due to GI blood loss, and we recommend that you continue to follow-up with gastroenterology as scheduled with Dr. Jenetta Downer in October 2022.  LABS: Return in 2 months for repeat labs  OTHER TESTS: No other tests at this time  MEDICATIONS: INCREASE dose of Hydrea - take 1000 mg (2 tablets) on Tuesday/Thursday/Saturday/Sunday; take 1500 mg (3 tablets) on Monday/Wednesday/Friday.  If you notice that this increased dose is causing severe fatigue, please call our office and we will discuss changing your dose.  Otherwise, we will adjust your dose further at your follow-up visit in 2 months.  FOLLOW-UP APPOINTMENT: Office visit in 2 months (same day as labs)   Thank you for choosing [place] at [facility] to provide your oncology and hematology care.  To afford each patient quality time with our provider, please arrive at least 15 minutes before your scheduled appointment time.   If you have a lab appointment with the [facility] please come in thru the Main Entrance and check in at the main information desk.  You need to re-schedule your appointment should you arrive 10 or more minutes late.  We strive to give you quality time with our providers, and arriving late affects you and other patients whose appointments are after yours.  Also, if you no show three or more times for appointments you may be dismissed from the clinic at the providers discretion.     Again, thank you for choosing [facility].  Our hope  is that these requests will decrease the amount of time that you wait before being seen by our physicians.       _____________________________________________________________  Should you have questions after your visit to [facility], please contact our office at [phone] and follow the prompts.  Our office hours are 8:00 a.m. and 4:30 p.m. Monday - Friday.  Please note that voicemails left after 4:00 p.m. may not be returned until the following business day.  We are closed weekends and major holidays.  You do have access to a nurse 24-7, just call the main number to the clinic [phone] and do not press any options, hold on the line and a nurse will answer the phone.    For prescription refill requests, have your pharmacy contact our office and allow 72 hours.    Due to Covid, you will need to wear a mask upon entering the hospital. If you do not have a mask, a mask will be given to you at the Main Entrance upon arrival. For doctor visits, patients may have 1 support person age 56 or older with them. For treatment visits, patients can not have anyone with them due to social distancing guidelines and our immunocompromised population.

## 2021-09-07 ENCOUNTER — Ambulatory Visit: Payer: Medicare Other | Admitting: Neurology

## 2021-10-08 ENCOUNTER — Encounter (INDEPENDENT_AMBULATORY_CARE_PROVIDER_SITE_OTHER): Payer: Self-pay | Admitting: Gastroenterology

## 2021-10-08 ENCOUNTER — Ambulatory Visit (INDEPENDENT_AMBULATORY_CARE_PROVIDER_SITE_OTHER): Payer: Medicare Other | Admitting: Gastroenterology

## 2021-10-08 ENCOUNTER — Other Ambulatory Visit: Payer: Self-pay

## 2021-10-08 VITALS — BP 130/79 | HR 60 | Temp 97.9°F | Ht 70.0 in | Wt 236.0 lb

## 2021-10-08 DIAGNOSIS — D649 Anemia, unspecified: Secondary | ICD-10-CM | POA: Diagnosis not present

## 2021-10-08 DIAGNOSIS — K219 Gastro-esophageal reflux disease without esophagitis: Secondary | ICD-10-CM | POA: Insufficient documentation

## 2021-10-08 MED ORDER — OMEPRAZOLE 40 MG PO CPDR: 40.0000 mg | DELAYED_RELEASE_CAPSULE | Freq: Every day | ORAL | 3 refills | Status: DC

## 2021-10-08 NOTE — Progress Notes (Signed)
Referring Provider: Caryl Bis, MD Primary Care Physician:  Caryl Bis, MD Primary GI Physician: newly established, castaneda  Chief Complaint  Patient presents with   Follow-up    Follow up on endoscopy done at [facility] surgical center. States he was having blood in stools and had colonoscopy about 2 years ago at [facility] surgical center. Has been taking some of wife's omeprazole and it is helping cough that he was told was related to reflux.    HPI:   Justin Berger is a 69 y.o. male with past medical history of colon cancer s/p R colectomy (2003), GAD, HTN, atherosclerosis, AAA w/o rupture, essential thrombocytosis.   Patient presenting today at the request of Dr. Gar Ponto to establish GI care for ongoing anemia.   Patient had EGD in May 2022 after having anemia and positive FOBT. He denied any melena at that time and endorsed some scant blood on toilet paper, occasionally. 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 He denies any history of liver disease or cirrhosis.   He continues to have anemia. He is followed by hematology for essential thrombocytosis which he is on one baby aspirin a day for. States that his hgb continues to fluctuate up and down and hematology is unsure why. He denies any blood in stools or melena, no abdominal pain, nausea, vomiting, early satiety or weight loss. He denies fatigue, dizziness or shortness of breath. He is not currently on iron pills. He reports that he was previously taking a lot of advil prior to EGD in May, however, he stopped NSAIDs after EGD.  Last hgb 11.7 (09/05/21) and iron studies WNL. B12 281 04/20/28/ folate 9.6. He is not currently on any iron  supplements. He is on one 81 mg asa per day.   He denies constipation, some diarrhea from his hydroxyurea and metformin. He reports some days stools are solid, however, some days he will have 3-4 episodes of diarrhea, will occasionally take imodium, however, this will often cause him to have constipation.   He states he previously was having a cough, he would notice it more when he got anxious or upset, states he started taking some of his wife's omeprazole for about 1 month and his cough resolved. He reports occasional hoarse voice but no sore throat, acid reflux or acid regurgitation, he denies dysphagia or odynophagia.   Social Hx: no etoh, tobacco or illicit drugs Family hx:no liver disease or other colon cancer other than personal  Last Colonoscopy:(08/25/2020) at [facility] by Dr. Adelina Mings - showed normal colon apart from venous lake in the left colon.  Last Endoscopy:(05/25/2021) at [facility]): Moderate inflammation characterized by adherent blood, erythema, and friability in the gastric body, antrum, and prepyloric region, as well as 3 columns of 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 and distal esophagus.  Recommendations:  Repeat colonoscopy and EGD  Past Medical History:  Diagnosis Date   Diabetes ([place])    Hypertension     Past Surgical History:  Procedure Laterality Date   COLON SURGERY     TONSILLECTOMY      Current Outpatient Medications  Medication Sig Dispense Refill   ACCU-CHEK AVIVA PLUS test strip TEST BLOOD SUGAR D  3   allopurinol (ZYLOPRIM) 300 MG tablet Take 300 mg by mouth daily.     aspirin 81 MG EC tablet Take by mouth.     atorvastatin (LIPITOR) 10 MG tablet Take 10 mg by mouth once a week.     clonazePAM (KLONOPIN) 0.5 MG tablet Take 0.5 mg by mouth 2 (two) times daily as needed.      cyclobenzaprine (FLEXERIL) 10 MG tablet Take 10 mg by mouth at bedtime. prn     DULoxetine (CYMBALTA) 60 MG capsule Take 1 capsule by mouth daily.      gabapentin (NEURONTIN) 300 MG capsule Take 300 mg by mouth at bedtime.     glipiZIDE (GLUCOTROL XL) 10 MG 24 hr tablet Take 10 mg by mouth daily.     hydroxyurea (HYDREA) 500 MG capsule Take 2 capsules (1,000 mg total) by mouth 4 (four) times a week AND 3 capsules (1,500 mg total) 3 (three) times a week. [2 capsules on Tu/Th/Sa/Su, 3 capsules on M/W/F]. May take with food to minimize GI side effects.. 204 capsule 3   metFORMIN (GLUCOPHAGE-XR) 500 MG 24 hr tablet Take 500 mg by mouth daily.   6   metoprolol succinate (TOPROL-XL) 50 MG 24 hr tablet Take 50 mg by mouth daily.   3   OMEPRAZOLE PO Take by mouth. Takes qhs     sildenafil (VIAGRA) 50 MG tablet Take 1 tablet (50 mg total) by mouth daily as needed for erectile dysfunction. (Patient not taking: Reported on 10/08/2021) 10 tablet 0   No current facility-administered medications for this visit.    Allergies as of 10/08/2021 - Review Complete 10/08/2021  Allergen Reaction Noted   Ivp dye  [iodinated diagnostic agents]  08/09/2011    Family History  Problem Relation Age of Onset   Dementia Mother    Cancer Paternal Uncle        lung    Social History   Socioeconomic History   Marital status: Married    Spouse name: Not on file   Number of children: Not on file   Years of education: Not on file   Highest education level: Not on file  Occupational History   Not on file  Tobacco Use   Smoking status: Former    Packs/day: 0.50    Years: 3.00    Pack years: 1.50    Types: Cigarettes    Quit date: 04/08/1978    Years since quitting: 43.5   Smokeless tobacco: Never  Vaping Use   Vaping Use: Never used  Substance and Sexual Activity   Alcohol use: Never   Drug use: Never   Sexual activity: Not on file  Other Topics Concern   Not on file  Social History Narrative   Not on file   Social Determinants of Health   Financial Resource Strain: Low Risk    Difficulty of Paying Living Expenses: Not hard at all  Food  Insecurity: No Food Insecurity   Worried About Charity fundraiser in the Last Year: Never true   [place] in the Last Year: Never true  Transportation Needs: No Transportation Needs   Lack of Transportation (Medical): No   Lack of Transportation (Non-Medical): No  Physical Activity: Inactive   Days of Exercise per Week: 0 days   Minutes of Exercise per Session: 0 min  Stress: No Stress Concern Present   Feeling of Stress : Not at all  Social Connections: Moderately Integrated   Frequency of Communication  with Friends and Family: More than three times a week   Frequency of Social Gatherings with Friends and Family: Three times a week   Attends Religious Services: 1 to 4 times per year   Active Member of Clubs or Organizations: No   Attends Archivist Meetings: Never   Marital Status: Married    Review of Systems: Gen: Denies fever, chills, anorexia. Denies fatigue, weakness, weight loss.  CV: Denies chest pain, palpitations, syncope, peripheral edema, and claudication. Resp: Denies dyspnea at rest, cough, wheezing, coughing up blood, and pleurisy. GI: Denies melena, hematochezia, nausea, vomiting, diarrhea, constipation, dysphagia, odyonophagia, early satiety or weight loss.  Derm: Denies rash, itching, dry skin Psych: Denies depression, anxiety, memory loss, confusion. No homicidal or suicidal ideation.  Heme: Denies bruising, bleeding, and enlarged lymph nodes.  Physical Exam: BP 130/79 (BP Location: Right Arm, Patient Position: Sitting, Cuff Size: Large)   Pulse 60   Temp 97.9 F (36.6 C) (Oral)   Ht 5\' 10"  (1.778 m)   Wt 236 lb (107 kg)   BMI 33.86 kg/m  General:   Alert and oriented. No distress noted. Pleasant and cooperative.  Head:  Normocephalic and atraumatic. Eyes:  Conjuctiva clear without scleral icterus. Mouth:  Oral mucosa pink and moist. Good dentition. No lesions. Heart: Normal rate and rhythm, s1 and s2 heart sounds present.  Lungs: Clear  lung sounds in all lobes. Respirations equal and unlabored. Abdomen:  +BS, soft, non-tender and non-distended. No rebound or guarding. No HSM or masses noted. Derm: No palmar erythema or jaundice Msk:  Symmetrical without gross deformities. Normal posture. Extremities:  Without edema. Neurologic:  Alert and  oriented x4 Psych:  Alert and cooperative. Normal mood and affect.  Invalid input(s): 6 MONTHS   ASSESSMENT: DELSIN COPEN is a 70 y.o. male presenting today for follow up of ongoing anemia, despite normal iron, b12 and folate earlier this year. Last EGD in May 2022 was questionable for varices without known history of liver disease.   Patient continues to have ongoing anemia, last hgb in sept 2022 was 11.7, followed by hematology for essential thrombocytosis. Had EGD by surgeon in [place] in May 2022 for ongoing anemia and positive FOBT. Reports melena at that time, he had been using NSAIDs on a regular basis then as well. EGD was concerning for esophageal varices and some inflammation with stigmata of bleeding, however, he reports he was never put on a PPI and has no known hx of cirrhosis or liver disease. He does not drink alcohol. He denies any melena or rectal bleeding, no abdominal pain, nausea, vomiting, dysphagia or odynophagia. He had normal iron studies in September 2022 and normal B12 and folate in April 2022. While patient could simply have some anemia of chronic disease, however, it would be a good idea to proceed with repeat EGD and colonoscopy given his ongoing anemia and mentioned esophageal varices, as presence of varices would indicate need for further evaluation of liver abnormalities.   Previously experiencing a cough when he would get anxious or upset, states he started taking some of his wife's omeprazole which resolved his cough. He denies any episodes of reflux, acid regurgitation, dysphagia or odynophagia. I will send Rx for omeprazole 40mg  once daily, 30-45 minutes prior to  breakfast for him, as he likely was experiencing some silent reflux symptoms.   PLAN:  Rx Omeprazole 40mg  daily, 30-45 mins prior to breakfast 2. Egd/colonoscopy 3. Continue to Avoid nsaids   Follow Up: 3 months  Leticia Mcdiarmid L. Alver Sorrow, MSN, APRN, AGNP-C Adult-Gerontology Nurse Practitioner [facility]

## 2021-10-08 NOTE — H&P (View-Only) (Signed)
Referring Provider: Caryl Bis, MD Primary Care Physician:  Caryl Bis, MD Primary GI Physician: newly established, castaneda  Chief Complaint  Patient presents with   Follow-up    Follow up on endoscopy done at [facility] surgical center. States he was having blood in stools and had colonoscopy about 2 years ago at [facility] surgical center. Has been taking some of wife's omeprazole and it is helping cough that he was told was related to reflux.    HPI:   Justin Berger is a 70 y.o. male with past medical history of colon cancer s/p R colectomy (2003), GAD, HTN, atherosclerosis, AAA w/o rupture, essential thrombocytosis.   Patient presenting today at the request of Dr. Gar Ponto to establish GI care for ongoing anemia.   Patient had EGD in May 2022 after having anemia and positive FOBT. He denied any melena at that time and endorsed some scant blood on toilet paper, occasionally. 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 He denies any history of liver disease or cirrhosis.   He continues to have anemia. He is followed by hematology for essential thrombocytosis which he is on one baby aspirin a day for. States that his hgb continues to fluctuate up and down and hematology is unsure why. He denies any blood in stools or melena, no abdominal pain, nausea, vomiting, early satiety or weight loss. He denies fatigue, dizziness or shortness of breath. He is not currently on iron pills. He reports that he was previously taking a lot of advil prior to EGD in May, however, he stopped NSAIDs after EGD.  Last hgb 11.7 (09/05/21) and iron studies WNL. B12 281 04/20/28/ folate 9.6. He is not currently on any iron  supplements. He is on one 81 mg asa per day.   He denies constipation, some diarrhea from his hydroxyurea and metformin. He reports some days stools are solid, however, some days he will have 3-4 episodes of diarrhea, will occasionally take imodium, however, this will often cause him to have constipation.   He states he previously was having a cough, he would notice it more when he got anxious or upset, states he started taking some of his wife's omeprazole for about 1 month and his cough resolved. He reports occasional hoarse voice but no sore throat, acid reflux or acid regurgitation, he denies dysphagia or odynophagia.   Social Hx: no etoh, tobacco or illicit drugs Family hx:no liver disease or other colon cancer other than personal  Last Colonoscopy:(08/25/2020) at [facility] by Dr. Adelina Mings - showed normal colon apart from venous lake in the left colon.  Last Endoscopy:(05/25/2021) at [facility]): Moderate inflammation characterized by adherent blood, erythema, and friability in the gastric body, antrum, and prepyloric region, as well as 3 columns of 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 and distal esophagus.  Recommendations:  Repeat colonoscopy and EGD  Past Medical History:  Diagnosis Date   Diabetes ([place])    Hypertension     Past Surgical History:  Procedure Laterality Date   COLON SURGERY     TONSILLECTOMY      Current Outpatient Medications  Medication Sig Dispense Refill   ACCU-CHEK AVIVA PLUS test strip TEST BLOOD SUGAR D  3   allopurinol (ZYLOPRIM) 300 MG tablet Take 300 mg by mouth daily.     aspirin 81 MG EC tablet Take by mouth.     atorvastatin (LIPITOR) 10 MG tablet Take 10 mg by mouth once a week.     clonazePAM (KLONOPIN) 0.5 MG tablet Take 0.5 mg by mouth 2 (two) times daily as needed.      cyclobenzaprine (FLEXERIL) 10 MG tablet Take 10 mg by mouth at bedtime. prn     DULoxetine (CYMBALTA) 60 MG capsule Take 1 capsule by mouth daily.      gabapentin (NEURONTIN) 300 MG capsule Take 300 mg by mouth at bedtime.     glipiZIDE (GLUCOTROL XL) 10 MG 24 hr tablet Take 10 mg by mouth daily.     hydroxyurea (HYDREA) 500 MG capsule Take 2 capsules (1,000 mg total) by mouth 4 (four) times a week AND 3 capsules (1,500 mg total) 3 (three) times a week. [2 capsules on Tu/Th/Sa/Su, 3 capsules on M/W/F]. May take with food to minimize GI side effects.. 204 capsule 3   metFORMIN (GLUCOPHAGE-XR) 500 MG 24 hr tablet Take 500 mg by mouth daily.   6   metoprolol succinate (TOPROL-XL) 50 MG 24 hr tablet Take 50 mg by mouth daily.   3   OMEPRAZOLE PO Take by mouth. Takes qhs     sildenafil (VIAGRA) 50 MG tablet Take 1 tablet (50 mg total) by mouth daily as needed for erectile dysfunction. (Patient not taking: Reported on 10/08/2021) 10 tablet 0   No current facility-administered medications for this visit.    Allergies as of 10/08/2021 - Review Complete 10/08/2021  Allergen Reaction Noted   Ivp dye  [iodinated diagnostic agents]  08/09/2011    Family History  Problem Relation Age of Onset   Dementia Mother    Cancer Paternal Uncle        lung    Social History   Socioeconomic History   Marital status: Married    Spouse name: Not on file   Number of children: Not on file   Years of education: Not on file   Highest education level: Not on file  Occupational History   Not on file  Tobacco Use   Smoking status: Former    Packs/day: 0.50    Years: 3.00    Pack years: 1.50    Types: Cigarettes    Quit date: 04/08/1978    Years since quitting: 43.5   Smokeless tobacco: Never  Vaping Use   Vaping Use: Never used  Substance and Sexual Activity   Alcohol use: Never   Drug use: Never   Sexual activity: Not on file  Other Topics Concern   Not on file  Social History Narrative   Not on file   Social Determinants of Health   Financial Resource Strain: Low Risk    Difficulty of Paying Living Expenses: Not hard at all  Food  Insecurity: No Food Insecurity   Worried About Charity fundraiser in the Last Year: Never true   [place] in the Last Year: Never true  Transportation Needs: No Transportation Needs   Lack of Transportation (Medical): No   Lack of Transportation (Non-Medical): No  Physical Activity: Inactive   Days of Exercise per Week: 0 days   Minutes of Exercise per Session: 0 min  Stress: No Stress Concern Present   Feeling of Stress : Not at all  Social Connections: Moderately Integrated   Frequency of Communication  with Friends and Family: More than three times a week   Frequency of Social Gatherings with Friends and Family: Three times a week   Attends Religious Services: 1 to 4 times per year   Active Member of Clubs or Organizations: No   Attends Archivist Meetings: Never   Marital Status: Married    Review of Systems: Gen: Denies fever, chills, anorexia. Denies fatigue, weakness, weight loss.  CV: Denies chest pain, palpitations, syncope, peripheral edema, and claudication. Resp: Denies dyspnea at rest, cough, wheezing, coughing up blood, and pleurisy. GI: Denies melena, hematochezia, nausea, vomiting, diarrhea, constipation, dysphagia, odyonophagia, early satiety or weight loss.  Derm: Denies rash, itching, dry skin Psych: Denies depression, anxiety, memory loss, confusion. No homicidal or suicidal ideation.  Heme: Denies bruising, bleeding, and enlarged lymph nodes.  Physical Exam: BP 130/79 (BP Location: Right Arm, Patient Position: Sitting, Cuff Size: Large)   Pulse 60   Temp 97.9 F (36.6 C) (Oral)   Ht 5\' 10"  (1.778 m)   Wt 236 lb (107 kg)   BMI 33.86 kg/m  General:   Alert and oriented. No distress noted. Pleasant and cooperative.  Head:  Normocephalic and atraumatic. Eyes:  Conjuctiva clear without scleral icterus. Mouth:  Oral mucosa pink and moist. Good dentition. No lesions. Heart: Normal rate and Justin, s1 and s2 heart sounds present.  Lungs: Clear  lung sounds in all lobes. Respirations equal and unlabored. Abdomen:  +BS, soft, non-tender and non-distended. No rebound or guarding. No HSM or masses noted. Derm: No palmar erythema or jaundice Msk:  Symmetrical without gross deformities. Normal posture. Extremities:  Without edema. Neurologic:  Alert and  oriented x4 Psych:  Alert and cooperative. Normal mood and affect.  Invalid input(s): 6 MONTHS   ASSESSMENT: Justin Berger is a 70 y.o. male presenting today for follow up of ongoing anemia, despite normal iron, b12 and folate earlier this year. Last EGD in May 2022 was questionable for varices without known history of liver disease.   Patient continues to have ongoing anemia, last hgb in sept 2022 was 11.7, followed by hematology for essential thrombocytosis. Had EGD by surgeon in [place] in May 2022 for ongoing anemia and positive FOBT. Reports melena at that time, he had been using NSAIDs on a regular basis then as well. EGD was concerning for esophageal varices and some inflammation with stigmata of bleeding, however, he reports he was never put on a PPI and has no known hx of cirrhosis or liver disease. He does not drink alcohol. He denies any melena or rectal bleeding, no abdominal pain, nausea, vomiting, dysphagia or odynophagia. He had normal iron studies in September 2022 and normal B12 and folate in April 2022. While patient could simply have some anemia of chronic disease, however, it would be a good idea to proceed with repeat EGD and colonoscopy given his ongoing anemia and mentioned esophageal varices, as presence of varices would indicate need for further evaluation of liver abnormalities.   Previously experiencing a cough when he would get anxious or upset, states he started taking some of his wife's omeprazole which resolved his cough. He denies any episodes of reflux, acid regurgitation, dysphagia or odynophagia. I will send Rx for omeprazole 40mg  once daily, 30-45 minutes prior to  breakfast for him, as he likely was experiencing some silent reflux symptoms.   PLAN:  Rx Omeprazole 40mg  daily, 30-45 mins prior to breakfast 2. Egd/colonoscopy 3. Continue to Avoid nsaids   Follow Up: 3 months  Janace Decker L. Alver Sorrow, MSN, APRN, AGNP-C Adult-Gerontology Nurse Practitioner [facility]

## 2021-10-08 NOTE — Patient Instructions (Signed)
I have sent omeprazole 40mg  to your pharmacy, please take this once a day, 30-45 minutes before breakfast, if you notice you are having acid reflux, or worsening cough, please let me know.  Continue to avoid medications such as advil, aleve, naproxen, good powder. You are fine to continue your 81mg  aspirin  We will get you scheduled for EGD and coloscopy to further evaluate your anemia (blood loss). Please let us know if you have blood in your stools, black stools, or any black or bloody vomit.   Follow up 3 months

## 2021-10-10 ENCOUNTER — Other Ambulatory Visit (INDEPENDENT_AMBULATORY_CARE_PROVIDER_SITE_OTHER): Payer: Self-pay

## 2021-10-10 DIAGNOSIS — D649 Anemia, unspecified: Secondary | ICD-10-CM

## 2021-10-11 ENCOUNTER — Encounter (INDEPENDENT_AMBULATORY_CARE_PROVIDER_SITE_OTHER): Payer: Self-pay

## 2021-10-11 ENCOUNTER — Telehealth (INDEPENDENT_AMBULATORY_CARE_PROVIDER_SITE_OTHER): Payer: Self-pay

## 2021-10-11 DIAGNOSIS — Z23 Encounter for immunization: Secondary | ICD-10-CM | POA: Diagnosis not present

## 2021-10-11 MED ORDER — PEG 3350-KCL-NA BICARB-NACL 420 G PO SOLR: 4000.0000 mL | ORAL | 0 refills | Status: DC

## 2021-10-11 NOTE — Telephone Encounter (Signed)
LeighAnn Arlenis Blaydes, CMA  

## 2021-10-19 ENCOUNTER — Encounter (HOSPITAL_COMMUNITY): Payer: Self-pay

## 2021-10-19 ENCOUNTER — Encounter (HOSPITAL_COMMUNITY)
Admission: RE | Admit: 2021-10-19 | Discharge: 2021-10-19 | Disposition: A | Discharge status: Home / Self Care | Payer: Medicare Other | Source: Ambulatory Visit | Attending: Gastroenterology | Admitting: Gastroenterology

## 2021-10-19 VITALS — BP 125/72 | HR 56 | Temp 97.8°F | Resp 18 | Ht 70.0 in | Wt 236.0 lb

## 2021-10-19 DIAGNOSIS — Z01818 Encounter for other preprocedural examination: Secondary | ICD-10-CM | POA: Diagnosis not present

## 2021-10-19 DIAGNOSIS — D649 Anemia, unspecified: Secondary | ICD-10-CM | POA: Insufficient documentation

## 2021-10-19 NOTE — Patient Instructions (Signed)
Your procedure is scheduled on: 10/23/2021  Report to [facility] at   11:00  AM.  Call this number if you have problems the morning of surgery: [phone]   Remember:              Follow Directions on the letter you received from Your Physician's office regarding the Bowel Prep              No Smoking the day of Procedure :   Take these medicines the morning of surgery with A SIP OF WATER: Klonopin, Cymbalta, metoprolol and omeprazole   Do not wear jewelry, make-up or nail polish.    Do not bring valuables to the hospital.  Contacts, dentures or bridgework may not be worn into surgery.  .   Patients discharged the day of surgery will not be allowed to drive home.     Colonoscopy, Adult, Care After This sheet gives you information about how to care for yourself after your procedure. Your health care provider may also give you more specific instructions. If you have problems or questions, contact your health care provider. What can I expect after the procedure? After the procedure, it is common to have: A small amount of blood in your stool for 24 hours after the procedure. Some gas. Mild abdominal cramping or bloating.  Follow these instructions at home: General instructions  For the first 24 hours after the procedure: Do not drive or use machinery. Do not sign important documents. Do not drink alcohol. Do your regular daily activities at a slower pace than normal. Eat soft, easy-to-digest foods. Rest often. Take over-the-counter or prescription medicines only as told by your health care provider. It is up to you to get the results of your procedure. Ask your health care provider, or the department performing the procedure, when your results will be ready. Relieving cramping and bloating Try walking around when you have cramps or feel bloated. Apply heat to your abdomen as told by your health care provider. Use a heat source that your health care provider recommends,  such as a moist heat pack or a heating pad. Place a towel between your skin and the heat source. Leave the heat on for 20-30 minutes. Remove the heat if your skin turns bright red. This is especially important if you are unable to feel pain, heat, or cold. You may have a greater risk of getting burned. Eating and drinking Drink enough fluid to keep your urine clear or pale yellow. Resume your normal diet as instructed by your health care provider. Avoid heavy or fried foods that are hard to digest. Avoid drinking alcohol for as long as instructed by your health care provider. Contact a health care provider if: You have blood in your stool 2-3 days after the procedure. Get help right away if: You have more than a small spotting of blood in your stool. You pass large blood clots in your stool. Your abdomen is swollen. You have nausea or vomiting. You have a fever. You have increasing abdominal pain that is not relieved with medicine. This information is not intended to replace advice given to you by your health care provider. Make sure you discuss any questions you have with your health care provider. Document Released: 07/30/2004 Document Revised: 09/09/2016 Document Reviewed: 02/27/2016 Elsevier Interactive Patient Education  2018 [facility], Adult, Care After This sheet gives you information about how to care for yourself after your procedure. Your health care provider  may also give you more specific instructions. If you have problems or questions, contact your health care provider. What can I expect after the procedure? After the procedure, it is common to have: A sore throat. Mild stomach pain or discomfort. Bloating. Nausea. Follow these instructions at home:  Follow instructions from your health care provider about what to eat or drink after your procedure. Return to your normal activities as told by your health care provider. Ask your health care provider  what activities are safe for you. Take over-the-counter and prescription medicines only as told by your health care provider. If you were given a sedative during the procedure, it can affect you for several hours. Do not drive or operate machinery until your health care provider says that it is safe. Keep all follow-up visits as told by your health care provider. This is important. Contact a health care provider if you have: A sore throat that lasts longer than one day. Trouble swallowing. Get help right away if: You vomit blood or your vomit looks like coffee grounds. You have: A fever. Bloody, black, or tarry stools. A severe sore throat or you cannot swallow. Difficulty breathing. Severe pain in your chest or abdomen. Summary After the procedure, it is common to have a sore throat, mild stomach discomfort, bloating, and nausea. If you were given a sedative during the procedure, it can affect you for several hours. Do not drive or operate machinery until your health care provider says that it is safe. Follow instructions from your health care provider about what to eat or drink after your procedure. Return to your normal activities as told by your health care provider. This information is not intended to replace advice given to you by your health care provider. Make sure you discuss any questions you have with your health care provider. Document Revised: 12/14/2019 Document Reviewed: 05/18/2018 Elsevier Patient Education  2022 [employer].

## 2021-10-23 ENCOUNTER — Ambulatory Visit (HOSPITAL_COMMUNITY): Payer: Medicare Other | Admitting: Anesthesiology

## 2021-10-23 ENCOUNTER — Other Ambulatory Visit (INDEPENDENT_AMBULATORY_CARE_PROVIDER_SITE_OTHER): Payer: Self-pay

## 2021-10-23 ENCOUNTER — Encounter (HOSPITAL_COMMUNITY)
Admission: RE | Disposition: A | Discharge status: Home / Self Care | Payer: Self-pay | Source: Home / Self Care | Attending: Gastroenterology

## 2021-10-23 ENCOUNTER — Ambulatory Visit (HOSPITAL_COMMUNITY)
Admission: RE | Admit: 2021-10-23 | Discharge: 2021-10-23 | Disposition: A | Discharge status: Home / Self Care | Payer: Medicare Other | Attending: Gastroenterology | Admitting: Gastroenterology

## 2021-10-23 DIAGNOSIS — Z7984 Long term (current) use of oral hypoglycemic drugs: Secondary | ICD-10-CM | POA: Diagnosis not present

## 2021-10-23 DIAGNOSIS — R7989 Other specified abnormal findings of blood chemistry: Secondary | ICD-10-CM

## 2021-10-23 DIAGNOSIS — Z7982 Long term (current) use of aspirin: Secondary | ICD-10-CM | POA: Diagnosis not present

## 2021-10-23 DIAGNOSIS — K648 Other hemorrhoids: Secondary | ICD-10-CM | POA: Diagnosis not present

## 2021-10-23 DIAGNOSIS — I8500 Esophageal varices without bleeding: Secondary | ICD-10-CM | POA: Diagnosis not present

## 2021-10-23 DIAGNOSIS — Z87891 Personal history of nicotine dependence: Secondary | ICD-10-CM | POA: Diagnosis not present

## 2021-10-23 DIAGNOSIS — Z79899 Other long term (current) drug therapy: Secondary | ICD-10-CM | POA: Diagnosis not present

## 2021-10-23 DIAGNOSIS — D649 Anemia, unspecified: Secondary | ICD-10-CM | POA: Insufficient documentation

## 2021-10-23 DIAGNOSIS — E119 Type 2 diabetes mellitus without complications: Secondary | ICD-10-CM | POA: Diagnosis not present

## 2021-10-23 DIAGNOSIS — Z91041 Radiographic dye allergy status: Secondary | ICD-10-CM | POA: Diagnosis not present

## 2021-10-23 DIAGNOSIS — Z980 Intestinal bypass and anastomosis status: Secondary | ICD-10-CM | POA: Diagnosis not present

## 2021-10-23 DIAGNOSIS — D473 Essential (hemorrhagic) thrombocythemia: Secondary | ICD-10-CM | POA: Diagnosis not present

## 2021-10-23 DIAGNOSIS — D123 Benign neoplasm of transverse colon: Secondary | ICD-10-CM | POA: Diagnosis not present

## 2021-10-23 DIAGNOSIS — Z9049 Acquired absence of other specified parts of digestive tract: Secondary | ICD-10-CM | POA: Insufficient documentation

## 2021-10-23 DIAGNOSIS — K635 Polyp of colon: Secondary | ICD-10-CM | POA: Diagnosis not present

## 2021-10-23 DIAGNOSIS — Z85038 Personal history of other malignant neoplasm of large intestine: Secondary | ICD-10-CM | POA: Diagnosis not present

## 2021-10-23 HISTORY — PX: ESOPHAGOGASTRODUODENOSCOPY (EGD) WITH PROPOFOL: SHX5813

## 2021-10-23 HISTORY — PX: ESOPHAGEAL BANDING: SHX5518

## 2021-10-23 HISTORY — PX: POLYPECTOMY: SHX149

## 2021-10-23 HISTORY — PX: COLONOSCOPY WITH PROPOFOL: SHX5780

## 2021-10-23 LAB — HM COLONOSCOPY

## 2021-10-23 SURGERY — COLONOSCOPY WITH PROPOFOL
Anesthesia: General

## 2021-10-23 MED ORDER — OMEPRAZOLE 40 MG PO CPDR: 40.0000 mg | DELAYED_RELEASE_CAPSULE | Freq: Two times a day (BID) | ORAL | 0 refills | Status: DC

## 2021-10-23 MED ORDER — LACTATED RINGERS IV SOLN: INTRAVENOUS | Status: DC

## 2021-10-23 MED ORDER — PROPOFOL 10 MG/ML IV BOLUS
INTRAVENOUS | Status: DC | PRN
  Administered 2021-10-23: 125 ug/kg/min via INTRAVENOUS
  Administered 2021-10-23: 100 mg via INTRAVENOUS

## 2021-10-23 MED ORDER — LIDOCAINE HCL (CARDIAC) PF 100 MG/5ML IV SOSY
PREFILLED_SYRINGE | INTRAVENOUS | Status: DC | PRN
  Administered 2021-10-23: 50 mg via INTRAVENOUS
  Administered 2021-10-23: 100 mg via INTRAVENOUS

## 2021-10-23 NOTE — Anesthesia Preprocedure Evaluation (Signed)
Anesthesia Evaluation  Patient identified by MRN, date of birth, ID band Patient awake    Reviewed: Allergy & Precautions, NPO status , Patient's Chart, lab work & pertinent test results, reviewed documented beta blocker date and time   History of Anesthesia Complications Negative for: history of anesthetic complications  Airway Mallampati: II  TM Distance: >3 FB Neck ROM: Full    Dental  (+) Dental Advisory Given, Missing   Pulmonary former smoker,    Pulmonary exam normal breath sounds clear to auscultation       Cardiovascular Exercise Tolerance: Good hypertension, Pt. on medications and Pt. on home beta blockers + Peripheral Vascular Disease (AAA - 4.3 CM -01/2021)  Normal cardiovascular exam Rhythm:Regular Rate:Normal  19-Oct-2021 09:40:44 [facility]-OPS ROUTINE RECORD 02-05-1951 (9 yr) Male Caucasian Vent. rate 58 BPM PR interval 250 ms QRS duration 80 ms QT/QTcB 396/388 ms P-R-T axes 33 25 33 Sinus bradycardia with 1st degree A-V block Otherwise normal ECG Confirmed by Asencion Noble [phone]) on 10/22/2021 7:09:46 PM   Neuro/Psych negative neurological ROS  negative psych ROS   GI/Hepatic Neg liver ROS, GERD  Medicated and Controlled,  Endo/Other  diabetes, Well Controlled, Type 2, Oral Hypoglycemic Agents  Renal/GU negative Renal ROS     Musculoskeletal negative musculoskeletal ROS (+)   Abdominal   Peds  Hematology  (+) anemia ,   Anesthesia Other Findings 1. No CT evidence for acute intrathoracic, intra-abdominal or  intrapelvic abnormality.  2. Status post right hemicolectomy. No evidence for metastatic  disease within the chest, abdomen or pelvis.  3. 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 Recommend annual imaging followup by CTA or MRA. This  recommendation follows 2010  ACCF/AHA/AATS/ACR/ASA/SCA/SCAI/SIR/STS/SVM Guidelines for the   Diagnosis and Management of Patients with Thoracic Aortic Disease.  Circulation.2010; 121: S287-G811. Aortic aneurysm NOS (ICD10-I71.9)  Aortic Atherosclerosis (ICD10-I70.0).  Electronically Signed  By: Donavan Foil M.D.  On: 02/19/2021 20:24   Reproductive/Obstetrics negative OB ROS                           Anesthesia Physical Anesthesia Plan  ASA: 3  Anesthesia Plan: General   Post-op Pain Management:    Induction: Intravenous  PONV Risk Score and Plan: TIVA  Airway Management Planned: Nasal Cannula and Natural Airway  Additional Equipment:   Intra-op Plan:   Post-operative Plan:   Informed Consent: I have reviewed the patients History and Physical, chart, labs and discussed the procedure including the risks, benefits and alternatives for the proposed anesthesia with the patient or authorized representative who has indicated his/her understanding and acceptance.     Dental advisory given  Plan Discussed with: CRNA and Surgeon  Anesthesia Plan Comments:         Anesthesia Quick Evaluation

## 2021-10-23 NOTE — Op Note (Signed)
[facility] Patient Name: Justin Berger Procedure Date: 10/23/2021 11:45 AM MRN: [id number] Date of Birth: 07-29-1951 Attending MD: Maylon Peppers ,  CSN: [id number] Age: 70 Admit Type: Outpatient Procedure:                Colonoscopy Indications:              Anemia Providers:                Maylon Peppers, Caprice Kluver, [place] Risa Grill, Technician Referring MD:              Medicines:                Monitored Anesthesia Care Complications:            No immediate complications. Estimated Blood Loss:     Estimated blood loss: none. Procedure:                Pre-Anesthesia Assessment:                           - Prior to the procedure, a History and Physical                            was performed, and patient medications, allergies                            and sensitivities were reviewed. The patient's                            tolerance of previous anesthesia was reviewed.                           - The risks and benefits of the procedure and the                            sedation options and risks were discussed with the                            patient. All questions were answered and informed                            consent was obtained.                           After obtaining informed consent, the colonoscope                            was passed under direct vision. Throughout the                            procedure, the patient's blood pressure, pulse, and                            oxygen saturations were monitored continuously. The  [id number]-[id number] ([id number]) scope was introduced through                            the anus and advanced to the the ileocolonic                            anastomosis. The colonoscopy was performed without                            difficulty. The patient tolerated the procedure                            well. The quality of the bowel preparation was good. Scope In:  11:47:33 AM Scope Out: 12:12:38 PM Scope Withdrawal Time: 0 hours 20 minutes 35 seconds  Total Procedure Duration: 0 hours 25 minutes 5 seconds  Findings:      Hemorrhoids were found on perianal exam.      The neo-terminal ileum appeared normal.      There was evidence of a prior side-to-side ileo-colonic anastomosis in       the ascending colon. This was patent and was characterized by healthy       appearing mucosa. There was a surgical staple with stool attached to it.       The anastomosis was traversed.      A 4 mm polyp was found in the transverse colon. The polyp was sessile.       The polyp was removed with a cold snare. Resection and retrieval were       complete.      Non-bleeding internal hemorrhoids were found during retroflexion. The       hemorrhoids were medium-sized. Impression:               - Hemorrhoids found on perianal exam.                           - The examined portion of the ileum was normal.                           - Patent side-to-side ileo-colonic anastomosis,                            characterized by healthy appearing mucosa.                           - One 4 mm polyp in the transverse colon, removed                            with a cold snare. Resected and retrieved.                           - Non-bleeding internal hemorrhoids. Moderate Sedation:      Per Anesthesia Care Recommendation:           - Discharge patient to home (ambulatory).                           - Resume previous diet.                           -  Await pathology results.                           - Repeat colonoscopy for surveillance based on                            pathology results. Procedure Code(s):        --- Professional ---                           [phone], Colonoscopy, flexible; with removal of                            tumor(s), polyp(s), or other lesion(s) by snare                            technique Diagnosis Code(s):        --- Professional ---                            K64.8, Other hemorrhoids                           Z98.0, Intestinal bypass and anastomosis status                           K63.5, Polyp of colon                           D64.9, Anemia, unspecified CPT copyright 2019 American Medical Association. All rights reserved. The codes documented in this report are preliminary and upon coder review may  be revised to meet current compliance requirements. Maylon Peppers, MD Maylon Peppers,  10/23/2021 12:29:14 PM This report has been signed electronically. Number of Addenda: 0

## 2021-10-23 NOTE — Anesthesia Postprocedure Evaluation (Signed)
Anesthesia Post Note  Patient: Justin Berger  Procedure(s) Performed: COLONOSCOPY WITH PROPOFOL ESOPHAGOGASTRODUODENOSCOPY (EGD) WITH PROPOFOL ESOPHAGEAL BANDING POLYPECTOMY INTESTINAL  Patient location during evaluation: PACU Anesthesia Type: General Level of consciousness: awake and alert and oriented Pain management: pain level controlled Vital Signs Assessment: post-procedure vital signs reviewed and stable Respiratory status: spontaneous breathing, nonlabored ventilation and respiratory function stable Cardiovascular status: blood pressure returned to baseline and stable Postop Assessment: no apparent nausea or vomiting Anesthetic complications: no   No notable events documented.   Last Vitals:  Vitals:   10/23/21 1058 10/23/21 1219  BP: 138/78 130/83  Pulse: 63   Resp: 18 18  Temp: 36.8 C 36.9 C  SpO2: 97% 96%    Last Pain:  Vitals:   10/23/21 1219  TempSrc: Oral  PainSc:                  [name] Camille C Joycelynn Fritsche

## 2021-10-23 NOTE — Discharge Instructions (Addendum)
You are being discharged to home.  Resume your previous diet.  Take Prilosec (omeprazole) 40 mg by mouth twice a day for one month. Schedule liver elastography, if unremarkable will need to proceed with liver biopsy with transjugular pressure measurement. Your physician has recommended a repeat upper endoscopy in eight weeks for surveillance.  We are waiting for your pathology results.  Your physician has recommended a repeat colonoscopy for surveillance based on pathology results.

## 2021-10-23 NOTE — Transfer of Care (Signed)
Immediate Anesthesia Transfer of Care Note  Patient: Justin Berger  Procedure(s) Performed: COLONOSCOPY WITH PROPOFOL ESOPHAGOGASTRODUODENOSCOPY (EGD) WITH PROPOFOL ESOPHAGEAL BANDING POLYPECTOMY INTESTINAL  Patient Location: Short Stay  Anesthesia Type:General  Level of Consciousness: awake and alert   Airway & Oxygen Therapy: Patient Spontanous Breathing  Post-op Assessment: Report given to RN, Post -op Vital signs reviewed and stable and Patient moving all extremities X 4  Post vital signs: Reviewed and stable  Last Vitals:  Vitals Value Taken Time  BP    Temp    Pulse    Resp    SpO2      Last Pain:  Vitals:   10/23/21 1127  TempSrc:   PainSc: 0-No pain      Patients Stated Pain Goal: 5 (67/67/20 9470)  Complications: No notable events documented.

## 2021-10-23 NOTE — Interval H&P Note (Signed)
History and Physical Interval Note:  10/23/2021 11:27 AM Justin Berger is a 70 y.o. male with past medical history of colon cancer s/p R colectomy (2003), GAD, HTN, atherosclerosis, AAA w/o rupture, essential thrombocytosis, coming to the hospital for evaluation of history of anemia and positive FOBT.  Most recent iron stores were within normal limits.  He is not currently taking oral iron.  Hemoglobin was 11.7.  Had previous EGD and colonoscopy in the last year and a half with unclear findings.  BP 138/78   Pulse 63   Temp 98.2 F (36.8 C) (Oral)   Resp 18   SpO2 97%  GENERAL: The patient is AO x3, in no acute distress. HEENT: Head is normocephalic and atraumatic. EOMI are intact. Mouth is well hydrated and without lesions. NECK: Supple. No masses LUNGS: Clear to auscultation. No presence of rhonchi/wheezing/rales. Adequate chest expansion HEART: RRR, normal s1 and s2. ABDOMEN: Soft, nontender, no guarding, no peritoneal signs, and nondistended. BS +. No masses. EXTREMITIES: Without any cyanosis, clubbing, rash, lesions or edema. NEUROLOGIC: AOx3, no focal motor deficit. SKIN: no jaundice, no rashes  [name]  has presented today for surgery, with the diagnosis of Anemia.  The various methods of treatment have been discussed with the patient and family. After consideration of risks, benefits and other options for treatment, the patient has consented to  Procedure(s) with comments: COLONOSCOPY WITH PROPOFOL (N/A) - 12:30 ESOPHAGOGASTRODUODENOSCOPY (EGD) WITH PROPOFOL (N/A) as a surgical intervention.  The patient's history has been reviewed, patient examined, no change in status, stable for surgery.  I have reviewed the patient's chart and labs.  Questions were answered to the patient's satisfaction.     [name]

## 2021-10-23 NOTE — Anesthesia Procedure Notes (Signed)
Date/Time: 10/23/2021 12:01 PM Performed by: Tacy Learn, CRNA Pre-anesthesia Checklist: Patient identified, Suction available, Patient being monitored and Emergency Drugs available Patient Re-evaluated:Patient Re-evaluated prior to induction Oxygen Delivery Method: Nasal cannula Induction Type: IV induction Placement Confirmation: positive ETCO2

## 2021-10-23 NOTE — Op Note (Signed)
[facility] Patient Name: Justin Berger Procedure Date: 10/23/2021 11:17 AM MRN: [id number] Date of Birth: [birth year] Attending MD: Maylon Peppers ,  CSN: [id number] Age: 70 Admit Type: Outpatient Procedure:                Upper GI endoscopy Indications:              Follow-up of esophageal varices, Anemia Providers:                Maylon Peppers, Caprice Kluver, Kristine L. Risa Grill, Technician Referring MD:              Medicines:                Monitored Anesthesia Care Complications:            No immediate complications. Estimated Blood Loss:     Estimated blood loss: none. Procedure:                Pre-Anesthesia Assessment:                           - Prior to the procedure, a History and Physical                            was performed, and patient medications, allergies                            and sensitivities were reviewed. The patient's                            tolerance of previous anesthesia was reviewed.                           - The risks and benefits of the procedure and the                            sedation options and risks were discussed with the                            patient. All questions were answered and informed                            consent was obtained.                           After obtaining informed consent, the endoscope was                            passed under direct vision. Throughout the                            procedure, the patient's blood pressure, pulse, and                            oxygen saturations were monitored continuously.  The                            [id number]-[id number] ([id number]) scope was introduced through the                            mouth, and advanced to the second part of duodenum.                            The upper GI endoscopy was accomplished without                            difficulty. The patient tolerated the procedure                            well. Scope In:  11:30:18 AM Scope Out: 11:42:50 AM Total Procedure Duration: 0 hours 12 minutes 32 seconds  Findings:      Grade II varices were found in the middle third of the esophagus and in       the lower third of the esophagus. Three bands were successfully placed       with complete eradication, resulting in deflation of varices. There was       no bleeding during the procedure. Particularly, the columns were       extending a cm above the GE junction.      The entire examined stomach was normal.      The examined duodenum was normal. Impression:               - Grade II esophageal varices. Completely                            eradicated. Banded.                           - Normal stomach.                           - Normal examined duodenum.                           - No specimens collected. Moderate Sedation:      Per Anesthesia Care Recommendation:           - Discharge patient to home (ambulatory).                           - Resume previous diet.                           - Use Prilosec (omeprazole) 40 mg PO BID for 1                            month.                           - Repeat upper endoscopy in 8 weeks for  surveillance.                           - Schedule liver elastography, if unremarkable will                            need to proceed with liver biopsy with transjugular                            pressure measurement. Procedure Code(s):        --- Professional ---                           [phone], Esophagogastroduodenoscopy, flexible,                            transoral; with band ligation of esophageal/gastric                            varices Diagnosis Code(s):        --- Professional ---                           I85.00, Esophageal varices without bleeding                           D64.9, Anemia, unspecified CPT copyright 2019 American Medical Association. All rights reserved. The codes documented in this report are preliminary and upon  coder review may  be revised to meet current compliance requirements. Maylon Peppers, MD Maylon Peppers,  10/23/2021 12:22:11 PM This report has been signed electronically. Number of Addenda: 0

## 2021-10-24 LAB — SURGICAL PATHOLOGY

## 2021-10-25 ENCOUNTER — Encounter (HOSPITAL_COMMUNITY): Payer: Self-pay | Admitting: Gastroenterology

## 2021-10-25 ENCOUNTER — Other Ambulatory Visit (INDEPENDENT_AMBULATORY_CARE_PROVIDER_SITE_OTHER): Payer: Self-pay | Admitting: *Deleted

## 2021-10-25 LAB — GLUCOSE, CAPILLARY: Glucose-Capillary: 137 mg/dL — ABNORMAL HIGH (ref 70–99)

## 2021-10-25 MED ORDER — OMEPRAZOLE 40 MG PO CPDR: 40.0000 mg | DELAYED_RELEASE_CAPSULE | Freq: Two times a day (BID) | ORAL | 0 refills | Status: DC

## 2021-10-29 ENCOUNTER — Encounter (INDEPENDENT_AMBULATORY_CARE_PROVIDER_SITE_OTHER): Payer: Self-pay | Admitting: *Deleted

## 2021-10-30 ENCOUNTER — Other Ambulatory Visit (INDEPENDENT_AMBULATORY_CARE_PROVIDER_SITE_OTHER): Payer: Self-pay

## 2021-10-30 DIAGNOSIS — I8500 Esophageal varices without bleeding: Secondary | ICD-10-CM

## 2021-10-31 ENCOUNTER — Encounter (INDEPENDENT_AMBULATORY_CARE_PROVIDER_SITE_OTHER): Payer: Self-pay

## 2021-10-31 DIAGNOSIS — H40013 Open angle with borderline findings, low risk, bilateral: Secondary | ICD-10-CM | POA: Diagnosis not present

## 2021-10-31 DIAGNOSIS — H26493 Other secondary cataract, bilateral: Secondary | ICD-10-CM | POA: Diagnosis not present

## 2021-10-31 DIAGNOSIS — Z961 Presence of intraocular lens: Secondary | ICD-10-CM | POA: Diagnosis not present

## 2021-10-31 DIAGNOSIS — H34832 Tributary (branch) retinal vein occlusion, left eye, with macular edema: Secondary | ICD-10-CM | POA: Diagnosis not present

## 2021-10-31 DIAGNOSIS — E119 Type 2 diabetes mellitus without complications: Secondary | ICD-10-CM | POA: Diagnosis not present

## 2021-11-02 ENCOUNTER — Other Ambulatory Visit: Payer: Self-pay

## 2021-11-02 ENCOUNTER — Ambulatory Visit (HOSPITAL_COMMUNITY)
Admission: RE | Admit: 2021-11-02 | Discharge: 2021-11-02 | Disposition: A | Discharge status: Home / Self Care | Payer: Medicare Other | Source: Ambulatory Visit | Attending: Gastroenterology | Admitting: Gastroenterology

## 2021-11-02 DIAGNOSIS — R7989 Other specified abnormal findings of blood chemistry: Secondary | ICD-10-CM | POA: Insufficient documentation

## 2021-11-02 DIAGNOSIS — R161 Splenomegaly, not elsewhere classified: Secondary | ICD-10-CM | POA: Diagnosis not present

## 2021-11-02 DIAGNOSIS — K7689 Other specified diseases of liver: Secondary | ICD-10-CM | POA: Diagnosis not present

## 2021-11-02 NOTE — Progress Notes (Signed)
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 Justin Berger 70 y.o. 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  He was last seen by Tarri Abernethy PA-C on 09/05/2021.  At today's visit, he reports feeling fair.   Since his last visit, he has been seeing gastroenterology for work-up of esophageal varices and fatty liver disease versus cirrhosis.    He has not noted any major bleeding episodes such as hematemesis, hematochezia, melena, or epistaxis.  He admits to restless legs, but denies any chest pain, dyspnea on exertion, lightheadedness, or syncope.  His energy remains stable with baseline fatigue at about 75%.  No current signs or symptoms of blood clots such as unilateral leg swelling, new onset chest pain, dyspnea, or hemoptysis.  He denies any aquagenic pruritus or erythromelalgia, but does report that he has intermittent bluish discoloration of his palms, which is nonpainful.  Chronic neuropathy at baseline.  No vasomotor symptoms such as dizziness, tinnitus, blurry vision, or strokelike symptoms.  No B symptoms such as fever, chills, night sweats, unintentional weight loss.  He has had some right sided RUQ abdominal pain, but denies any nausea or early satiety.  He is tolerating Hydrea well.  He has a small scab on his left upper back which she reports has been very slow to heal over the past several months.  He denies any other cutaneous ulcers, nonhealing wounds, or mouth sores.  He does have chronic diarrhea, but denies any other GI symptoms.  He has 75% energy and 100%  appetite. He endorses that he is maintaining a stable weight.    REVIEW OF SYSTEMS:  Review of Systems  Constitutional:  Positive for fatigue (energy 75%). Negative for appetite change, chills, diaphoresis, fever and unexpected weight change.  HENT:   Positive for trouble swallowing (following EGD w/ banding of varices). Negative for lump/mass and nosebleeds.   Eyes:  Negative for eye problems.  Respiratory:  Negative for cough, hemoptysis and shortness of breath.   Cardiovascular:  Negative for chest pain, leg swelling and palpitations.  Gastrointestinal:  Positive for diarrhea (chronic from Metformin and Hydrea). Negative for abdominal pain, blood in stool, constipation, nausea and vomiting.  Genitourinary:  Negative for hematuria.   Musculoskeletal:  Positive for gait problem (ongoing issues with balance and walking).  Skin: Negative.        Intermittent blueish discoloration of palms  Neurological:  Positive for gait problem (ongoing issues with balance and walking) and numbness (chronic neuropathy). Negative for dizziness, headaches and light-headedness.  Hematological:  Bruises/bleeds easily (brusing more easily lately, espiecially on arms).     PAST MEDICAL/SURGICAL HISTORY:  Past Medical History:  Diagnosis Date   AAA (abdominal aortic aneurysm) without rupture 01/2021   Colon cancer ([place]) 2003   Diabetes ([place])    Essential thrombocytosis ([place])    Hypertension    Past Surgical History:  Procedure Laterality Date   COLON SURGERY  06/23/2002   right colectomy   COLONOSCOPY  08/25/2020   Dr. Adelina Mings, normal colon other than venous lake in Left colon   COLONOSCOPY WITH  PROPOFOL N/A 10/23/2021   Procedure: COLONOSCOPY WITH PROPOFOL;  Surgeon: Harvel Quale, MD;  [facility];  Service: Gastroenterology;  Laterality: N/A;  12:30   ESOPHAGEAL BANDING  10/23/2021   Procedure: ESOPHAGEAL BANDING;  Surgeon: Montez Morita, Quillian Quince, MD;  [facility]:  [facility];  Service: Gastroenterology;;   ESOPHAGOGASTRODUODENOSCOPY  05/25/2021   [facility]: moderate inflammation characterized by adherent blood, erythema, and friability in gastric body, antrum and prepyloric region, as well as 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   ESOPHAGOGASTRODUODENOSCOPY (EGD) WITH PROPOFOL N/A 10/23/2021   Procedure: ESOPHAGOGASTRODUODENOSCOPY (EGD) WITH PROPOFOL;  Surgeon: Harvel Quale, MD;  [facility];  Service: Gastroenterology;  Laterality: N/A;   POLYPECTOMY  10/23/2021   Procedure: POLYPECTOMY INTESTINAL;  Surgeon: Montez Morita, Quillian Quince, MD;  [facility];  Service: Gastroenterology;;   TONSILLECTOMY       SOCIAL HISTORY:  Social History   Socioeconomic History   Marital status: Married    Spouse name: Not on file   Number of children: Not on file   Years of education: Not on file   Highest education level: Not on file  Occupational History   Not on file  Tobacco Use   Smoking status: Former    Packs/day: 0.50    Years: 3.00    Pack years: 1.50    Types: Cigarettes    Quit date: 04/08/1978    Years since quitting: 43.6   Smokeless tobacco: Never  Vaping Use   Vaping Use: Never used  Substance and Sexual Activity   Alcohol use: Never   Drug use: Never   Sexual activity: Not on file  Other Topics Concern   Not on file  Social History Narrative   Not on file   Social Determinants of Health   Financial Resource Strain: Low Risk    Difficulty of Paying Living Expenses: Not hard at all  Food Insecurity: No Food Insecurity   Worried About Charity fundraiser in the Last Year: Never true   [place] in the Last Year: Never true  Transportation Needs: No Transportation Needs   Lack of Transportation (Medical): No   Lack of Transportation (Non-Medical): No  Physical Activity: Inactive   Days of Exercise per Week: 0 days   Minutes of Exercise per Session: 0 min   Stress: No Stress Concern Present   Feeling of Stress : Not at all  Social Connections: Moderately Integrated   Frequency of Communication with Friends and Family: More than three times a week   Frequency of Social Gatherings with Friends and Family: Three times a week   Attends Religious Services: 1 to 4 times per year   Active Member of Clubs or Organizations: No   Attends Archivist Meetings: Never   Marital Status: Married  Human resources officer Violence: Not At Risk   Fear of Current or Ex-Partner: No   Emotionally Abused: No   Physically Abused: No   Sexually Abused: No    FAMILY HISTORY:  Family History  Problem Relation Age of Onset   Dementia Mother    Cancer Paternal Uncle        lung    CURRENT MEDICATIONS:  Outpatient Encounter Medications as of 11/05/2021  Medication Sig Note   ACCU-CHEK AVIVA PLUS test strip TEST BLOOD SUGAR D    allopurinol (ZYLOPRIM) 300 MG tablet Take 300 mg by mouth daily.    aspirin 81  MG EC tablet Take by mouth.    atorvastatin (LIPITOR) 10 MG tablet Take 10 mg by mouth once a week.    clonazePAM (KLONOPIN) 0.5 MG tablet Take 0.5 mg by mouth 2 (two) times daily as needed.     cyclobenzaprine (FLEXERIL) 10 MG tablet Take 10 mg by mouth at bedtime. prn    DULoxetine (CYMBALTA) 60 MG capsule Take 1 capsule by mouth daily.    gabapentin (NEURONTIN) 300 MG capsule Take 300 mg by mouth at bedtime.    glipiZIDE (GLUCOTROL XL) 10 MG 24 hr tablet Take 10 mg by mouth daily. 10/08/2021: One half bid   hydroxyurea (HYDREA) 500 MG capsule Take 2 capsules (1,000 mg total) by mouth 4 (four) times a week AND 3 capsules (1,500 mg total) 3 (three) times a week. [2 capsules on Tu/Th/Sa/Su, 3 capsules on M/W/F]. May take with food to minimize GI side effects..    metFORMIN (GLUCOPHAGE-XR) 500 MG 24 hr tablet Take 500 mg by mouth daily.     metoprolol succinate (TOPROL-XL) 50 MG 24 hr tablet Take 50 mg by mouth daily.     omeprazole (PRILOSEC) 40 MG  capsule Take 1 capsule (40 mg total) by mouth 2 (two) times daily.    sildenafil (VIAGRA) 50 MG tablet Take 1 tablet (50 mg total) by mouth daily as needed for erectile dysfunction.    No facility-administered encounter medications on file as of 11/05/2021.    ALLERGIES:  Allergies  Allergen Reactions   Ivp Dye  [Iodinated Diagnostic Agents]      PHYSICAL EXAM:  ECOG PERFORMANCE STATUS: 1 - Symptomatic but completely ambulatory  There were no vitals filed for this visit. There were no vitals filed for this visit. Physical Exam Constitutional:      Appearance: Normal appearance. He is obese.  HENT:     Head: Normocephalic and atraumatic.     Mouth/Throat:     Mouth: Mucous membranes are moist.  Eyes:     Extraocular Movements: Extraocular movements intact.     Pupils: Pupils are equal, round, and reactive to light.  Cardiovascular:     Rate and Rhythm: Normal rate and regular rhythm.     Pulses: Normal pulses.     Heart sounds: Normal heart sounds.  Pulmonary:     Effort: Pulmonary effort is normal.     Breath sounds: Normal breath sounds.  Abdominal:     General: Abdomen is protuberant. Bowel sounds are normal.     Palpations: Abdomen is soft.     Tenderness: There is no abdominal tenderness.  Musculoskeletal:        General: No swelling.     Right lower leg: No edema.     Left lower leg: No edema.  Lymphadenopathy:     Cervical: No cervical adenopathy.  Skin:    General: Skin is warm and dry.     Findings: Bruising (bilateral forearms) and lesion (small scan noted to left upper back) present.  Neurological:     General: No focal deficit present.     Mental Status: He is alert and oriented to person, place, and time.  Psychiatric:        Mood and Affect: Mood normal.        Behavior: Behavior normal.     LABORATORY DATA:  I have reviewed the labs as listed.  CBC    Component Value Date/Time   WBC 8.0 09/05/2021 0918   RBC 3.67 (L) 09/05/2021 0918   HGB  11.7 (L) 09/05/2021 0918   HCT 37.2 (L) 09/05/2021 0918   PLT 530 (H) 09/05/2021 0918   MCV 101.4 (H) 09/05/2021 0918   MCH 31.9 09/05/2021 0918   MCHC 31.5 09/05/2021 0918   RDW 19.3 (H) 09/05/2021 0918   LYMPHSABS 1.4 09/05/2021 0918   MONOABS 1.1 (H) 09/05/2021 0918   EOSABS 0.1 09/05/2021 0918   BASOSABS 0.1 09/05/2021 0918   CMP Latest Ref Rng & Units 09/05/2021 04/23/2021 11/29/2020  Glucose 70 - 99 mg/dL 153(H) 92 220(H)  BUN 8 - 23 mg/dL 23 19 17   Creatinine 0.61 - 1.24 mg/dL 1.17 1.19 1.22  Sodium 135 - 145 mmol/L 138 134(L) 134(L)  Potassium 3.5 - 5.1 mmol/L 4.4 4.2 4.1  Chloride 98 - 111 mmol/L 104 101 103  CO2 22 - 32 mmol/L 25 25 21(L)  Calcium 8.9 - 10.3 mg/dL 9.2 8.9 9.6  Total Protein 6.5 - 8.1 g/dL 6.5 6.2(L) 6.8  Total Bilirubin 0.3 - 1.2 mg/dL 0.8 0.8 0.9  Alkaline Phos 38 - 126 U/L 44 43 38  AST 15 - 41 U/L 12(L) 18 21  ALT 0 - 44 U/L 19 29 34    DIAGNOSTIC IMAGING:  I have independently reviewed the relevant imaging and discussed with the patient.  ASSESSMENT & PLAN: 1.  Essential thrombocytosis, CALR+: - High risk disease with history of thrombosis (left leg DVT) and age > 65 - Bone marrow biopsy 12/10/2017 by Dr. Delton Coombes at [facility] in [place], [place] cytometty for leukemia/lymphoma was non-diagnostic: "No significant diagnostic immunophenotypic abnormality detected." Pathology review showed 60% cellularity with significant megakaryocytic hyperplasia with clusters of megakaryocytes; reticulin stain shows slight (Grade 1) reticulin fibrosis, some centered around areas of megakaryocyte clusters Cytogenic analysis showed normal male karyotype 46,XY[20] Noted to be at risk for progression to post essential thrombocythemia related myelofibrosis, but peripheral blood did not show other features of myelofibrosis, such as leukoerythroblastic blood or atypical megakaryocyte morphology -Intermittent bluish discoloration of bilateral palms,  nonpainful.  No aquagenic pruritus or erythromelalgias.  Mild vasomotor symptoms such as intermittent blurry vision; no tinnitus, headache, strokelike symptoms.  - Hydroxyurea started in December 2018 - current dose 1000 mg TTS S and 1500 mg MWF. - He is on aspirin 81 mg daily. - He is tolerating Hydrea well without side effects apart from chronic mild diarrhea. - CT abdomen and pelvis on 09/29/2017 showed splenomegaly with spleen 14.5 cm.  Abdominal ultrasound (11/02/2021): Stable splenomegaly with spleen 14.3 cm. - No palpable adenopathy or splenomegaly on exam - Most recent labs (11/05/2021): Platelets 490, with Hgb 11.0/MCV 100.9.  LDH normal. - Goal is platelets < 500 - PLAN: Continue Hydrea 1000 mg TTSS and 1500 mg MWF. - Continue aspirin 81 mg daily. - Repeat labs and RTC in 3 months.  2.  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 - Hemoccult stool x3 were positive - EGD (05/25/2021 at [facility]): Moderate inflammation characterized by adherent blood, erythema, and friability in the gastric body, antrum, and prepyloric region, as well as 3 columns of 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 and distal esophagus.  (Currently being worked up by Dr. Jenetta Downer for fatty liver disease versus cirrhosis). -  EGD (10/23/2021): Grade 2 varices s/p banding - Colonoscopy (10/23/2021): Internal and external hemorrhoids, patent ileocolonic anastomosis, polyp x1 - Laboratory work-up otherwise revealed LDH was slightly elevated at 245.  Erythropoietin was elevated at 67.2.  Reticulocytes 2.9%, with reticulocyte index < 2 indicating hypoproliferation.  Peripheral smear showed macrocytic anemia, neutrophilic  left shift, and thrombocytosis.  SPEP/IFE were normal.  Hemolysis labs unremarkable. - Differential diagnosis favors multifactorial anemia due to chronic GI blood loss, myelosuppressive effects of hydroxyurea, and anemia related to chronic disease - Most recent labs (11/05/2021): Hgb 11.0/MCV 100.9, normal ferritin 277 with iron saturation 28%. - Denies any gross signs or symptoms of blood loss such as hematemesis, hematochezia, or melena  - Symptomatic with ongoing chronic fatigue  - PLAN: No indication for IV iron at this time.  . -  Recommend continued follow-up with gastroenterology for suspected GI blood loss and liver disease. -Continue current dose of Hydrea as above.  Would consider decrease dose if Hgb less than 10. - Repeat CBC and iron studies in 3 months.    3. Personal history of colon cancer - Patient reports history of stage II colon cancer in ~ 2004 - Underwent right hemicolectomy and chemotherapy with Leukovorin and 5FU - Most recent colonoscopy in August 2021 was normal, due for repeat colonoscopy in 5 years   4. 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 - Repeat labs and RTC in 3 months  All questions were answered. The patient knows to call the clinic with any problems, questions or concerns.  Medical decision making: Moderate  Time spent on visit: I spent 20 minutes counseling the patient face to face. The total time spent in the appointment was 30 minutes and more than 50% was on counseling.   Harriett Rush, PA-C  11/05/2021 11:17 AM

## 2021-11-05 ENCOUNTER — Inpatient Hospital Stay (HOSPITAL_COMMUNITY): Payer: Medicare Other

## 2021-11-05 ENCOUNTER — Inpatient Hospital Stay (HOSPITAL_COMMUNITY): Payer: Medicare Other | Attending: Physician Assistant | Admitting: Physician Assistant

## 2021-11-05 ENCOUNTER — Other Ambulatory Visit: Payer: Self-pay

## 2021-11-05 ENCOUNTER — Encounter (INDEPENDENT_AMBULATORY_CARE_PROVIDER_SITE_OTHER): Payer: Self-pay

## 2021-11-05 VITALS — BP 129/74 | HR 61 | Temp 98.2°F | Resp 18 | Wt 236.2 lb

## 2021-11-05 DIAGNOSIS — Z85820 Personal history of malignant melanoma of skin: Secondary | ICD-10-CM | POA: Insufficient documentation

## 2021-11-05 DIAGNOSIS — Z85038 Personal history of other malignant neoplasm of large intestine: Secondary | ICD-10-CM | POA: Insufficient documentation

## 2021-11-05 DIAGNOSIS — D500 Iron deficiency anemia secondary to blood loss (chronic): Secondary | ICD-10-CM | POA: Insufficient documentation

## 2021-11-05 DIAGNOSIS — Z87891 Personal history of nicotine dependence: Secondary | ICD-10-CM | POA: Insufficient documentation

## 2021-11-05 DIAGNOSIS — D473 Essential (hemorrhagic) thrombocythemia: Secondary | ICD-10-CM | POA: Diagnosis not present

## 2021-11-05 DIAGNOSIS — D539 Nutritional anemia, unspecified: Secondary | ICD-10-CM

## 2021-11-05 DIAGNOSIS — D649 Anemia, unspecified: Secondary | ICD-10-CM

## 2021-11-05 LAB — CBC WITH DIFFERENTIAL/PLATELET
Abs Immature Granulocytes: 0.37 10*3/uL — ABNORMAL HIGH (ref 0.00–0.07)
Basophils Absolute: 0.1 10*3/uL (ref 0.0–0.1)
Basophils Relative: 2 %
Eosinophils Absolute: 0 10*3/uL (ref 0.0–0.5)
Eosinophils Relative: 1 %
HCT: 35.2 % — ABNORMAL LOW (ref 39.0–52.0)
Hemoglobin: 11 g/dL — ABNORMAL LOW (ref 13.0–17.0)
Immature Granulocytes: 6 %
Lymphocytes Relative: 19 %
Lymphs Abs: 1.1 10*3/uL (ref 0.7–4.0)
MCH: 31.5 pg (ref 26.0–34.0)
MCHC: 31.3 g/dL (ref 30.0–36.0)
MCV: 100.9 fL — ABNORMAL HIGH (ref 80.0–100.0)
Monocytes Absolute: 0.8 10*3/uL (ref 0.1–1.0)
Monocytes Relative: 13 %
Neutro Abs: 3.5 10*3/uL (ref 1.7–7.7)
Neutrophils Relative %: 59 %
Platelets: 490 10*3/uL — ABNORMAL HIGH (ref 150–400)
RBC: 3.49 MIL/uL — ABNORMAL LOW (ref 4.22–5.81)
RDW: 18.4 % — ABNORMAL HIGH (ref 11.5–15.5)
WBC: 5.8 10*3/uL (ref 4.0–10.5)
nRBC: 0.3 % — ABNORMAL HIGH (ref 0.0–0.2)

## 2021-11-05 LAB — COMPREHENSIVE METABOLIC PANEL
ALT: 14 U/L (ref 0–44)
AST: 13 U/L — ABNORMAL LOW (ref 15–41)
Albumin: 4 g/dL (ref 3.5–5.0)
Alkaline Phosphatase: 42 U/L (ref 38–126)
Anion gap: 8 (ref 5–15)
BUN: 12 mg/dL (ref 8–23)
CO2: 25 mmol/L (ref 22–32)
Calcium: 9 mg/dL (ref 8.9–10.3)
Chloride: 105 mmol/L (ref 98–111)
Creatinine, Ser: 0.99 mg/dL (ref 0.61–1.24)
GFR, Estimated: >60 mL/min (ref >60)
Glucose, Bld: 166 mg/dL — ABNORMAL HIGH (ref 70–99)
Potassium: 3.9 mmol/L (ref 3.5–5.1)
Sodium: 138 mmol/L (ref 135–145)
Total Bilirubin: 0.7 mg/dL (ref 0.3–1.2)
Total Protein: 6.3 g/dL — ABNORMAL LOW (ref 6.5–8.1)

## 2021-11-05 LAB — IRON AND TIBC
Iron: 98 ug/dL (ref 45–182)
Saturation Ratios: 28 % (ref 17.9–39.5)
TIBC: 355 ug/dL (ref 250–450)
UIBC: 257 ug/dL

## 2021-11-05 LAB — FERRITIN: Ferritin: 277 ng/mL (ref 24–336)

## 2021-11-05 LAB — LACTATE DEHYDROGENASE: LDH: 180 U/L (ref 98–192)

## 2021-11-05 NOTE — Patient Instructions (Signed)
[place] at [facility] Discharge Instructions  You were seen today by Tarri Abernethy PA-C for your elevated platelets (essential thrombocytosis) and your anemia.  Your platelets are improved (platelets today were 490, goal is platelets less than 500).  We will have you continue the same dose of Hydrea (2 tablets on Tuesday, Thursday, Saturday, Sunday and 3 tablets on Monday Wednesday Friday).  You continue to have mild anemia, which may be related to your Hydrea for related to possible blood loss from esophageal varices.  We will continue to monitor your blood counts, but for the time being they are good enough to continue your same dose of Hydrea.  Continue follow-up with GI doctors regarding esophageal varices and liver disease.  LABS: Return in 3 months for repeat labs  MEDICATIONS: Continue Hydrea at same dose.  No changes to other home medications.  FOLLOW-UP APPOINTMENT: Office visit in 3 months   Thank you for choosing [place] at [facility] to provide your oncology and hematology care.  To afford each patient quality time with our provider, please arrive at least 15 minutes before your scheduled appointment time.   If you have a lab appointment with the [facility] please come in thru the Main Entrance and check in at the main information desk.  You need to re-schedule your appointment should you arrive 10 or more minutes late.  We strive to give you quality time with our providers, and arriving late affects you and other patients whose appointments are after yours.  Also, if you no show three or more times for appointments you may be dismissed from the clinic at the providers discretion.     Again, thank you for choosing [facility].  Our hope is that these requests will decrease the amount of time that you wait before being seen by our physicians.        _____________________________________________________________  Should you have questions after your visit to [facility], please contact our office at [phone] and follow the prompts.  Our office hours are 8:00 a.m. and 4:30 p.m. Monday - Friday.  Please note that voicemails left after 4:00 p.m. may not be returned until the following business day.  We are closed weekends and major holidays.  You do have access to a nurse 24-7, just call the main number to the clinic [phone] and do not press any options, hold on the line and a nurse will answer the phone.    For prescription refill requests, have your pharmacy contact our office and allow 72 hours.    Due to Covid, you will need to wear a mask upon entering the hospital. If you do not have a mask, a mask will be given to you at the Main Entrance upon arrival. For doctor visits, patients may have 1 support person age 63 or older with them. For treatment visits, patients can not have anyone with them due to social distancing guidelines and our immunocompromised population.

## 2021-11-06 ENCOUNTER — Other Ambulatory Visit (INDEPENDENT_AMBULATORY_CARE_PROVIDER_SITE_OTHER): Payer: Self-pay

## 2021-11-07 ENCOUNTER — Other Ambulatory Visit (INDEPENDENT_AMBULATORY_CARE_PROVIDER_SITE_OTHER): Payer: Self-pay

## 2021-11-07 DIAGNOSIS — K766 Portal hypertension: Secondary | ICD-10-CM

## 2021-11-07 DIAGNOSIS — I8500 Esophageal varices without bleeding: Secondary | ICD-10-CM

## 2021-11-15 ENCOUNTER — Telehealth (INDEPENDENT_AMBULATORY_CARE_PROVIDER_SITE_OTHER): Payer: Self-pay | Admitting: *Deleted

## 2021-11-15 NOTE — Telephone Encounter (Signed)
Per dr Laural Golden can cause ulcer when bands come off and takes a few weeks to heal. May crush pills to make easier to swallow and if pt wants or having a hard time swallowing solids can order xray - barium swallow. Called and discussed with pt. Pt states he wants to hold off on xray and will call back if worse or not getting better.

## 2021-11-15 NOTE — Telephone Encounter (Signed)
Pt had procedure on 10/23/21 and states Dr. Jenetta Downer put some bands in his throat. States about 2 nights ago he started having trouble swallowing his pills. Some trouble with morning meds but seems worse at night. No trouble swallowing his food just his pills.   [phone]

## 2021-11-20 ENCOUNTER — Other Ambulatory Visit: Payer: Self-pay

## 2021-11-20 ENCOUNTER — Other Ambulatory Visit (INDEPENDENT_AMBULATORY_CARE_PROVIDER_SITE_OTHER): Payer: Self-pay | Admitting: Gastroenterology

## 2021-11-20 ENCOUNTER — Ambulatory Visit (HOSPITAL_COMMUNITY)
Admission: RE | Admit: 2021-11-20 | Discharge: 2021-11-20 | Disposition: A | Discharge status: Home / Self Care | Payer: Medicare Other | Source: Ambulatory Visit | Attending: Gastroenterology | Admitting: Gastroenterology

## 2021-11-20 DIAGNOSIS — I8500 Esophageal varices without bleeding: Secondary | ICD-10-CM

## 2021-11-20 DIAGNOSIS — I10 Essential (primary) hypertension: Secondary | ICD-10-CM | POA: Diagnosis not present

## 2021-11-20 DIAGNOSIS — K766 Portal hypertension: Secondary | ICD-10-CM | POA: Diagnosis not present

## 2021-11-20 DIAGNOSIS — Z8719 Personal history of other diseases of the digestive system: Secondary | ICD-10-CM | POA: Diagnosis not present

## 2021-11-20 DIAGNOSIS — Z9049 Acquired absence of other specified parts of digestive tract: Secondary | ICD-10-CM | POA: Diagnosis not present

## 2021-11-26 ENCOUNTER — Other Ambulatory Visit (HOSPITAL_COMMUNITY): Payer: Self-pay | Admitting: Gastroenterology

## 2021-11-26 ENCOUNTER — Other Ambulatory Visit (INDEPENDENT_AMBULATORY_CARE_PROVIDER_SITE_OTHER): Payer: Self-pay | Admitting: Gastroenterology

## 2021-11-26 ENCOUNTER — Telehealth (HOSPITAL_COMMUNITY): Payer: Self-pay

## 2021-11-26 DIAGNOSIS — K766 Portal hypertension: Secondary | ICD-10-CM

## 2021-11-26 NOTE — Telephone Encounter (Signed)
-----   Message from Harvel Quale, MD sent at 11/23/2021  1:46 PM EST ----- Camillia Herter,  Thanks for much for your wise input.   I spoke with the patient and he agreed to proceed with the transjugular liver biopsy with pressure measurements and possible venography.  It may need to be done with CO2 as he told me that when he received contrast once he had " cardiac arrest" and had to undergo CPR.  He will look forward to hear back from your team.  Thanks,, Maylon Peppers, MD Gastroenterology and Hepatology [facility]  ----- Message ----- From: Suzette Battiest, MD Sent: 11/23/2021   8:02 AM EST To: Katharine Look, #  Daniel,  Thanks for reaching out.  Interesting and perplexing case here.  I agree based on imaging no evidence of cirrhosis.  I'm not too impressed by the mild caudate hypertrophy.  This could be sinistral portal hypertension but on his studies this year I'm having a hard time seeing any varices which you can typically see even without contrast.  I think transjugular liver biopsy with pressure measurements is a very reasonable next step.    I see he has a contrast allergy.  If this was anaphylaxis, we could do the case with CO2 - otherwise I'd recommend premedication and using contrast.  I've included our scheduling team here at [facility] so they see my conditional approval if you decide to order the study.  Best,  Dylan   ----- Message ----- From: Montez Morita, Quillian Quince, MD Sent: 11/21/2021   4:58 PM EST To: Suzette Battiest, MD, Gabriel Rung, NP  Hi Dylan,  Hope you're doing well.  Want to ask you if I have this patient that was found to have evidence of esophageal varices but he does not have any stigmata concerning for cirrhosis.  I am concerned about portal hypertension that could be post-hepatic.  I did not see any masses that could be compressing the portal vein but he has some thrombocythemia  which could be increasing his risk of venous thrombosis.  He has some enlarged caudate lobe which may be related to some congestion due to decreased blood flow from his hepatic circulation.  I would like to ask you if he can be scheduled for a venography and a transjugular liver biopsy with measurement of gradients.   Please let me know your thoughts.  Thanks,  Maylon Peppers, MD Gastroenterology and Hepatology [facility]

## 2021-11-29 ENCOUNTER — Other Ambulatory Visit: Payer: Self-pay | Admitting: Radiology

## 2021-12-03 ENCOUNTER — Other Ambulatory Visit (HOSPITAL_COMMUNITY): Payer: Self-pay | Admitting: Gastroenterology

## 2021-12-03 ENCOUNTER — Other Ambulatory Visit: Payer: Self-pay

## 2021-12-03 ENCOUNTER — Ambulatory Visit (HOSPITAL_COMMUNITY)
Admission: RE | Admit: 2021-12-03 | Discharge: 2021-12-03 | Disposition: A | Discharge status: Home / Self Care | Payer: Medicare Other | Source: Ambulatory Visit | Attending: Gastroenterology | Admitting: Gastroenterology

## 2021-12-03 DIAGNOSIS — Z87891 Personal history of nicotine dependence: Secondary | ICD-10-CM | POA: Insufficient documentation

## 2021-12-03 DIAGNOSIS — K7581 Nonalcoholic steatohepatitis (NASH): Secondary | ICD-10-CM | POA: Insufficient documentation

## 2021-12-03 DIAGNOSIS — E119 Type 2 diabetes mellitus without complications: Secondary | ICD-10-CM | POA: Diagnosis not present

## 2021-12-03 DIAGNOSIS — I714 Abdominal aortic aneurysm, without rupture, unspecified: Secondary | ICD-10-CM | POA: Diagnosis not present

## 2021-12-03 DIAGNOSIS — D696 Thrombocytopenia, unspecified: Secondary | ICD-10-CM | POA: Diagnosis not present

## 2021-12-03 DIAGNOSIS — K766 Portal hypertension: Secondary | ICD-10-CM | POA: Diagnosis present

## 2021-12-03 DIAGNOSIS — I8500 Esophageal varices without bleeding: Secondary | ICD-10-CM | POA: Diagnosis not present

## 2021-12-03 DIAGNOSIS — K7401 Hepatic fibrosis, early fibrosis: Secondary | ICD-10-CM | POA: Diagnosis not present

## 2021-12-03 DIAGNOSIS — K7460 Unspecified cirrhosis of liver: Secondary | ICD-10-CM | POA: Diagnosis not present

## 2021-12-03 HISTORY — PX: IR TRANSCATHETER BX: IMG713

## 2021-12-03 HISTORY — PX: IR US GUIDE VASC ACCESS RIGHT: IMG2390

## 2021-12-03 LAB — CBC
HCT: 35.3 % — ABNORMAL LOW (ref 39.0–52.0)
Hemoglobin: 11 g/dL — ABNORMAL LOW (ref 13.0–17.0)
MCH: 31.4 pg (ref 26.0–34.0)
MCHC: 31.2 g/dL (ref 30.0–36.0)
MCV: 100.9 fL — ABNORMAL HIGH (ref 80.0–100.0)
Platelets: 467 10*3/uL — ABNORMAL HIGH (ref 150–400)
RBC: 3.5 MIL/uL — ABNORMAL LOW (ref 4.22–5.81)
RDW: 19.3 % — ABNORMAL HIGH (ref 11.5–15.5)
WBC: 5 10*3/uL (ref 4.0–10.5)
nRBC: 1.2 % — ABNORMAL HIGH (ref 0.0–0.2)

## 2021-12-03 LAB — PROTIME-INR
INR: 1 (ref 0.8–1.2)
Prothrombin Time: 13.3 seconds (ref 11.4–15.2)

## 2021-12-03 LAB — GLUCOSE, CAPILLARY: Glucose-Capillary: 173 mg/dL — ABNORMAL HIGH (ref 70–99)

## 2021-12-03 MED ORDER — SODIUM CHLORIDE 0.9 % IV SOLN: INTRAVENOUS | Status: DC

## 2021-12-03 MED ORDER — LIDOCAINE HCL 1 % IJ SOLN
INTRAMUSCULAR | Status: AC
  Filled 2021-12-03: qty 20

## 2021-12-03 MED ORDER — FENTANYL CITRATE (PF) 100 MCG/2ML IJ SOLN
INTRAMUSCULAR | Status: DC | PRN
  Administered 2021-12-03 (×2): 25 ug via INTRAVENOUS
  Administered 2021-12-03: 50 ug via INTRAVENOUS

## 2021-12-03 MED ORDER — FENTANYL CITRATE (PF) 100 MCG/2ML IJ SOLN
INTRAMUSCULAR | Status: AC
  Filled 2021-12-03: qty 4

## 2021-12-03 MED ORDER — MIDAZOLAM HCL 2 MG/2ML IJ SOLN
INTRAMUSCULAR | Status: AC
  Filled 2021-12-03: qty 4

## 2021-12-03 MED ORDER — LIDOCAINE HCL (PF) 1 % IJ SOLN
INTRAMUSCULAR | Status: DC | PRN
  Administered 2021-12-03: 10 mL

## 2021-12-03 MED ORDER — MIDAZOLAM HCL 2 MG/2ML IJ SOLN
INTRAMUSCULAR | Status: DC | PRN
  Administered 2021-12-03: .5 mg via INTRAVENOUS
  Administered 2021-12-03 (×2): 1 mg via INTRAVENOUS

## 2021-12-03 NOTE — H&P (Signed)
Chief Complaint: Evaluation for cirrhosis in the setting of thrombocytopenia. Request is for transjugular liver biopsy  Referring Physician(s): Harvel Quale  Supervising Physician: Ruthann Cancer  Patient Status: [facility] - Out-pt  History of Present Illness: Justin Berger is a 70 y.o. male outpatient. Former smoker. History of colon cancer s/p colectomy, DM, AAA, CALR +  essential thrombocytosis, microcytic anemia, esophageal varies s/p banding on 10.25.22. Team is requesting a transjugular liver biopsy with pressures for further evaluation of possible cirrhosis. MR abd dated 11.22.22 reads Mild enlargement the caudate lobe. No additional secondary signs cirrhosis. No splenomegaly. No ascites.  Currently without any significant complaints. Patient alert and laying in bed, calm and comfortable. Denies any fevers, headache, chest pain, SOB, cough, abdominal pain, nausea, vomiting or bleeding. Return precautions and treatment recommendations and follow-up discussed with the patient who is agreeable with the plan.     Past Medical History:  Diagnosis Date   AAA (abdominal aortic aneurysm) without rupture 01/2021   Colon cancer ([place]) 2003   Diabetes [facility])    Essential thrombocytosis ([place])    Hypertension     Past Surgical History:  Procedure Laterality Date   COLON SURGERY  06/23/2002   right colectomy   COLONOSCOPY  08/25/2020   Dr. Adelina Mings, normal colon other than venous lake in Left colon   COLONOSCOPY WITH PROPOFOL N/A 10/23/2021   Procedure: COLONOSCOPY WITH PROPOFOL;  Surgeon: Harvel Quale, MD;  [facility];  Service: Gastroenterology;  Laterality: N/A;  12:30   ESOPHAGEAL BANDING  10/23/2021   Procedure: ESOPHAGEAL BANDING;  Surgeon: Montez Morita, Quillian Quince, MD;  Location: [facility];  Service: Gastroenterology;;   ESOPHAGOGASTRODUODENOSCOPY  05/25/2021   [facility]: moderate inflammation characterized by adherent blood,  erythema, and friability in gastric body, antrum and prepyloric region, as well as 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   ESOPHAGOGASTRODUODENOSCOPY (EGD) WITH PROPOFOL N/A 10/23/2021   Procedure: ESOPHAGOGASTRODUODENOSCOPY (EGD) WITH PROPOFOL;  Surgeon: Harvel Quale, MD;  Location: [facility];  Service: Gastroenterology;  Laterality: N/A;   POLYPECTOMY  10/23/2021   Procedure: POLYPECTOMY INTESTINAL;  Surgeon: Harvel Quale, MD;  Location: [facility];  Service: Gastroenterology;;   TONSILLECTOMY      Allergies: Ivp dye  [iodinated diagnostic agents]  Medications: Prior to Admission medications   Medication Sig Start Date End Date Taking? Authorizing Provider  allopurinol (ZYLOPRIM) 300 MG tablet Take 300 mg by mouth daily. 09/02/20  Yes [provider]  Ascorbic Acid (VITAMIN C PO) Take 3 capsules by mouth daily.   Yes [provider]  aspirin 81 MG EC tablet Take 81 mg by mouth daily.   Yes [provider]  atorvastatin (LIPITOR) 10 MG tablet Take 10 mg by mouth every Sunday. 10/30/19  Yes [provider]  Cholecalciferol (VITAMIN D3 PO) Take 2 capsules by mouth daily.   Yes [provider]  clonazePAM (KLONOPIN) 0.5 MG tablet Take 0.5 mg by mouth daily as needed for anxiety. 02/14/20  Yes [provider]  cyclobenzaprine (FLEXERIL) 10 MG tablet Take 10 mg by mouth at bedtime as needed for muscle spasms.   Yes [provider]  DULoxetine (CYMBALTA) 60 MG capsule Take 60 mg by mouth daily. 04/12/21  Yes [provider]  gabapentin (NEURONTIN) 300 MG capsule Take 900 mg by mouth at bedtime. 08/01/20  Yes [provider]  glipiZIDE (GLUCOTROL XL) 10 MG 24 hr tablet Take 10  mg by mouth 2 (two) times daily. 01/17/20  Yes [provider]  hydroxyurea (HYDREA) 500 MG capsule Take 2 capsules (1,000 mg total) by mouth 4 (four) times a week AND 3 capsules  (1,500 mg total) 3 (three) times a week. [2 capsules on Tu/Th/Sa/Su, 3 capsules on M/W/F]. May take with food to minimize GI side effects.. 09/05/21  Yes Derek Jack, MD  loratadine (CLARITIN) 10 MG tablet Take 10 mg by mouth daily.   Yes [provider]  metFORMIN (GLUCOPHAGE-XR) 500 MG 24 hr tablet Take 1,000 mg by mouth at bedtime. 02/23/18  Yes [provider]  metoprolol succinate (TOPROL-XL) 50 MG 24 hr tablet Take 50 mg by mouth daily.  02/23/18  Yes [provider]  Multiple Vitamin (MULTIVITAMIN WITH MINERALS) TABS tablet Take 1 tablet by mouth daily.   Yes [provider]  tadalafil (CIALIS) 20 MG tablet Take 20 mg by mouth daily as needed for erectile dysfunction. 07/03/21  Yes [provider]  zinc gluconate 50 MG tablet Take 50 mg by mouth daily.   Yes [provider]  Accu-Chek Softclix Lancets lancets USE TO TEST BLOOD SUGAR DAILY 08/23/21   [provider]  acetaminophen (TYLENOL) 500 MG tablet Take 1,000 mg by mouth every 6 (six) hours as needed for moderate pain or headache.    [provider]  Blood Glucose Monitoring Suppl (ACCU-CHEK GUIDE ME) w/Device KIT USE TO TEST FASTING SUGAR ONCE DAILY 08/23/21   [provider]  omeprazole (PRILOSEC) 40 MG capsule Take 1 capsule (40 mg total) by mouth 2 (two) times daily. Patient taking differently: Take 40 mg by mouth daily. 10/25/21   Harvel Quale, MD     Family History  Problem Relation Age of Onset   Dementia Mother    Cancer Paternal Uncle        lung    Social History   Socioeconomic History   Marital status: Married    Spouse name: Not on file   Number of children: Not on file   Years of education: Not on file   Highest education level: Not on file  Occupational History   Not on file  Tobacco Use   Smoking status: Former    Packs/day: 0.50    Years: 3.00    Pack years: 1.50    Types: Cigarettes    Quit date: 04/08/1978     Years since quitting: 43.6   Smokeless tobacco: Never  Vaping Use   Vaping Use: Never used  Substance and Sexual Activity   Alcohol use: Never   Drug use: Never   Sexual activity: Not on file  Other Topics Concern   Not on file  Social History Narrative   Not on file   Social Determinants of Health   Financial Resource Strain: Not on file  Food Insecurity: Not on file  Transportation Needs: Not on file  Physical Activity: Not on file  Stress: Not on file  Social Connections: Not on file    Review of Systems: A 12 point ROS discussed and pertinent positives are indicated in the HPI above.  All other systems are negative.  Review of Systems  Constitutional:  Negative for fever.  HENT:  Negative for congestion.   Respiratory:  Negative for cough and shortness of breath.   Cardiovascular:  Negative for chest pain.  Gastrointestinal:  Negative for abdominal pain.  Neurological:  Negative for headaches.  Psychiatric/Behavioral:  Negative for behavioral problems and confusion.  Vital Signs: BP 138/75 (BP Location: Right Arm)   Pulse 60   Temp 97.8 F (36.6 C) (Oral)   Ht 5' 10"  (1.778 m)   Wt 232 lb (105.2 kg)   SpO2 98%   BMI 33.29 kg/m   Physical Exam Vitals and nursing note reviewed.  Constitutional:      Appearance: He is well-developed.  HENT:     Head: Normocephalic.  Cardiovascular:     Rate and Rhythm: Normal rate and regular rhythm.     Heart sounds: Normal heart sounds.  Pulmonary:     Effort: Pulmonary effort is normal.     Breath sounds: Normal breath sounds.  Musculoskeletal:        General: Normal range of motion.     Cervical back: Normal range of motion.  Skin:    General: Skin is dry.  Neurological:     Mental Status: He is alert and oriented to person, place, and time.    Imaging: MR ABDOMEN WO CONTRAST  Result Date: 11/20/2021 CLINICAL DATA:  Esophageal varices.  Portal hypertension EXAM: MRI ABDOMEN WITHOUT CONTRAST TECHNIQUE:  Multiplanar multisequence MR imaging was performed without the administration of intravenous contrast. Patient refused IV contrast due to reported allergy. COMPARISON:  None. FINDINGS: Lower chest:  Lung bases are clear. Hepatobiliary: No focal hepatic lesion on noncontrast exam. Caudate lobe is mildly enlarged. No abnormal T2 signal suggest fibrosis. No hepatic steatosis. No ascites. Postcholecystectomy. Common bile duct normal caliber. Pancreas: Normal pancreatic parenchymal intensity. No ductal dilatation or inflammation. Spleen: No splenomegaly Adrenals/urinary tract: Adrenal glands and kidneys are normal. Stomach/Bowel: Stomach and limited of the small bowel is unremarkable Vascular/Lymphatic: Abdominal aortic normal caliber. No retroperitoneal periportal lymphadenopathy. Musculoskeletal: No aggressive osseous lesion IMPRESSION: 1. No focal hepatic lesion on noncontrast exam. 2. Mild enlargement the caudate lobe. No additional secondary signs cirrhosis. No splenomegaly. No ascites Electronically Signed   By: Suzy Bouchard M.D.   On: 11/20/2021 16:17    Labs:  CBC: Recent Labs    07/05/21 0912 09/05/21 0918 11/05/21 0943 12/03/21 0835  WBC 7.8 8.0 5.8 5.0  HGB 10.2* 11.7* 11.0* 11.0*  HCT 33.3* 37.2* 35.2* 35.3*  PLT 634* 530* 490* 467*    COAGS: Recent Labs    12/03/21 0835  INR 1.0    BMP: Recent Labs    04/23/21 1355 09/05/21 0918 11/05/21 0943  NA 134* 138 138  K 4.2 4.4 3.9  CL 101 104 105  CO2 25 25 25   GLUCOSE 92 153* 166*  BUN 19 23 12   CALCIUM 8.9 9.2 9.0  CREATININE 1.19 1.17 0.99  GFRNONAA >60 >60 >60    LIVER FUNCTION TESTS: Recent Labs    04/23/21 1355 09/05/21 0918 11/05/21 0943  BILITOT 0.8 0.8 0.7  AST 18 12* 13*  ALT 29 19 14   ALKPHOS 43 44 42  PROT 6.2* 6.5 6.3*  ALBUMIN 3.9 4.3 4.0     Assessment and Plan:  70 y.o. male outpatient. Former smoker. History of colon cancer s/p colectomy, DM, AAA, CALR +  essential thrombocytosis,  microcytic anemia, esophageal varies s/p banding on 10.25.22. Team is requesting a transpolar liver biopsy with pressures for further evaluation of possible cirrhosis.  MR abd dated 11.22.22 reads Mild enlargement the caudate lobe. No additional secondary signs cirrhosis. No splenomegaly. No ascites.   Patient  is on 81 mg of ASA. Allergies  include IVP Dye reaction heart stopped. NKDA. All other labs and medications are within acceptable parameters. Patient  has been NPO since midnight. Per IR Attending Dr. Gretta Cool the procedure will be performed using CO2.   Risks and benefits of transjugular liver biopsy was discussed with the patient and/or patient's family including, but not limited to bleeding, infection, damage to adjacent structures or low yield requiring additional tests.  All of the questions were answered and there is agreement to proceed.  Consent signed and in chart.   Thank you for this interesting consult.  I greatly enjoyed meeting Justin Berger and look forward to participating in their care.  A copy of this report was sent to the requesting provider on this date.  Electronically Signed: Jacqualine Mau, NP 12/03/2021, 9:35 AM   I spent a total of  30 Minutes   in face to face in clinical consultation, greater than 50% of which was counseling/coordinating care for transjugular liver biopsy

## 2021-12-03 NOTE — Progress Notes (Signed)
Pt ambulated without difficulty or bleeding.   Discharged home with wife who will drive and stay with pt x 24 hrs 

## 2021-12-03 NOTE — Sedation Documentation (Signed)
Portal wedge pressure 8 Right atrial pressure 6/2 (4) Right hepatic vein pressure 8

## 2021-12-03 NOTE — Procedures (Signed)
Interventional Radiology Procedure Note  Procedure:  1) Transjugular liver biopsy 2) Portosystemic manometry  Findings: Please refer to procedural dictation for full description.Technically successful 18 ga core liver bx x3.  Samples placed in formalin.  Pressure measurements: Right atrium: 6/2, mean 4 mmHg Right hepatic vein: mean 3 mmHg Portal vein wedge: mean 8 mmHg  Portosystemic gradient = 4 mmHg  Complications: None immediate  Estimated Blood Loss: < 5 mL  Recommendations: Follow up Pathology results.   Ruthann Cancer, MD Pager: [phone]

## 2021-12-03 NOTE — Sedation Documentation (Signed)
SBAR given to Harvey, Therapist, sports. All questions answered to satisfaction. Pt awaiting transport to [facility] for recovery.

## 2021-12-03 NOTE — Sedation Documentation (Signed)
Attempted to call SBAR to SS. Will call back.  

## 2021-12-04 LAB — SURGICAL PATHOLOGY

## 2021-12-06 ENCOUNTER — Telehealth (INDEPENDENT_AMBULATORY_CARE_PROVIDER_SITE_OTHER): Payer: Self-pay

## 2021-12-06 NOTE — Telephone Encounter (Signed)
Spoke with patient about findings.  He understood and agreed.

## 2021-12-06 NOTE — Telephone Encounter (Signed)
Called the patient today again to discuss the results of his liver biopsy but he did not pick up the phone.  I called your numbers and his wife gave me his cell phone number but he did not pick up this phone call.  I left a detailed voice message explaining that he had mild steatohepatitis and grade 1 or 2 fibrosis out of 4 which is very mild.  No other alterations were found in the venogram or in the pressure measurements.  We will need to follow him in the clinic and he will need to proceed with the scheduled esophagogastroduodenospy.  He can take off his bandage today.

## 2021-12-06 NOTE — Telephone Encounter (Signed)
Patient called today stating he had a liver biopsy on Monday 12/03/2021, in which they went through his neck to accomplish. He would like to know # 1 when can he remove the bandage from his neck.  #2 What was the results of the biopsy? Please advise.

## 2021-12-11 DIAGNOSIS — E119 Type 2 diabetes mellitus without complications: Secondary | ICD-10-CM | POA: Diagnosis not present

## 2021-12-11 DIAGNOSIS — I10 Essential (primary) hypertension: Secondary | ICD-10-CM | POA: Diagnosis not present

## 2021-12-11 DIAGNOSIS — Z1329 Encounter for screening for other suspected endocrine disorder: Secondary | ICD-10-CM | POA: Diagnosis not present

## 2021-12-11 DIAGNOSIS — D529 Folate deficiency anemia, unspecified: Secondary | ICD-10-CM | POA: Diagnosis not present

## 2021-12-11 DIAGNOSIS — N189 Chronic kidney disease, unspecified: Secondary | ICD-10-CM | POA: Diagnosis not present

## 2021-12-11 DIAGNOSIS — D649 Anemia, unspecified: Secondary | ICD-10-CM | POA: Diagnosis not present

## 2021-12-11 DIAGNOSIS — D519 Vitamin B12 deficiency anemia, unspecified: Secondary | ICD-10-CM | POA: Diagnosis not present

## 2021-12-11 DIAGNOSIS — E782 Mixed hyperlipidemia: Secondary | ICD-10-CM | POA: Diagnosis not present

## 2021-12-11 DIAGNOSIS — E7849 Other hyperlipidemia: Secondary | ICD-10-CM | POA: Diagnosis not present

## 2021-12-12 NOTE — Patient Instructions (Signed)
EASTYN DATTILO  12/12/2021     @PREFPERIOPPHARMACY @   Your procedure is scheduled on 12/18/2021.   Report to [facility] at  [phone]  A.M.   Call this number if you have problems the morning of surgery:  [phone]   Remember:  Follow the diet instructions given to you by the office.    DO NOT take any medications for diabetes the morning of your procedure.    Take these medicines the morning of surgery with A SIP OF WATER         allopurinol, clonazepam, cymbalta, metoprolol, prilosec, claritin.     Do not wear jewelry, make-up or nail polish.  Do not wear lotions, powders, or perfumes, or deodorant.  Do not shave 48 hours prior to surgery.  Men may shave face and neck.  Do not bring valuables to the hospital.  [facility] is not responsible for any belongings or valuables.  Contacts, dentures or bridgework may not be worn into surgery.  Leave your suitcase in the car.  After surgery it may be brought to your room.  For patients admitted to the hospital, discharge time will be determined by your treatment team.  Patients discharged the day of surgery will not be allowed to drive home and must have someone with them for 24 hours.    Special instructions:   DO NOT smoke tobacco or vape for 24 hours before your procedure.  Please read over the following fact sheets that you were given. Anesthesia Post-op Instructions and Care and Recovery After Surgery      Upper Endoscopy, Adult, Care After This sheet gives you information about how to care for yourself after your procedure. Your health care provider may also give you more specific instructions. If you have problems or questions, contact your health care provider. What can I expect after the procedure? After the procedure, it is common to have: A sore throat. Mild stomach pain or discomfort. Bloating. Nausea. Follow these instructions at home:  Follow instructions from your health care provider about  what to eat or drink after your procedure. Return to your normal activities as told by your health care provider. Ask your health care provider what activities are safe for you. Take over-the-counter and prescription medicines only as told by your health care provider. If you were given a sedative during the procedure, it can affect you for several hours. Do not drive or operate machinery until your health care provider says that it is safe. Keep all follow-up visits as told by your health care provider. This is important. Contact a health care provider if you have: A sore throat that lasts longer than one day. Trouble swallowing. Get help right away if: You vomit blood or your vomit looks like coffee grounds. You have: A fever. Bloody, black, or tarry stools. A severe sore throat or you cannot swallow. Difficulty breathing. Severe pain in your chest or abdomen. Summary After the procedure, it is common to have a sore throat, mild stomach discomfort, bloating, and nausea. If you were given a sedative during the procedure, it can affect you for several hours. Do not drive or operate machinery until your health care provider says that it is safe. Follow instructions from your health care provider about what to eat or drink after your procedure. Return to your normal activities as told by your health care provider. This information is not intended to replace advice given to you by  your health care provider. Make sure you discuss any questions you have with your health care provider. Document Revised: 10/22/2019 Document Reviewed: 05/18/2018 Elsevier Patient Education  2022 [employer]. Esophageal Dilatation Esophageal dilatation, also called esophageal dilation, is a procedure to widen or open a blocked or narrowed part of the esophagus. The esophagus is the part of the body that moves food and liquid from the mouth to the stomach. You may need this procedure if: You have a buildup of scar  tissue in your esophagus that makes it difficult, painful, or impossible to swallow. This can be caused by gastroesophageal reflux disease (GERD). You have cancer of the esophagus. There is a problem with how food moves through your esophagus. In some cases, you may need this procedure repeated at a later time to dilate the esophagus gradually. Tell a health care provider about: Any allergies you have. All medicines you are taking, including vitamins, herbs, eye drops, creams, and over-the-counter medicines. Any problems you or family members have had with anesthetic medicines. Any blood disorders you have. Any surgeries you have had. Any medical conditions you have. Any antibiotic medicines you are required to take before dental procedures. Whether you are pregnant or may be pregnant. What are the risks? Generally, this is a safe procedure. However, problems may occur, including: Bleeding due to a tear in the lining of the esophagus. A hole, or perforation, in the esophagus. What happens before the procedure? Ask your health care provider about: Changing or stopping your regular medicines. This is especially important if you are taking diabetes medicines or blood thinners. Taking medicines such as aspirin and ibuprofen. These medicines can thin your blood. Do not take these medicines unless your health care provider tells you to take them. Taking over-the-counter medicines, vitamins, herbs, and supplements. Follow instructions from your health care provider about eating or drinking restrictions. Plan to have a responsible adult take you home from the hospital or clinic. Plan to have a responsible adult care for you for the time you are told after you leave the hospital or clinic. This is important. What happens during the procedure? You may be given a medicine to help you relax (sedative). A numbing medicine may be sprayed into the back of your throat, or you may gargle the  medicine. Your health care provider may perform the dilatation using various surgical instruments, such as: Simple dilators. This instrument is carefully placed in the esophagus to stretch it. Guided wire bougies. This involves using an endoscope to insert a wire into the esophagus. A dilator is passed over this wire to enlarge the esophagus. Then the wire is removed. Balloon dilators. An endoscope with a small balloon is inserted into the esophagus. The balloon is inflated to stretch the esophagus and open it up. The procedure may vary among health care providers and hospitals. What can I expect after the procedure? Your blood pressure, heart rate, breathing rate, and blood oxygen level will be monitored until you leave the hospital or clinic. Your throat may feel slightly sore and numb. This will get better over time. You will not be allowed to eat or drink until your throat is no longer numb. When you are able to drink, urinate, and sit on the edge of the bed without nausea or dizziness, you may be able to return home. Follow these instructions at home: Take over-the-counter and prescription medicines only as told by your health care provider. If you were given a sedative during the procedure, it  can affect you for several hours. Do not drive or operate machinery until your health care provider says that it is safe. Plan to have a responsible adult care for you for the time you are told. This is important. Follow instructions from your health care provider about any eating or drinking restrictions. Do not use any products that contain nicotine or tobacco, such as cigarettes, e-cigarettes, and chewing tobacco. If you need help quitting, ask your health care provider. Keep all follow-up visits. This is important. Contact a health care provider if: You have a fever. You have pain that is not relieved by medicine. Get help right away if: You have chest pain. You have trouble breathing. You  have trouble swallowing. You vomit blood. You have black, tarry, or bloody stools. These symptoms may represent a serious problem that is an emergency. Do not wait to see if the symptoms will go away. Get medical help right away. Call your local emergency services (911 in the [place] Do not drive yourself to the hospital. Summary Esophageal dilatation, also called esophageal dilation, is a procedure to widen or open a blocked or narrowed part of the esophagus. Plan to have a responsible adult take you home from the hospital or clinic. For this procedure, a numbing medicine may be sprayed into the back of your throat, or you may gargle the medicine. Do not drive or operate machinery until your health care provider says that it is safe. This information is not intended to replace advice given to you by your health care provider. Make sure you discuss any questions you have with your health care provider. Document Revised: 05/03/2020 Document Reviewed: 05/03/2020 Elsevier Patient Education  [place] After This sheet gives you information about how to care for yourself after your procedure. Your health care provider may also give you more specific instructions. If you have problems or questions, contact your health care provider. What can I expect after the procedure? After the procedure, it is common to have: Tiredness. Forgetfulness about what happened after the procedure. Impaired judgment for important decisions. Nausea or vomiting. Some difficulty with balance. Follow these instructions at home: For the time period you were told by your health care provider:   Rest as needed. Do not participate in activities where you could fall or become injured. Do not drive or use machinery. Do not drink alcohol. Do not take sleeping pills or medicines that cause drowsiness. Do not make important decisions or sign legal documents. Do not take care of  children on your own. Eating and drinking Follow the diet that is recommended by your health care provider. Drink enough fluid to keep your urine pale yellow. If you vomit: Drink water, juice, or soup when you can drink without vomiting. Make sure you have little or no nausea before eating solid foods. General instructions Have a responsible adult stay with you for the time you are told. It is important to have someone help care for you until you are awake and alert. Take over-the-counter and prescription medicines only as told by your health care provider. If you have sleep apnea, surgery and certain medicines can increase your risk for breathing problems. Follow instructions from your health care provider about wearing your sleep device: Anytime you are sleeping, including during daytime naps. While taking prescription pain medicines, sleeping medicines, or medicines that make you drowsy. Avoid smoking. Keep all follow-up visits as told by your health care provider. This is important. Contact  a health care provider if: You keep feeling nauseous or you keep vomiting. You feel light-headed. You are still sleepy or having trouble with balance after 24 hours. You develop a rash. You have a fever. You have redness or swelling around the IV site. Get help right away if: You have trouble breathing. You have new-onset confusion at home. Summary For several hours after your procedure, you may feel tired. You may also be forgetful and have poor judgment. Have a responsible adult stay with you for the time you are told. It is important to have someone help care for you until you are awake and alert. Rest as told. Do not drive or operate machinery. Do not drink alcohol or take sleeping pills. Get help right away if you have trouble breathing, or if you suddenly become confused. This information is not intended to replace advice given to you by your health care provider. Make sure you discuss any  questions you have with your health care provider. Document Revised: 08/31/2020 Document Reviewed: 11/18/2019 Elsevier Patient Education  2022 [employer].

## 2021-12-13 DIAGNOSIS — E7849 Other hyperlipidemia: Secondary | ICD-10-CM | POA: Diagnosis not present

## 2021-12-13 DIAGNOSIS — I714 Abdominal aortic aneurysm, without rupture, unspecified: Secondary | ICD-10-CM | POA: Diagnosis not present

## 2021-12-13 DIAGNOSIS — G252 Other specified forms of tremor: Secondary | ICD-10-CM | POA: Diagnosis not present

## 2021-12-13 DIAGNOSIS — M109 Gout, unspecified: Secondary | ICD-10-CM | POA: Diagnosis not present

## 2021-12-13 DIAGNOSIS — R4582 Worries: Secondary | ICD-10-CM | POA: Diagnosis not present

## 2021-12-13 DIAGNOSIS — D473 Essential (hemorrhagic) thrombocythemia: Secondary | ICD-10-CM | POA: Diagnosis not present

## 2021-12-13 DIAGNOSIS — E1142 Type 2 diabetes mellitus with diabetic polyneuropathy: Secondary | ICD-10-CM | POA: Diagnosis not present

## 2021-12-13 DIAGNOSIS — I10 Essential (primary) hypertension: Secondary | ICD-10-CM | POA: Diagnosis not present

## 2021-12-14 ENCOUNTER — Encounter (HOSPITAL_COMMUNITY)
Admission: RE | Admit: 2021-12-14 | Discharge: 2021-12-14 | Disposition: A | Discharge status: Home / Self Care | Payer: Medicare Other | Source: Ambulatory Visit | Attending: Gastroenterology | Admitting: Gastroenterology

## 2021-12-14 ENCOUNTER — Other Ambulatory Visit: Payer: Self-pay

## 2021-12-18 ENCOUNTER — Ambulatory Visit (HOSPITAL_COMMUNITY): Payer: Medicare Other | Admitting: Anesthesiology

## 2021-12-18 ENCOUNTER — Ambulatory Visit (HOSPITAL_COMMUNITY)
Admission: RE | Admit: 2021-12-18 | Discharge: 2021-12-18 | Disposition: A | Discharge status: Home / Self Care | Payer: Medicare Other | Attending: Gastroenterology | Admitting: Gastroenterology

## 2021-12-18 ENCOUNTER — Other Ambulatory Visit (INDEPENDENT_AMBULATORY_CARE_PROVIDER_SITE_OTHER): Payer: Self-pay

## 2021-12-18 ENCOUNTER — Encounter (HOSPITAL_COMMUNITY)
Admission: RE | Disposition: A | Discharge status: Home / Self Care | Payer: Self-pay | Source: Home / Self Care | Attending: Gastroenterology

## 2021-12-18 ENCOUNTER — Encounter (HOSPITAL_COMMUNITY): Payer: Self-pay | Admitting: Gastroenterology

## 2021-12-18 ENCOUNTER — Other Ambulatory Visit: Payer: Self-pay

## 2021-12-18 DIAGNOSIS — Z87891 Personal history of nicotine dependence: Secondary | ICD-10-CM | POA: Diagnosis not present

## 2021-12-18 DIAGNOSIS — Z79899 Other long term (current) drug therapy: Secondary | ICD-10-CM | POA: Diagnosis not present

## 2021-12-18 DIAGNOSIS — R1319 Other dysphagia: Secondary | ICD-10-CM

## 2021-12-18 DIAGNOSIS — K2970 Gastritis, unspecified, without bleeding: Secondary | ICD-10-CM | POA: Insufficient documentation

## 2021-12-18 DIAGNOSIS — I8500 Esophageal varices without bleeding: Secondary | ICD-10-CM | POA: Diagnosis not present

## 2021-12-18 DIAGNOSIS — E119 Type 2 diabetes mellitus without complications: Secondary | ICD-10-CM | POA: Insufficient documentation

## 2021-12-18 DIAGNOSIS — Z7982 Long term (current) use of aspirin: Secondary | ICD-10-CM | POA: Diagnosis not present

## 2021-12-18 DIAGNOSIS — E1151 Type 2 diabetes mellitus with diabetic peripheral angiopathy without gangrene: Secondary | ICD-10-CM | POA: Diagnosis not present

## 2021-12-18 DIAGNOSIS — Z7984 Long term (current) use of oral hypoglycemic drugs: Secondary | ICD-10-CM | POA: Insufficient documentation

## 2021-12-18 DIAGNOSIS — Z85038 Personal history of other malignant neoplasm of large intestine: Secondary | ICD-10-CM | POA: Diagnosis not present

## 2021-12-18 DIAGNOSIS — I10 Essential (primary) hypertension: Secondary | ICD-10-CM | POA: Diagnosis not present

## 2021-12-18 HISTORY — PX: ESOPHAGOGASTRODUODENOSCOPY (EGD) WITH PROPOFOL: SHX5813

## 2021-12-18 HISTORY — PX: BIOPSY: SHX5522

## 2021-12-18 HISTORY — PX: ESOPHAGEAL BANDING: SHX5518

## 2021-12-18 LAB — GLUCOSE, CAPILLARY: Glucose-Capillary: 180 mg/dL — ABNORMAL HIGH (ref 70–99)

## 2021-12-18 SURGERY — ESOPHAGOGASTRODUODENOSCOPY (EGD) WITH PROPOFOL
Anesthesia: General

## 2021-12-18 MED ORDER — LACTATED RINGERS IV SOLN: INTRAVENOUS | Status: DC

## 2021-12-18 MED ORDER — LIDOCAINE HCL (CARDIAC) PF 100 MG/5ML IV SOSY
PREFILLED_SYRINGE | INTRAVENOUS | Status: DC | PRN
  Administered 2021-12-18: 50 mg via INTRAVENOUS

## 2021-12-18 MED ORDER — PROPOFOL 500 MG/50ML IV EMUL
INTRAVENOUS | Status: DC | PRN
  Administered 2021-12-18: 150 ug/kg/min via INTRAVENOUS

## 2021-12-18 MED ORDER — OMEPRAZOLE 40 MG PO CPDR: 40.0000 mg | DELAYED_RELEASE_CAPSULE | Freq: Two times a day (BID) | ORAL | 0 refills | Status: DC

## 2021-12-18 MED ORDER — PROPOFOL 10 MG/ML IV BOLUS
INTRAVENOUS | Status: DC | PRN
  Administered 2021-12-18 (×2): 50 mg via INTRAVENOUS

## 2021-12-18 NOTE — Op Note (Addendum)
[facility] Patient Name: Justin Berger Procedure Date: 12/18/2021 8:07 AM MRN: [id number] Date of Birth: Jul 03, 1951 Attending MD: Maylon Peppers ,  CSN: [id number] Age: 70 Admit Type: Outpatient Procedure:                Upper GI endoscopy Indications:              Follow-up of esophageal varices Providers:                Maylon Peppers, Caprice Kluver, Raphael Gibney,                            Technician Referring MD:              Medicines:                Monitored Anesthesia Care Complications:            No immediate complications. Estimated Blood Loss:     Estimated blood loss: none. Procedure:                Pre-Anesthesia Assessment:                           - Prior to the procedure, a History and Physical                            was performed, and patient medications, allergies                            and sensitivities were reviewed. The patient's                            tolerance of previous anesthesia was reviewed.                           - The risks and benefits of the procedure and the                            sedation options and risks were discussed with the                            patient. All questions were answered and informed                            consent was obtained.                           - ASA Grade Assessment: II - A patient with mild                            systemic disease.                           After obtaining informed consent, the endoscope was                            passed under direct vision. Throughout the  procedure, the patient's blood pressure, pulse, and                            oxygen saturations were monitored continuously. The                            [id number]-[id number] ([id number]) scope was introduced through the                            mouth, and advanced to the second part of duodenum.                            The upper GI endoscopy was accomplished without                             difficulty. The patient tolerated the procedure                            well. Scope In: 8:20:52 AM Scope Out: 8:43:10 AM Total Procedure Duration: 0 hours 22 minutes 18 seconds  Findings:      Grade II varices were found in the entire esophagus. Three bands were       successfully placed with complete eradication, resulting in deflation of       varices. There was no bleeding during the procedure.      A single 5 mm submucosal nodule was found in the upper third of the       esophagus. Biopsies were taken with a cold forceps for histology.      Localized mild inflammation characterized by erosions and erythema was       found in the gastric antrum. Biopsies were taken with a cold forceps for       histology.      The examined duodenum was normal. Impression:               - Grade II esophageal varices. Completely                            eradicated. Banded.                           - Submucosal nodule found in the esophagus.                            Biopsied.                           - Gastritis. Biopsied.                           - Normal examined duodenum. Moderate Sedation:      Per Anesthesia Care Recommendation:           - Discharge patient to home (ambulatory).                           - Resume previous diet.                           -  Await pathology results.                           - Schedule esophagram.                           - Start omeprazole 40 mg twice a day for 4 weeks. Procedure Code(s):        --- Professional ---                           [phone], Esophagogastroduodenoscopy, flexible,                            transoral; with band ligation of esophageal/gastric                            varices                           [id number], Esophagogastroduodenoscopy, flexible,                            transoral; with biopsy, single or multiple Diagnosis Code(s):        --- Professional ---                           I85.00, Esophageal varices without  bleeding                           K29.70, Gastritis, unspecified, without bleeding CPT copyright 2019 American Medical Association. All rights reserved. The codes documented in this report are preliminary and upon coder review may  be revised to meet current compliance requirements. Maylon Peppers, MD Maylon Peppers,  12/18/2021 8:51:22 AM This report has been signed electronically. Number of Addenda: 0

## 2021-12-18 NOTE — Anesthesia Preprocedure Evaluation (Addendum)
Anesthesia Evaluation  Patient identified by MRN, date of birth, ID band Patient awake    Reviewed: Allergy & Precautions, NPO status , Patient's Chart, lab work & pertinent test results, reviewed documented beta blocker date and time   Airway Mallampati: II  TM Distance: >3 FB Neck ROM: Full    Dental  (+) Dental Advisory Given, Missing   Pulmonary former smoker,    Pulmonary exam normal breath sounds clear to auscultation       Cardiovascular Exercise Tolerance: Good hypertension, Pt. on medications and Pt. on home beta blockers + Peripheral Vascular Disease (AAA - 4.2 Cm)  Normal cardiovascular exam Rhythm:Regular Rate:Normal      Neuro/Psych negative neurological ROS  negative psych ROS   GI/Hepatic Neg liver ROS, GERD  Medicated and Controlled,  Endo/Other  negative endocrine ROSdiabetes, Well Controlled, Type 2, Oral Hypoglycemic Agents  Renal/GU negative Renal ROS  negative genitourinary   Musculoskeletal negative musculoskeletal ROS (+)   Abdominal   Peds negative pediatric ROS (+)  Hematology  (+) Blood dyscrasia, anemia ,   Anesthesia Other Findings 1. No CT evidence for acute intrathoracic, intra-abdominal or intrapelvic abnormality.  2. Status post right hemicolectomy. No evidence for metastatic disease within the chest, abdomen or pelvis.  3. 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 Recommend annual imaging followup by CTA or MRA.  Electronically Signed  By: Donavan Foil M.D.  On: 02/19/2021 20:24  Reproductive/Obstetrics negative OB ROS                            Anesthesia Physical Anesthesia Plan  ASA: 2  Anesthesia Plan: General   Post-op Pain Management:    Induction: Intravenous  PONV Risk Score and Plan: TIVA  Airway Management Planned: Nasal Cannula and Natural Airway  Additional Equipment:   Intra-op Plan:    Post-operative Plan:   Informed Consent: I have reviewed the patients History and Physical, chart, labs and discussed the procedure including the risks, benefits and alternatives for the proposed anesthesia with the patient or authorized representative who has indicated his/her understanding and acceptance.     Dental advisory given  Plan Discussed with: CRNA and Surgeon  Anesthesia Plan Comments:         Anesthesia Quick Evaluation

## 2021-12-18 NOTE — Transfer of Care (Signed)
Immediate Anesthesia Transfer of Care Note  Patient: Justin Berger  Procedure(s) Performed: ESOPHAGOGASTRODUODENOSCOPY (EGD) WITH PROPOFOL ESOPHAGEAL BANDING BIOPSY  Patient Location: PACU  Anesthesia Type:General  Level of Consciousness: awake, alert , oriented and patient cooperative  Airway & Oxygen Therapy: Patient Spontanous Breathing  Post-op Assessment: Report given to RN, Post -op Vital signs reviewed and stable and Patient moving all extremities X 4  Post vital signs: Reviewed and stable  Last Vitals:  Vitals Value Taken Time  BP 103/100 12/18/21 0847  Temp 36.7 C 12/18/21 0847  Pulse    Resp 14 12/18/21 0847  SpO2 95 % 12/18/21 0847    Last Pain:  Vitals:   12/18/21 0847  TempSrc: Oral  PainSc: Asleep         Complications: No notable events documented.

## 2021-12-18 NOTE — Discharge Instructions (Addendum)
You are being discharged to home.  Resume your previous diet.  We are waiting for your pathology results.  Start omeprazole 40 mg twice a day. Schedule esophagram.

## 2021-12-18 NOTE — H&P (Signed)
Justin Berger is an 70 y.o. male.   Chief Complaint: Follow-up of esophageal varices and dysphagia HPI: Justin Berger is a 70 y.o. male with past medical history of colon cancer s/p R colectomy (2003), GAD, HTN, atherosclerosis, AAA w/o rupture, essential thrombocytosis, coming to the hospital for follow-up of esophageal varices and dysphagia  Patient underwent an EGD on 10/23/2021 which showed presence of grade 2 esophageal varices which were banded x3.  He underwent a thorough evaluation for causes of portal hypertension with liver biopsy that was negative for cirrhosis, there was no presence of portal hypertension in pressure measurements and his venography was normal.  He reports that after he underwent the procedure he developed new onset dysphagia to pills and some foods such as bread.  Past Medical History:  Diagnosis Date   AAA (abdominal aortic aneurysm) without rupture 01/2021   Colon cancer ([place]) 2003   Diabetes [facility])    Essential thrombocytosis ([place])    Hypertension     Past Surgical History:  Procedure Laterality Date   COLON SURGERY  06/23/2002   right colectomy   COLONOSCOPY  08/25/2020   Dr. Adelina Mings, normal colon other than venous lake in Left colon   COLONOSCOPY WITH PROPOFOL N/A 10/23/2021   Procedure: COLONOSCOPY WITH PROPOFOL;  Surgeon: Harvel Quale, MD;  [facility];  Service: Gastroenterology;  Laterality: N/A;  12:30   ESOPHAGEAL BANDING  10/23/2021   Procedure: ESOPHAGEAL BANDING;  Surgeon: Montez Morita, Quillian Quince, MD;  [facility];  Service: Gastroenterology;;   ESOPHAGOGASTRODUODENOSCOPY  05/25/2021   [facility]: moderate inflammation characterized by adherent blood, erythema, and friability in gastric body, antrum and prepyloric region, as well as 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   ESOPHAGOGASTRODUODENOSCOPY (EGD) WITH PROPOFOL N/A 10/23/2021   Procedure:  ESOPHAGOGASTRODUODENOSCOPY (EGD) WITH PROPOFOL;  Surgeon: Harvel Quale, MD;  [facility];  Service: Gastroenterology;  Laterality: N/A;   IR TRANSCATHETER BX  12/03/2021   IR US GUIDE VASC ACCESS RIGHT  12/03/2021   POLYPECTOMY  10/23/2021   Procedure: POLYPECTOMY INTESTINAL;  Surgeon: Harvel Quale, MD;  [facility];  Service: Gastroenterology;;   TONSILLECTOMY      Family History  Problem Relation Age of Onset   Dementia Mother    Cancer Paternal Uncle        lung   Social History:  reports that he quit smoking about 43 years ago. His smoking use included cigarettes. He has a 1.50 pack-year smoking history. He has never used smokeless tobacco. He reports that he does not drink alcohol and does not use drugs.  Allergies:  Allergies  Allergen Reactions   Ivp Dye  [Iodinated Diagnostic Agents]     Heart stopped     Medications Prior to Admission  Medication Sig Dispense Refill   allopurinol (ZYLOPRIM) 300 MG tablet Take 300 mg by mouth daily.     Ascorbic Acid (VITAMIN C PO) Take 3 capsules by mouth daily.     atorvastatin (LIPITOR) 10 MG tablet Take 10 mg by mouth every Sunday.     Cholecalciferol (VITAMIN D3 PO) Take 2 capsules by mouth daily.     clonazePAM (KLONOPIN) 0.5 MG tablet Take 0.5 mg by mouth daily as needed for anxiety.     DULoxetine (CYMBALTA) 60 MG capsule Take 60 mg by mouth daily.     gabapentin (NEURONTIN) 300 MG capsule Take 900 mg by mouth at  bedtime.     glipiZIDE (GLUCOTROL XL) 10 MG 24 hr tablet Take 10 mg by mouth 2 (two) times daily.     hydroxyurea (HYDREA) 500 MG capsule Take 2 capsules (1,000 mg total) by mouth 4 (four) times a week AND 3 capsules (1,500 mg total) 3 (three) times a week. [2 capsules on Tu/Th/Sa/Su, 3 capsules on M/W/F]. May take with food to minimize GI side effects.. 204 capsule 3   loratadine (CLARITIN) 10 MG tablet Take 10 mg by mouth daily.     metFORMIN (GLUCOPHAGE-XR) 500 MG 24 hr  tablet Take 1,000 mg by mouth at bedtime.  6   metoprolol succinate (TOPROL-XL) 50 MG 24 hr tablet Take 50 mg by mouth daily.   3   Multiple Vitamin (MULTIVITAMIN WITH MINERALS) TABS tablet Take 1 tablet by mouth daily.     omeprazole (PRILOSEC) 40 MG capsule Take 1 capsule (40 mg total) by mouth 2 (two) times daily. (Patient taking differently: Take 40 mg by mouth daily.) 180 capsule 0   zinc gluconate 50 MG tablet Take 50 mg by mouth daily.     Accu-Chek Softclix Lancets lancets USE TO TEST BLOOD SUGAR DAILY     acetaminophen (TYLENOL) 500 MG tablet Take 1,000 mg by mouth every 6 (six) hours as needed for moderate pain or headache.     aspirin 81 MG EC tablet Take 81 mg by mouth daily.     Blood Glucose Monitoring Suppl (ACCU-CHEK GUIDE ME) w/Device KIT USE TO TEST FASTING SUGAR ONCE DAILY     cyclobenzaprine (FLEXERIL) 10 MG tablet Take 10 mg by mouth at bedtime as needed for muscle spasms.     tadalafil (CIALIS) 20 MG tablet Take 20 mg by mouth daily as needed for erectile dysfunction.      Results for orders placed or performed during the hospital encounter of 12/18/21 (from the past 48 hour(s))  Glucose, capillary     Status: Abnormal   Collection Time: 12/18/21  7:12 AM  Result Value Ref Range   Glucose-Capillary 180 (H) 70 - 99 mg/dL    Comment: Glucose reference range applies only to samples taken after fasting for at least 8 hours.   No results found.  Review of Systems  Constitutional: Negative.   HENT:  Positive for trouble swallowing.   Eyes: Negative.   Respiratory: Negative.    Cardiovascular: Negative.   Gastrointestinal: Negative.   Endocrine: Negative.   Genitourinary: Negative.   Musculoskeletal: Negative.   Skin: Negative.   Allergic/Immunologic: Negative.   Neurological: Negative.   Hematological: Negative.   Psychiatric/Behavioral: Negative.     Blood pressure 134/84, pulse 62, temperature 98.5 F (36.9 C), temperature source Oral, resp. rate (!) 147, SpO2  96 %. Physical Exam  GENERAL: The patient is AO x3, in no acute distress. HEENT: Head is normocephalic and atraumatic. EOMI are intact. Mouth is well hydrated and without lesions. NECK: Supple. No masses LUNGS: Clear to auscultation. No presence of rhonchi/wheezing/rales. Adequate chest expansion HEART: RRR, normal s1 and s2. ABDOMEN: Soft, nontender, no guarding, no peritoneal signs, and nondistended. BS +. No masses. EXTREMITIES: Without any cyanosis, clubbing, rash, lesions or edema. NEUROLOGIC: AOx3, no focal motor deficit. SKIN: no jaundice, no rashes  Assessment/Plan Justin Berger is a 70 y.o. male with past medical history of colon cancer s/p R colectomy (2003), GAD, HTN, atherosclerosis, AAA w/o rupture, essential thrombocytosis, coming to the hospital for follow-up of esophageal varices and dysphagia.  We will proceed with EGD.  Harvel Quale, MD 12/18/2021, 7:38 AM

## 2021-12-18 NOTE — Anesthesia Postprocedure Evaluation (Signed)
Anesthesia Post Note  Patient: Justin Berger  Procedure(s) Performed: ESOPHAGOGASTRODUODENOSCOPY (EGD) WITH PROPOFOL ESOPHAGEAL BANDING BIOPSY  Patient location during evaluation: Phase II Anesthesia Type: General Level of consciousness: awake and alert and oriented Pain management: pain level controlled Vital Signs Assessment: post-procedure vital signs reviewed and stable Respiratory status: spontaneous breathing, nonlabored ventilation and respiratory function stable Cardiovascular status: blood pressure returned to baseline and stable Postop Assessment: no apparent nausea or vomiting Anesthetic complications: no   No notable events documented.   Last Vitals:  Vitals:   12/18/21 0847 12/18/21 0859  BP: (!) 103/100 (!) 150/85  Pulse:    Resp: 14   Temp: 36.7 C   SpO2: 95%     Last Pain:  Vitals:   12/18/21 0847  TempSrc: Oral  PainSc: Asleep                 Eilene Voigt C Jamelyn Bovard

## 2021-12-20 ENCOUNTER — Other Ambulatory Visit (INDEPENDENT_AMBULATORY_CARE_PROVIDER_SITE_OTHER): Payer: Self-pay | Admitting: Gastroenterology

## 2021-12-20 ENCOUNTER — Encounter (HOSPITAL_COMMUNITY): Payer: Self-pay | Admitting: Gastroenterology

## 2021-12-20 ENCOUNTER — Other Ambulatory Visit (INDEPENDENT_AMBULATORY_CARE_PROVIDER_SITE_OTHER): Payer: Self-pay | Admitting: *Deleted

## 2021-12-20 DIAGNOSIS — I700 Atherosclerosis of aorta: Secondary | ICD-10-CM | POA: Diagnosis not present

## 2021-12-20 DIAGNOSIS — N400 Enlarged prostate without lower urinary tract symptoms: Secondary | ICD-10-CM | POA: Diagnosis not present

## 2021-12-20 DIAGNOSIS — I8500 Esophageal varices without bleeding: Secondary | ICD-10-CM

## 2021-12-20 DIAGNOSIS — I8510 Secondary esophageal varices without bleeding: Secondary | ICD-10-CM

## 2021-12-20 DIAGNOSIS — N320 Bladder-neck obstruction: Secondary | ICD-10-CM | POA: Diagnosis not present

## 2021-12-20 LAB — SURGICAL PATHOLOGY

## 2021-12-20 MED ORDER — OMEPRAZOLE 40 MG PO CPDR: 40.0000 mg | DELAYED_RELEASE_CAPSULE | Freq: Two times a day (BID) | ORAL | 0 refills | Status: DC

## 2021-12-21 MED ORDER — CARVEDILOL 3.125 MG PO TABS: 3.1250 mg | ORAL_TABLET | Freq: Two times a day (BID) | ORAL | 0 refills | Status: DC

## 2021-12-21 NOTE — Progress Notes (Signed)
Spoke with patient, will stop metoprolol and start Coreg 3.125 mg BID  Hi Leigh Ann,   Can you please schedule an EGD in 3 months? Dx: esophageal varices, esophageal tumor. Room: 3  Thanks,  Maylon Peppers, MD Gastroenterology and Hepatology [facility]

## 2021-12-25 NOTE — Progress Notes (Signed)
Thanks

## 2021-12-26 ENCOUNTER — Other Ambulatory Visit (INDEPENDENT_AMBULATORY_CARE_PROVIDER_SITE_OTHER): Payer: Self-pay | Admitting: Gastroenterology

## 2021-12-26 ENCOUNTER — Ambulatory Visit (HOSPITAL_COMMUNITY)
Admission: RE | Admit: 2021-12-26 | Discharge: 2021-12-26 | Disposition: A | Discharge status: Home / Self Care | Payer: Medicare Other | Source: Ambulatory Visit | Attending: Gastroenterology | Admitting: Gastroenterology

## 2021-12-26 ENCOUNTER — Other Ambulatory Visit: Payer: Self-pay

## 2021-12-26 DIAGNOSIS — R1319 Other dysphagia: Secondary | ICD-10-CM | POA: Diagnosis not present

## 2021-12-26 DIAGNOSIS — H26493 Other secondary cataract, bilateral: Secondary | ICD-10-CM | POA: Diagnosis not present

## 2021-12-26 DIAGNOSIS — H35372 Puckering of macula, left eye: Secondary | ICD-10-CM | POA: Diagnosis not present

## 2021-12-26 DIAGNOSIS — H40013 Open angle with borderline findings, low risk, bilateral: Secondary | ICD-10-CM | POA: Diagnosis not present

## 2021-12-26 DIAGNOSIS — R1310 Dysphagia, unspecified: Secondary | ICD-10-CM | POA: Diagnosis not present

## 2021-12-26 DIAGNOSIS — H34832 Tributary (branch) retinal vein occlusion, left eye, with macular edema: Secondary | ICD-10-CM | POA: Diagnosis not present

## 2021-12-26 DIAGNOSIS — E119 Type 2 diabetes mellitus without complications: Secondary | ICD-10-CM | POA: Diagnosis not present

## 2021-12-27 ENCOUNTER — Telehealth (INDEPENDENT_AMBULATORY_CARE_PROVIDER_SITE_OTHER): Payer: Self-pay

## 2021-12-27 NOTE — Telephone Encounter (Signed)
Spoke with the patient today regarding his carvedilol, which caused hyperglycemia  -this can happen in some patients.  Also his blood pressure has been running in the 140s to 150s range.  I advised him to go back to metoprolol but we will need to repeat the esophagogastroduodenospies more frequently every month.  He stated he would like to try higher dose of carvedilol, he will go up to 6.25 mg twice a day and he will discuss with his PCP regarding his glucose control.  We will continue with the plan to repeat his esophagogastroduodenospy in 3 months.

## 2021-12-27 NOTE — Telephone Encounter (Signed)
Patient called today stating he had been given Carvedilol 3.125 bid by our office. He says since starting the medication on 12/21/2021 his blood pressure has not been controlled and his blood sugars are elevated. He would like to go back on Metoprolol XL 50 mg once per day. Please advise.

## 2022-01-01 DIAGNOSIS — Z125 Encounter for screening for malignant neoplasm of prostate: Secondary | ICD-10-CM | POA: Diagnosis not present

## 2022-01-21 ENCOUNTER — Ambulatory Visit (INDEPENDENT_AMBULATORY_CARE_PROVIDER_SITE_OTHER): Payer: Medicare Other | Admitting: Gastroenterology

## 2022-01-21 ENCOUNTER — Other Ambulatory Visit: Payer: Self-pay

## 2022-01-21 ENCOUNTER — Encounter (INDEPENDENT_AMBULATORY_CARE_PROVIDER_SITE_OTHER): Payer: Self-pay | Admitting: Gastroenterology

## 2022-01-21 DIAGNOSIS — I8500 Esophageal varices without bleeding: Secondary | ICD-10-CM

## 2022-01-21 DIAGNOSIS — C44519 Basal cell carcinoma of skin of other part of trunk: Secondary | ICD-10-CM | POA: Diagnosis not present

## 2022-01-21 DIAGNOSIS — C4491 Basal cell carcinoma of skin, unspecified: Secondary | ICD-10-CM | POA: Diagnosis not present

## 2022-01-21 NOTE — Progress Notes (Signed)
Justin Berger, M.D. Gastroenterology & Hepatology [facility] [address] [place], [place] [address] Primary Care Physician: Caryl Bis, MD [place] [id number]  This is a telephone virtual visit.  It required patient-provider interaction for the medical decision making as documented below. The patient has consented and agreed to proceed with a Telehealth encounter.  VIRTUAL VISIT NOTE Patient location: home Provider location: office  I will communicate my assessment and recommendations to the referring MD via EMR.  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 male with PMH AAA, DM, colon cancer s/p, essential thrombocytosis, HTN, who presents for follow up of esophageal varices and liver steatohepatitis.  Patient was found to have esophageal varices incidentally in the past and since then has had EGD with banding with last banding performed on 12/18/21. He was put on Coreg but developed uncontrolled hyperglycemia and hypertension. He had an extensive workup to evaluate the reason behind the presence of esophageal varices - underwent TJ liver biopsy with pressure measurements and venography with CO2 which was unremarkable for venous abnormalities and negative for portal HTN (gradient 4 mmHg). He also underwent an MRI of the abdomen which showed mild caudate lobe enlargement but no presence of other alterations concerning for cirrhosis, no splenomegaly, ascites or findings consistent with portal HTN. Liver biopsy showed minimally active steatohepatitis and patchy portal fibrosis with rare septa (1-2/4).  Patient reported some dysphagia after he had his banding. Due to this he underwent an esophagram on 12/2882022 which showedadequate passage of contrast and  barium tablet without delay.  Possible evidence of persistent varices in distal esophagus.   Patient reports he has felt well and denies having any complaints. The patient denies having any nausea, vomiting, fever, chills, hematochezia, melena, hematemesis, abdominal distention, abdominal pain, jaundice, pruritus or weight loss. He was switched back to metoprolol by his PCP today. States his swallowing has improved by 90%.  He has presented diarrhea due to the hydroxyurea. Has a bowel movement 3-4 times a day  Last EGD:as above Last Colonoscopy: 10/23/2021 - Hemorrhoids found on perianal exam. - The examined portion of the ileum was normal. - Patent side-to-side ileo-colonic anastomosis, characterized by healthy appearing mucosa. - One 4 mm polyp in the transverse colon, removed with a cold snare. Resected and retrieved. Path - Non-bleeding internal hemorrhoids.  Recommend repeat in 5 years  Past Medical History: Past Medical History:  Diagnosis Date   AAA (abdominal aortic aneurysm) without rupture 01/2021   Colon cancer ([place]) 2003   Diabetes ([place])    Essential thrombocytosis ([place])    Hypertension     Past Surgical History: Past Surgical History:  Procedure Laterality Date   BIOPSY  12/18/2021   Procedure: BIOPSY;  Surgeon: Harvel Quale, MD;  [facility];  Service: Gastroenterology;;   COLON SURGERY  06/23/2002   right colectomy   COLONOSCOPY  08/25/2020   Dr. Adelina Mings, normal colon other than venous lake in Left colon   COLONOSCOPY WITH PROPOFOL N/A 10/23/2021   Procedure: COLONOSCOPY WITH PROPOFOL;  Surgeon: Harvel Quale, MD;  [facility];  Service: Gastroenterology;  Laterality: N/A;  12:30   ESOPHAGEAL BANDING  10/23/2021   Procedure: ESOPHAGEAL BANDING;  Surgeon: Harvel Quale, MD;  [facility];  Service: Gastroenterology;;   ESOPHAGEAL  BANDING  12/18/2021   Procedure: ESOPHAGEAL BANDING;   Surgeon: Montez Morita, Quillian Quince, MD;  [facility];  Service: Gastroenterology;;   ESOPHAGOGASTRODUODENOSCOPY  05/25/2021   [facility]: moderate inflammation characterized by adherent blood, erythema, and friability in gastric body, antrum and prepyloric region, as well as 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   ESOPHAGOGASTRODUODENOSCOPY (EGD) WITH PROPOFOL N/A 10/23/2021   Procedure: ESOPHAGOGASTRODUODENOSCOPY (EGD) WITH PROPOFOL;  Surgeon: Harvel Quale, MD;  [facility];  Service: Gastroenterology;  Laterality: N/A;   ESOPHAGOGASTRODUODENOSCOPY (EGD) WITH PROPOFOL N/A 12/18/2021   Procedure: ESOPHAGOGASTRODUODENOSCOPY (EGD) WITH PROPOFOL;  Surgeon: Harvel Quale, MD;  [facility];  Service: Gastroenterology;  Laterality: N/A;  8:05   IR TRANSCATHETER BX  12/03/2021   IR US GUIDE VASC ACCESS RIGHT  12/03/2021   POLYPECTOMY  10/23/2021   Procedure: POLYPECTOMY INTESTINAL;  Surgeon: Montez Morita, Quillian Quince, MD;  [facility];  Service: Gastroenterology;;   TONSILLECTOMY      Family History: Family History  Problem Relation Age of Onset   Dementia Mother    Cancer Paternal Uncle        lung    Social History: Social History   Tobacco Use  Smoking Status Former   Packs/day: 0.50   Years: 3.00   Pack years: 1.50   Types: Cigarettes   Quit date: 04/08/1978   Years since quitting: 43.8  Smokeless Tobacco Never   Social History   Substance and Sexual Activity  Alcohol Use Never   Social History   Substance and Sexual Activity  Drug Use Never    Allergies: Allergies  Allergen Reactions   Ivp Dye  [Iodinated Contrast Media]     Heart stopped     Medications: Current Outpatient Medications  Medication Sig Dispense Refill   Accu-Chek Softclix Lancets lancets USE TO TEST BLOOD SUGAR DAILY     acetaminophen (TYLENOL) 500 MG tablet Take 1,000 mg by mouth every 6 (six)  hours as needed for moderate pain or headache.     allopurinol (ZYLOPRIM) 300 MG tablet Take 300 mg by mouth daily.     Ascorbic Acid (VITAMIN C PO) Take 3 capsules by mouth daily.     aspirin 81 MG EC tablet Take 81 mg by mouth daily.     atorvastatin (LIPITOR) 10 MG tablet Take 10 mg by mouth every Sunday.     Blood Glucose Monitoring Suppl (ACCU-CHEK GUIDE ME) w/Device KIT USE TO TEST FASTING SUGAR ONCE DAILY     Cholecalciferol (VITAMIN D3 PO) Take 2 capsules by mouth daily.     clonazePAM (KLONOPIN) 0.5 MG tablet Take 0.5 mg by mouth daily as needed for anxiety.     cyclobenzaprine (FLEXERIL) 10 MG tablet Take 10 mg by mouth at bedtime as needed for muscle spasms.     DULoxetine (CYMBALTA) 60 MG capsule Take 60 mg by mouth daily.     gabapentin (NEURONTIN) 300 MG capsule Take 900 mg by mouth at bedtime.     glipiZIDE (GLUCOTROL XL) 10 MG 24 hr tablet Take 10 mg by mouth 2 (two) times daily.     hydroxyurea (HYDREA) 500 MG capsule Take 2 capsules (1,000 mg total) by mouth 4 (four) times a week AND 3 capsules (1,500 mg total) 3 (three) times a week. [2 capsules on Tu/Th/Sa/Su, 3 capsules on M/W/F]. May take with food to minimize GI side effects.. 204 capsule 3   loratadine (CLARITIN) 10  MG tablet Take 10 mg by mouth daily. As needed     metFORMIN (GLUCOPHAGE-XR) 500 MG 24 hr tablet Take 1,000 mg by mouth at bedtime.  6   metoprolol tartrate (LOPRESSOR) 100 MG tablet Take 100 mg by mouth 2 (two) times daily.     Multiple Vitamin (MULTIVITAMIN WITH MINERALS) TABS tablet Take 1 tablet by mouth daily.     omeprazole (PRILOSEC) 40 MG capsule Take 1 capsule (40 mg total) by mouth 2 (two) times daily. 180 capsule 0   tadalafil (CIALIS) 20 MG tablet Take 20 mg by mouth daily as needed for erectile dysfunction.     zinc gluconate 50 MG tablet Take 50 mg by mouth daily.     No current facility-administered medications for this visit.    Review of Systems: GENERAL: negative for malaise, night  sweats HEENT: No changes in hearing or vision, no nose bleeds or other nasal problems. NECK: Negative for lumps, goiter, pain and significant neck swelling RESPIRATORY: Negative for cough, wheezing CARDIOVASCULAR: Negative for chest pain, leg swelling, palpitations, orthopnea GI: SEE HPI MUSCULOSKELETAL: Negative for joint pain or swelling, back pain, and muscle pain. SKIN: Negative for lesions, rash PSYCH: Negative for sleep disturbance, mood disorder and recent psychosocial stressors. HEMATOLOGY Negative for prolonged bleeding, bruising easily, and swollen nodes. ENDOCRINE: Negative for cold or heat intolerance, polyuria, polydipsia and goiter. NEURO: negative for tremor, gait imbalance, syncope and seizures. The remainder of the review of systems is noncontributory.   Physical Exam: No exam was performed as this was a telephone encounter  Imaging/Labs: as above  I personally reviewed and interpreted the available labs, imaging and endoscopic files.  Impression and Plan: Justin Berger is a 71 y.o. male with PMH AAA, DM, colon cancer s/p, essential thrombocytosis, HTN, who presents for follow up of esophageal varices and liver steatohepatitis. Patient has had an extensive investigation to determine the reason behind his esophageal varices and so far all the investigations have excluded any portal hypertension or venous abnormalities, as well as advanced liver fibrosis. He did not tolerate Coreg and was switched back to metoprolol. I discussed with him that besides banding, the only option could be to perform TIPS although this may not have a big role as he does not have portal hypertension and this may be too aggressive. Due to this, we will schedule a repeat EGD soon to continue with repeat banding.  I have discuss with Dr. 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 We agreed on monitoring him for now as he has more severe esophageal issues in  the moment that need to be addressed first.  Finally, we will repeat a colonoscopy in 2027 given his history of colon cancer - will update the timeframe to repeat this procedure in 2025.  - Schedule EGD - Will updated repeat colonoscopy timeframe for 09/2026  All questions were answered.      Total visit time: I spent a total of 25 minutes  Justin Peppers, MD Gastroenterology and Hepatology [facility]

## 2022-01-21 NOTE — Patient Instructions (Signed)
Schedule EGD

## 2022-01-25 ENCOUNTER — Other Ambulatory Visit (INDEPENDENT_AMBULATORY_CARE_PROVIDER_SITE_OTHER): Payer: Self-pay

## 2022-01-25 DIAGNOSIS — I8500 Esophageal varices without bleeding: Secondary | ICD-10-CM

## 2022-01-28 ENCOUNTER — Encounter (INDEPENDENT_AMBULATORY_CARE_PROVIDER_SITE_OTHER): Payer: Self-pay

## 2022-02-01 DIAGNOSIS — E1165 Type 2 diabetes mellitus with hyperglycemia: Secondary | ICD-10-CM | POA: Diagnosis not present

## 2022-02-01 DIAGNOSIS — Z6835 Body mass index (BMI) 35.0-35.9, adult: Secondary | ICD-10-CM | POA: Diagnosis not present

## 2022-02-04 NOTE — Patient Instructions (Signed)
20    Your procedure is scheduled on: 02/08/2022  Report to [place] Entrance at   9:15  AM.  Call this number if you have problems the morning of surgery: [phone]   Remember:   Follow instructions on letter from office regarding when to stop eating and drinking        No Smoking the day of procedure      Take these medicines the morning of surgery with A SIP OF WATER: Cymbalta, Metoprolol, Claritin, and omeprazole   Do not wear jewelry, make-up or nail polish.  Do not wear lotions, powders, or perfumes. You may wear deodorant.                Do not bring valuables to the hospital.  Contacts, dentures or bridgework may not be worn into surgery.  Leave suitcase in the car. After surgery it may be brought to your room.  For patients admitted to the hospital, checkout time is 11:00 AM the day of discharge.   Patients discharged the day of surgery will not be allowed to drive home. Upper Endoscopy, Adult Upper endoscopy is a procedure to look inside the upper GI (gastrointestinal) tract. The upper GI tract is made up of: The part of the body that moves food from your mouth to your stomach (esophagus). The stomach. The first part of your small intestine (duodenum). This procedure is also called esophagogastroduodenoscopy (EGD) or gastroscopy. In this procedure, your health care provider passes a thin, flexible tube (endoscope) through your mouth and down your esophagus into your stomach. A small camera is attached to the end of the tube. Images from the camera appear on a monitor in the exam room. During this procedure, your health care provider may also remove a small piece of tissue to be sent to a lab and examined under a microscope (biopsy). Your health care provider may do an upper endoscopy to diagnose cancers of the upper GI tract. You may also have this procedure to find the cause of other conditions, such as: Stomach pain. Heartburn. Pain or problems when  swallowing. Nausea and vomiting. Stomach bleeding. Stomach ulcers. Tell a health care provider about: Any allergies you have. All medicines you are taking, including vitamins, herbs, eye drops, creams, and over-the-counter medicines. Any problems you or family members have had with anesthetic medicines. Any blood disorders you have. Any surgeries you have had. Any medical conditions you have. Whether you are pregnant or may be pregnant. What are the risks? Generally, this is a safe procedure. However, problems may occur, including: Infection. Bleeding. Allergic reactions to medicines. A tear or hole (perforation) in the esophagus, stomach, or duodenum. What happens before the procedure? Staying hydrated Follow instructions from your health care provider about hydration, which may include: Up to 4 hours before the procedure - you may continue to drink clear liquids, such as water, clear fruit juice, black coffee, and plain tea.   Medicines Ask your health care provider about: Changing or stopping your regular medicines. This is especially important if you are taking diabetes medicines or blood thinners. Taking medicines such as aspirin and ibuprofen. These medicines can thin your blood. Do not take these medicines unless your health care provider tells you to take them. Taking over-the-counter medicines, vitamins, herbs, and supplements. General instructions Plan to have someone take you home from the hospital or clinic. If you will be going home right after the procedure, plan to have someone with you for 24 hours.  Ask your health care provider what steps will be taken to help prevent infection. What happens during the procedure?  An IV will be inserted into one of your veins. You may be given one or more of the following: A medicine to help you relax (sedative). A medicine to numb the throat (local anesthetic). You will lie on your left side on an exam table. Your health care  provider will pass the endoscope through your mouth and down your esophagus. Your health care provider will use the scope to check the inside of your esophagus, stomach, and duodenum. Biopsies may be taken. The endoscope will be removed. The procedure may vary among health care providers and hospitals. What happens after the procedure? Your blood pressure, heart rate, breathing rate, and blood oxygen level will be monitored until you leave the hospital or clinic. Do not drive for 24 hours if you were given a sedative during your procedure. When your throat is no longer numb, you may be given some fluids to drink. It is up to you to get the results of your procedure. Ask your health care provider, or the department that is doing the procedure, when your results will be ready. Summary Upper endoscopy is a procedure to look inside the upper GI tract. During the procedure, an IV will be inserted into one of your veins. You may be given a medicine to help you relax. A medicine will be used to numb your throat. The endoscope will be passed through your mouth and down your esophagus. This information is not intended to replace advice given to you by your health care provider. Make sure you discuss any questions you have with your health care provider. Document Revised: 06/10/2018 Document Reviewed: 05/18/2018 Elsevier Patient Education  [place] After  Please read the instructions outlined below and refer to this sheet in the next few weeks. These discharge instructions provide you with general information on caring for yourself after you leave the hospital. Your doctor may also give you specific instructions. While your treatment has been planned according to the most current medical practices available, unavoidable complications occasionally  occur. If you have any problems or questions after discharge, please call your doctor. HOME CARE INSTRUCTIONS Activity You may resume your regular activity but move at a slower pace for the next 24 hours.  Take frequent rest periods for the next 24 hours.  Walking will help expel (get rid of) the air and reduce the bloated feeling in your abdomen.  No driving for 24 hours (because of the anesthesia (medicine) used during the test).  You may shower.  Do not sign any important legal documents or operate any machinery for 24 hours (because of the  anesthesia used during the test).  Nutrition Drink plenty of fluids.  You may resume your normal diet.  Begin with a light meal and progress to your normal diet.  Avoid alcoholic beverages for 24 hours or as instructed by your caregiver.  Medications You may resume your normal medications unless your caregiver tells you otherwise. What you can expect today You may experience abdominal discomfort such as a feeling of fullness or "gas" pains.  You may experience a sore throat for 2 to 3 days. This is normal. Gargling with salt water may help this.  Follow-up Your doctor will discuss the results of your test with you. SEEK IMMEDIATE MEDICAL CARE IF: You have excessive nausea (feeling sick to your stomach) and/or vomiting.  You have severe abdominal pain and distention (swelling).  You have trouble swallowing.  You have a temperature over 100 F (37.8 C).  You have rectal bleeding or vomiting of blood.  Document Released: 07/30/2004 Document Revised: 12/05/2011 Document Reviewed: 02/10/2008  Esophageal Variceal Ligation, Care After After an esophageal variceal ligation (EVL), it is common to have bleeding, pain and soreness in the chest. You may also have problems swallowing. Follow these instructions at home: Your doctor may give you more specific instructions. If you have problems, call your doctor. Eating and drinking You will have limits on  what you can eat for the first 1-2 days after your procedure. You will start with a liquid diet. Later, you will start to eat soft foods. Do not drink alcohol. You may return to your normal diet after 2 days, if you no longer have trouble swallowing.  Activity Do not lift anything that is heavier than 10 lb (4.5 kg), or the limit that you are told. Do not drive or use heavy machines while taking prescription pain medicine. Return to your normal activities when your doctor says that it is okay. Avoid physical activity that takes a lot of effort. Ask your health care provider what exercises are safe for you. General instructions  Take over-the-counter and prescription medicines as you are told. Take your antibiotic medicine as told by your doctor. Do not stop taking them even if you start to feel better. Do not smoke cigarettes or e-cigarettes. Do not chew tobacco. Ask your doctor if you need help. Keep all follow-up visits. Contact a doctor if: You have chest pain that lasts for more than 3 days after you go home. You have trouble swallowing that lasts for more than 3 days after you go home. You have fever or chills. Get help right away if: You have bleeding from your throat. You have bleeding from your bottom (rectum). You throw up (vomit) bright red blood. You are unable to swallow. You are short of breath. You have very bad chest pain or back pain. These symptoms may be an emergency. Get help right away. Call 911. Do not wait to see if the symptoms will go away. Do not drive yourself to the hospital. Summary After an EVL procedure, it is common to have pain, bleeding, and trouble swallowing. Follow your doctor's home care instructions. Stay on a liquid or soft diet until your doctor says that you can go back to your normal diet. Contact a doctor if you have chills, fever, chest pain, or trouble swallowing. Get help right away if you have bleeding, are unable to swallow, or have  very bad chest or back pain. This information is not intended to replace advice given to you by your health care  provider. Make sure you discuss any questions you have with your health care provider. Document Revised: 04/04/2020 Document Reviewed: 04/04/2020 Elsevier Patient Education  [place].

## 2022-02-06 ENCOUNTER — Encounter (HOSPITAL_COMMUNITY)
Admission: RE | Admit: 2022-02-06 | Discharge: 2022-02-06 | Disposition: A | Discharge status: Home / Self Care | Payer: Medicare Other | Source: Ambulatory Visit | Attending: Gastroenterology | Admitting: Gastroenterology

## 2022-02-06 ENCOUNTER — Encounter (HOSPITAL_COMMUNITY): Payer: Self-pay

## 2022-02-06 VITALS — BP 141/75 | HR 88 | Temp 97.8°F | Resp 18 | Ht 70.0 in | Wt 230.0 lb

## 2022-02-06 DIAGNOSIS — D649 Anemia, unspecified: Secondary | ICD-10-CM | POA: Diagnosis not present

## 2022-02-06 DIAGNOSIS — Z01812 Encounter for preprocedural laboratory examination: Secondary | ICD-10-CM | POA: Insufficient documentation

## 2022-02-06 DIAGNOSIS — I8500 Esophageal varices without bleeding: Secondary | ICD-10-CM | POA: Insufficient documentation

## 2022-02-06 HISTORY — DX: Basal cell carcinoma of skin, unspecified: C44.91

## 2022-02-06 LAB — CBC WITH DIFFERENTIAL/PLATELET
Abs Immature Granulocytes: 0.25 10*3/uL — ABNORMAL HIGH (ref 0.00–0.07)
Basophils Absolute: 0.1 10*3/uL (ref 0.0–0.1)
Basophils Relative: 1 %
Eosinophils Absolute: 0 10*3/uL (ref 0.0–0.5)
Eosinophils Relative: 1 %
HCT: 35 % — ABNORMAL LOW (ref 39.0–52.0)
Hemoglobin: 11.2 g/dL — ABNORMAL LOW (ref 13.0–17.0)
Immature Granulocytes: 5 %
Lymphocytes Relative: 17 %
Lymphs Abs: 0.9 10*3/uL (ref 0.7–4.0)
MCH: 33.5 pg (ref 26.0–34.0)
MCHC: 32 g/dL (ref 30.0–36.0)
MCV: 104.8 fL — ABNORMAL HIGH (ref 80.0–100.0)
Monocytes Absolute: 0.5 10*3/uL (ref 0.1–1.0)
Monocytes Relative: 10 %
Neutro Abs: 3.5 10*3/uL (ref 1.7–7.7)
Neutrophils Relative %: 66 %
Platelets: 459 10*3/uL — ABNORMAL HIGH (ref 150–400)
RBC: 3.34 MIL/uL — ABNORMAL LOW (ref 4.22–5.81)
RDW: 18.7 % — ABNORMAL HIGH (ref 11.5–15.5)
WBC: 5.2 10*3/uL (ref 4.0–10.5)
nRBC: 0.4 % — ABNORMAL HIGH (ref 0.0–0.2)

## 2022-02-06 LAB — COMPREHENSIVE METABOLIC PANEL
ALT: 15 U/L (ref 0–44)
AST: 17 U/L (ref 15–41)
Albumin: 4.4 g/dL (ref 3.5–5.0)
Alkaline Phosphatase: 38 U/L (ref 38–126)
Anion gap: 11 (ref 5–15)
BUN: 19 mg/dL (ref 8–23)
CO2: 19 mmol/L — ABNORMAL LOW (ref 22–32)
Calcium: 9.2 mg/dL (ref 8.9–10.3)
Chloride: 106 mmol/L (ref 98–111)
Creatinine, Ser: 1.03 mg/dL (ref 0.61–1.24)
GFR, Estimated: >60 mL/min (ref >60)
Glucose, Bld: 128 mg/dL — ABNORMAL HIGH (ref 70–99)
Potassium: 3.8 mmol/L (ref 3.5–5.1)
Sodium: 136 mmol/L (ref 135–145)
Total Bilirubin: 0.9 mg/dL (ref 0.3–1.2)
Total Protein: 6.6 g/dL (ref 6.5–8.1)

## 2022-02-06 LAB — PROTIME-INR
INR: 1.1 (ref 0.8–1.2)
Prothrombin Time: 14.6 seconds (ref 11.4–15.2)

## 2022-02-08 ENCOUNTER — Encounter (HOSPITAL_COMMUNITY)
Admission: RE | Disposition: A | Discharge status: Home / Self Care | Payer: Self-pay | Source: Home / Self Care | Attending: Gastroenterology

## 2022-02-08 ENCOUNTER — Ambulatory Visit (HOSPITAL_COMMUNITY): Payer: Medicare Other | Admitting: Anesthesiology

## 2022-02-08 ENCOUNTER — Other Ambulatory Visit: Payer: Self-pay

## 2022-02-08 ENCOUNTER — Ambulatory Visit (HOSPITAL_BASED_OUTPATIENT_CLINIC_OR_DEPARTMENT_OTHER): Payer: Medicare Other | Admitting: Anesthesiology

## 2022-02-08 ENCOUNTER — Encounter (HOSPITAL_COMMUNITY): Payer: Self-pay | Admitting: Gastroenterology

## 2022-02-08 ENCOUNTER — Ambulatory Visit (HOSPITAL_COMMUNITY)
Admission: RE | Admit: 2022-02-08 | Discharge: 2022-02-08 | Disposition: A | Discharge status: Home / Self Care | Payer: Medicare Other | Attending: Gastroenterology | Admitting: Gastroenterology

## 2022-02-08 DIAGNOSIS — Z87891 Personal history of nicotine dependence: Secondary | ICD-10-CM | POA: Diagnosis not present

## 2022-02-08 DIAGNOSIS — E1165 Type 2 diabetes mellitus with hyperglycemia: Secondary | ICD-10-CM | POA: Insufficient documentation

## 2022-02-08 DIAGNOSIS — K219 Gastro-esophageal reflux disease without esophagitis: Secondary | ICD-10-CM | POA: Insufficient documentation

## 2022-02-08 DIAGNOSIS — Z85038 Personal history of other malignant neoplasm of large intestine: Secondary | ICD-10-CM | POA: Diagnosis not present

## 2022-02-08 DIAGNOSIS — Z7984 Long term (current) use of oral hypoglycemic drugs: Secondary | ICD-10-CM | POA: Diagnosis not present

## 2022-02-08 DIAGNOSIS — I8500 Esophageal varices without bleeding: Secondary | ICD-10-CM | POA: Diagnosis not present

## 2022-02-08 DIAGNOSIS — K2289 Other specified disease of esophagus: Secondary | ICD-10-CM

## 2022-02-08 DIAGNOSIS — I10 Essential (primary) hypertension: Secondary | ICD-10-CM | POA: Insufficient documentation

## 2022-02-08 DIAGNOSIS — K317 Polyp of stomach and duodenum: Secondary | ICD-10-CM | POA: Diagnosis not present

## 2022-02-08 DIAGNOSIS — I714 Abdominal aortic aneurysm, without rupture, unspecified: Secondary | ICD-10-CM | POA: Diagnosis not present

## 2022-02-08 DIAGNOSIS — D473 Essential (hemorrhagic) thrombocythemia: Secondary | ICD-10-CM | POA: Insufficient documentation

## 2022-02-08 HISTORY — PX: HEMOSTASIS CLIP PLACEMENT: SHX6857

## 2022-02-08 HISTORY — PX: ESOPHAGOGASTRODUODENOSCOPY (EGD) WITH PROPOFOL: SHX5813

## 2022-02-08 HISTORY — PX: POLYPECTOMY: SHX5525

## 2022-02-08 HISTORY — PX: ESOPHAGEAL BANDING: SHX5518

## 2022-02-08 LAB — GLUCOSE, CAPILLARY: Glucose-Capillary: 141 mg/dL — ABNORMAL HIGH (ref 70–99)

## 2022-02-08 SURGERY — ESOPHAGOGASTRODUODENOSCOPY (EGD) WITH PROPOFOL
Anesthesia: General

## 2022-02-08 MED ORDER — OMEPRAZOLE 40 MG PO CPDR: 40.0000 mg | DELAYED_RELEASE_CAPSULE | Freq: Two times a day (BID) | ORAL | 0 refills | Status: DC

## 2022-02-08 MED ORDER — STERILE WATER FOR IRRIGATION IR SOLN
Status: DC | PRN
  Administered 2022-02-08: 60 mL

## 2022-02-08 MED ORDER — LIDOCAINE HCL (PF) 2 % IJ SOLN
INTRAMUSCULAR | Status: AC
  Filled 2022-02-08: qty 10

## 2022-02-08 MED ORDER — PROPOFOL 500 MG/50ML IV EMUL
INTRAVENOUS | Status: AC
  Filled 2022-02-08: qty 100

## 2022-02-08 MED ORDER — LIDOCAINE HCL 1 % IJ SOLN
INTRAMUSCULAR | Status: DC | PRN
  Administered 2022-02-08: 50 mg via INTRADERMAL

## 2022-02-08 MED ORDER — LACTATED RINGERS IV SOLN: INTRAVENOUS | Status: DC | PRN

## 2022-02-08 MED ORDER — SUCRALFATE 1 G PO TABS: 1.0000 g | ORAL_TABLET | Freq: Two times a day (BID) | ORAL | 0 refills | Status: AC

## 2022-02-08 MED ORDER — PROPOFOL 10 MG/ML IV BOLUS
INTRAVENOUS | Status: DC | PRN
  Administered 2022-02-08 (×3): 20 mg via INTRAVENOUS
  Administered 2022-02-08: 100 mg via INTRAVENOUS
  Administered 2022-02-08 (×2): 30 mg via INTRAVENOUS
  Administered 2022-02-08: 20 mg via INTRAVENOUS
  Administered 2022-02-08: 30 mg via INTRAVENOUS
  Administered 2022-02-08: 50 mg via INTRAVENOUS
  Administered 2022-02-08: 30 mg via INTRAVENOUS

## 2022-02-08 NOTE — Discharge Instructions (Addendum)
You are being discharged to home.  Resume your previous diet.  We are waiting for your pathology results.  Your physician has recommended a repeat upper endoscopy in two months for surveillance.  Continue pantoprazole 40 mg twice a day.

## 2022-02-08 NOTE — Anesthesia Postprocedure Evaluation (Signed)
Anesthesia Post Note  Patient: Justin Berger  Procedure(s) Performed: ESOPHAGOGASTRODUODENOSCOPY (EGD) WITH PROPOFOL POLYPECTOMY HEMOSTASIS CLIP PLACEMENT ESOPHAGEAL BANDING  Patient location during evaluation: Short Stay Anesthesia Type: General Level of consciousness: awake and alert Pain management: pain level controlled Vital Signs Assessment: post-procedure vital signs reviewed and stable Respiratory status: spontaneous breathing Cardiovascular status: blood pressure returned to baseline Postop Assessment: no headache Anesthetic complications: no   No notable events documented.   Last Vitals:  Vitals:   02/08/22 0930 02/08/22 1118  BP: (!) 141/69 128/68  Pulse: 65 66  Resp: 18 (!) 22  Temp: 36.6 C 36.5 C  SpO2: 98% 100%    Last Pain:  Vitals:   02/08/22 1118  TempSrc: Oral  PainSc: 0-No pain                 Jackquelyn Sundberg

## 2022-02-08 NOTE — H&P (Signed)
Justin Berger is an 71 y.o. male.   Chief Complaint: Esophageal varices HPI: Justin Berger is a 71 y.o. male with PMH AAA, DM, colon cancer s/p, essential thrombocytosis, HTN, who presents for follow up of esophageal varices.  Patient denies having any symptoms. The patient denies having any nausea, vomiting, fever, chills, hematochezia, melena, hematemesis, abdominal distention, abdominal pain, diarrhea, jaundice, pruritus or weight loss.  Patient could not tolerate carvedilol given hyperglycemia and uncontrolled hypertension, he was switched back to metoprolol.  Past Medical History:  Diagnosis Date   AAA (abdominal aortic aneurysm) without rupture 01/2021   Colon cancer ([place]) 2003   Diabetes ([place])    Essential thrombocytosis ([place])    Hypertension    Skin cancer, basal cell    back    Past Surgical History:  Procedure Laterality Date   BIOPSY  12/18/2021   Procedure: BIOPSY;  Surgeon: Harvel Quale, MD;  [facility];  Service: Gastroenterology;;   CATARACT EXTRACTION, BILATERAL     COLON SURGERY  06/23/2002   right colectomy   COLONOSCOPY  08/25/2020   Dr. Adelina Mings, normal colon other than venous lake in Left colon   COLONOSCOPY WITH PROPOFOL N/A 10/23/2021   Procedure: COLONOSCOPY WITH PROPOFOL;  Surgeon: Harvel Quale, MD;  [facility];  Service: Gastroenterology;  Laterality: N/A;  12:30   ESOPHAGEAL BANDING  10/23/2021   Procedure: ESOPHAGEAL BANDING;  Surgeon: Montez Morita, Quillian Quince, MD;  [facility];  Service: Gastroenterology;;   ESOPHAGEAL BANDING  12/18/2021   Procedure: ESOPHAGEAL BANDING;  Surgeon: Montez Morita, Quillian Quince, MD;  [facility];  Service: Gastroenterology;;   ESOPHAGOGASTRODUODENOSCOPY  05/25/2021   [facility]: moderate inflammation characterized by adherent blood, erythema, and friability in gastric body, antrum and prepyloric region, as well as 3 colums of grade 2 varices in  the mid esophagus and distal esophagus   ESOPHAGOGASTRODUODENOSCOPY (EGD) WITH PROPOFOL N/A 10/23/2021   Procedure: ESOPHAGOGASTRODUODENOSCOPY (EGD) WITH PROPOFOL;  Surgeon: Harvel Quale, MD;  [facility];  Service: Gastroenterology;  Laterality: N/A;   ESOPHAGOGASTRODUODENOSCOPY (EGD) WITH PROPOFOL N/A 12/18/2021   Procedure: ESOPHAGOGASTRODUODENOSCOPY (EGD) WITH PROPOFOL;  Surgeon: Harvel Quale, MD;  [facility];  Service: Gastroenterology;  Laterality: N/A;  8:05   IR TRANSCATHETER BX  12/03/2021   IR US GUIDE VASC ACCESS RIGHT  12/03/2021   POLYPECTOMY  10/23/2021   Procedure: POLYPECTOMY INTESTINAL;  Surgeon: Harvel Quale, MD;  [facility];  Service: Gastroenterology;;   TONSILLECTOMY      Family History  Problem Relation Age of Onset   Dementia Mother    Cancer Paternal Uncle        lung   Social History:  reports that he quit smoking about 43 years ago. His smoking use included cigarettes. He has a 1.50 pack-year smoking history. He has never used smokeless tobacco. He reports that he does not drink alcohol and does not use drugs.  Allergies:  Allergies  Allergen Reactions   Ivp Dye  [Iodinated Contrast Media]     Heart stopped     Medications Prior to Admission  Medication Sig Dispense Refill   acetaminophen (TYLENOL) 500 MG tablet Take 1,000 mg by mouth every 6 (six) hours as needed for moderate pain or headache.     allopurinol (ZYLOPRIM) 300 MG tablet Take 300 mg by mouth in the morning.     Ascorbic Acid (VITAMIN C PO) Take 2,000 mg by mouth  in the morning.     aspirin 81 MG EC tablet Take 81 mg by mouth in the morning.     atorvastatin (LIPITOR) 10 MG tablet Take 10 mg by mouth every Sunday.     Cholecalciferol (VITAMIN D3 PO) Take 2 capsules by mouth in the morning.     clonazePAM (KLONOPIN) 0.5 MG tablet Take 0.5 mg by mouth daily as needed for anxiety.     cyclobenzaprine (FLEXERIL) 10 MG  tablet Take 10 mg by mouth at bedtime as needed for muscle spasms.     DULoxetine (CYMBALTA) 60 MG capsule Take 60 mg by mouth in the morning.     gabapentin (NEURONTIN) 300 MG capsule Take 900 mg by mouth at bedtime.     glipiZIDE (GLUCOTROL XL) 10 MG 24 hr tablet Take 10 mg by mouth 2 (two) times daily.     hydroxyurea (HYDREA) 500 MG capsule Take 2 capsules (1,000 mg total) by mouth 4 (four) times a week AND 3 capsules (1,500 mg total) 3 (three) times a week. [2 capsules on Tu/Th/Sa/Su, 3 capsules on M/W/F]. May take with food to minimize GI side effects.. 204 capsule 3   loratadine (CLARITIN) 10 MG tablet Take 10 mg by mouth daily as needed for allergies.     metFORMIN (GLUCOPHAGE-XR) 500 MG 24 hr tablet Take 1,000 mg by mouth at bedtime.  6   metoprolol succinate (TOPROL-XL) 100 MG 24 hr tablet Take 100 mg by mouth every evening.     Multiple Vitamin (MULTIVITAMIN WITH MINERALS) TABS tablet Take 1 tablet by mouth in the morning.     omeprazole (PRILOSEC) 40 MG capsule Take 1 capsule (40 mg total) by mouth 2 (two) times daily. 180 capsule 0   tadalafil (CIALIS) 20 MG tablet Take 20 mg by mouth daily as needed for erectile dysfunction.     zinc gluconate 50 MG tablet Take 50 mg by mouth in the morning.     Accu-Chek Softclix Lancets lancets USE TO TEST BLOOD SUGAR DAILY     Blood Glucose Monitoring Suppl (ACCU-CHEK GUIDE ME) w/Device KIT USE TO TEST FASTING SUGAR ONCE DAILY      Results for orders placed or performed during the hospital encounter of 02/06/22 (from the past 48 hour(s))  CBC with Differential/Platelet     Status: Abnormal   Collection Time: 02/06/22  1:38 PM  Result Value Ref Range   WBC 5.2 4.0 - 10.5 K/uL   RBC 3.34 (L) 4.22 - 5.81 MIL/uL   Hemoglobin 11.2 (L) 13.0 - 17.0 g/dL   HCT 35.0 (L) 39.0 - 52.0 %   MCV 104.8 (H) 80.0 - 100.0 fL   MCH 33.5 26.0 - 34.0 pg   MCHC 32.0 30.0 - 36.0 g/dL   RDW 18.7 (H) 11.5 - 15.5 %   Platelets 459 (H) 150 - 400 K/uL   nRBC 0.4 (H)  0.0 - 0.2 %   Neutrophils Relative % 66 %   Neutro Abs 3.5 1.7 - 7.7 K/uL   Lymphocytes Relative 17 %   Lymphs Abs 0.9 0.7 - 4.0 K/uL   Monocytes Relative 10 %   Monocytes Absolute 0.5 0.1 - 1.0 K/uL   Eosinophils Relative 1 %   Eosinophils Absolute 0.0 0.0 - 0.5 K/uL   Basophils Relative 1 %   Basophils Absolute 0.1 0.0 - 0.1 K/uL   Immature Granulocytes 5 %   Abs Immature Granulocytes 0.25 (H) 0.00 - 0.07 K/uL    Comment: Performed at [facility]  [address] [place], [place] [address]  Comprehensive Metabolic Panel     Status: Abnormal   Collection Time: 02/06/22  1:38 PM  Result Value Ref Range   Sodium 136 135 - 145 mmol/L   Potassium 3.8 3.5 - 5.1 mmol/L   Chloride 106 98 - 111 mmol/L   CO2 19 (L) 22 - 32 mmol/L   Glucose, Bld 128 (H) 70 - 99 mg/dL    Comment: Glucose reference range applies only to samples taken after fasting for at least 8 hours.   BUN 19 8 - 23 mg/dL   Creatinine, Ser 1.03 0.61 - 1.24 mg/dL   Calcium 9.2 8.9 - 10.3 mg/dL   Total Protein 6.6 6.5 - 8.1 g/dL   Albumin 4.4 3.5 - 5.0 g/dL   AST 17 15 - 41 U/L   ALT 15 0 - 44 U/L   Alkaline Phosphatase 38 38 - 126 U/L   Total Bilirubin 0.9 0.3 - 1.2 mg/dL   GFR, Estimated >60 >60 mL/min    Comment: (NOTE) Calculated using the CKD-EPI Creatinine Equation (2021)    Anion gap 11 5 - 15    Comment: Performed at [facility], [address], [place], [place] [address]  Protime - INR     Status: None   Collection Time: 02/06/22  1:38 PM  Result Value Ref Range   Prothrombin Time 14.6 11.4 - 15.2 seconds   INR 1.1 0.8 - 1.2    Comment: (NOTE) INR goal varies based on device and disease states. Performed at [facility], [address], [place], [place] [address]    No results found.  Review of Systems  Constitutional: Negative.   HENT: Negative.    Eyes: Negative.   Respiratory: Negative.    Cardiovascular: Negative.   Gastrointestinal: Negative.   Endocrine: Negative.   Genitourinary:  Negative.   Musculoskeletal: Negative.   Skin: Negative.   Allergic/Immunologic: Negative.   Neurological: Negative.   Hematological: Negative.   Psychiatric/Behavioral: Negative.     Blood pressure (!) 141/69, pulse 65, temperature 97.9 F (36.6 C), temperature source Oral, resp. rate 18, SpO2 98 %. Physical Exam  GENERAL: The patient is AO x3, in no acute distress. HEENT: Head is normocephalic and atraumatic. EOMI are intact. Mouth is well hydrated and without lesions. NECK: Supple. No masses LUNGS: Clear to auscultation. No presence of rhonchi/wheezing/rales. Adequate chest expansion HEART: RRR, normal s1 and s2. ABDOMEN: Soft, nontender, no guarding, no peritoneal signs, and nondistended. BS +. No masses. EXTREMITIES: Without any cyanosis, clubbing, rash, lesions or edema. NEUROLOGIC: AOx3, no focal motor deficit. SKIN: no jaundice, no rashes  Assessment/Plan MARCH STEYER is a 71 y.o. male with PMH AAA, DM, colon cancer s/p, essential thrombocytosis, HTN, who presents for follow up of esophageal varices.  We will proceed with EGD with possible banding of varices.  Harvel Quale, MD 02/08/2022, 9:59 AM

## 2022-02-08 NOTE — Op Note (Signed)
[facility] Patient Name: Justin Berger Procedure Date: 02/08/2022 10:36 AM MRN: [id number] Date of Birth: 02-21-1951 Attending MD: Maylon Peppers ,  CSN: [id number] Age: 71 Admit Type: Outpatient Procedure:                Upper GI endoscopy Indications:              Follow-up of esophageal varices Providers:                Maylon Peppers, Janeece Riggers, RN, Hughie Closs RN,                            RN, Rosina Lowenstein, RN Referring MD:              Medicines:                Monitored Anesthesia Care Complications:            No immediate complications. Estimated Blood Loss:     Estimated blood loss: none. Procedure:                Pre-Anesthesia Assessment:                           - Prior to the procedure, a History and Physical                            was performed, and patient medications, allergies                            and sensitivities were reviewed. The patient's                            tolerance of previous anesthesia was reviewed.                           - The risks and benefits of the procedure and the                            sedation options and risks were discussed with the                            patient. All questions were answered and informed                            consent was obtained.                           - ASA Grade Assessment: II - A patient with mild                            systemic disease.                           After obtaining informed consent, the endoscope was                            passed under direct vision. Throughout  the                            procedure, the patient's blood pressure, pulse, and                            oxygen saturations were monitored continuously. The                            GIF-H190 ([id number]) scope was introduced through the                            mouth, and advanced to the second part of duodenum.                            The upper GI endoscopy was accomplished without                             difficulty. The patient tolerated the procedure                            well. Scope In: 10:50:56 AM Scope Out: 11:10:44 AM Total Procedure Duration: 0 hours 19 minutes 48 seconds  Findings:      A single 4 mm yellow tanned submucosal nodule was found in the upper       third of the esophagus.      Grade II varices were found in the entire esophagus. Four bands were       successfully placed with complete eradication, resulting in deflation of       varices. There was no bleeding during the procedure.      Three 5 to 10 mm sessile polyps with no bleeding were found in the       gastric antrum - they had an inflammatory appearance. These polyps were       removed with a hot snare. Resection and retrieval were complete via Jabier Mutton       net. To close a defect after polypectomy of largest polyp, one       hemostatic clip was successfully placed. There was no bleeding at the       end of the procedure.      Mucosal changes characterized by prominent focalized veins were found on       the anterior wall of the stomach at the level of the antrum, but not       typical for varices.      The examined duodenum was normal. Impression:               - Submucosal nodule found in the esophagus.                           - Grade II esophageal varices. Completely                            eradicated. Banded.                           - Three gastric polyps. Resected and retrieved.  Clip was placed.                           - Prominent veins mucosa in the anterior wall of                            the stomach.                           - Normal examined duodenum. Moderate Sedation:      Per Anesthesia Care Recommendation:           - Discharge patient to home (ambulatory).                           - Resume previous diet.                           - Await pathology results.                           - Repeat upper endoscopy in 2 months for                             surveillance. Procedure Code(s):        --- Professional ---                           [phone], Esophagogastroduodenoscopy, flexible,                            transoral; with band ligation of esophageal/gastric                            varices                           43251, Esophagogastroduodenoscopy, flexible,                            transoral; with removal of tumor(s), polyp(s), or                            other lesion(s) by snare technique Diagnosis Code(s):        --- Professional ---                           K22.8, Other specified diseases of esophagus                           I85.00, Esophageal varices without bleeding                           K31.7, Polyp of stomach and duodenum CPT copyright 2019 American [employer] Association. All rights reserved. The codes documented in this report are preliminary and upon coder review may  be revised to meet current compliance requirements. Maylon Peppers, MD Maylon Peppers,  02/08/2022 11:17:32 AM This report has been signed electronically. Number of Addenda: 0

## 2022-02-08 NOTE — Transfer of Care (Signed)
Immediate Anesthesia Transfer of Care Note  Patient: Justin Berger  Procedure(s) Performed: ESOPHAGOGASTRODUODENOSCOPY (EGD) WITH PROPOFOL POLYPECTOMY HEMOSTASIS CLIP PLACEMENT ESOPHAGEAL BANDING  Patient Location: Short Stay  Anesthesia Type:General  Level of Consciousness: awake  Airway & Oxygen Therapy: Patient Spontanous Breathing  Post-op Assessment: Report given to RN  Post vital signs: Reviewed and stable  Last Vitals:  Vitals Value Taken Time  BP    Temp    Pulse    Resp    SpO2      Last Pain:  Vitals:   02/08/22 0930  TempSrc: Oral  PainSc: 0-No pain         Complications: No notable events documented.

## 2022-02-08 NOTE — Anesthesia Preprocedure Evaluation (Signed)
Anesthesia Evaluation  Patient identified by MRN, date of birth, ID band Patient awake    Reviewed: Allergy & Precautions, H&P , NPO status , Patient's Chart, lab work & pertinent test results, reviewed documented beta blocker date and time   Airway Mallampati: II  TM Distance: >3 FB Neck ROM: full    Dental no notable dental hx.    Pulmonary neg pulmonary ROS, former smoker,    Pulmonary exam normal breath sounds clear to auscultation       Cardiovascular Exercise Tolerance: Good hypertension, negative cardio ROS   Rhythm:regular Rate:Normal     Neuro/Psych negative neurological ROS  negative psych ROS   GI/Hepatic Neg liver ROS, GERD  Medicated,  Endo/Other  negative endocrine ROSdiabetes, Poorly Controlled, Type 2  Renal/GU negative Renal ROS  negative genitourinary   Musculoskeletal   Abdominal   Peds  Hematology  (+) Blood dyscrasia, anemia ,   Anesthesia Other Findings   Reproductive/Obstetrics negative OB ROS                             Anesthesia Physical Anesthesia Plan  ASA: 3  Anesthesia Plan: General   Post-op Pain Management:    Induction:   PONV Risk Score and Plan: Propofol infusion  Airway Management Planned:   Additional Equipment:   Intra-op Plan:   Post-operative Plan:   Informed Consent: I have reviewed the patients History and Physical, chart, labs and discussed the procedure including the risks, benefits and alternatives for the proposed anesthesia with the patient or authorized representative who has indicated his/her understanding and acceptance.     Dental Advisory Given  Plan Discussed with: CRNA  Anesthesia Plan Comments:         Anesthesia Quick Evaluation

## 2022-02-11 LAB — SURGICAL PATHOLOGY

## 2022-02-11 NOTE — Progress Notes (Signed)
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  He was last seen by Tarri Abernethy PA-C on 11/05/2021.   At today's visit, he reports feeling fair .   Since his last visit, he has been seeing gastroenterology for work-up of esophageal varices and fatty liver disease versus cirrhosis.      He has not noticed any recent melena, hematochezia, or epistaxis.  He does continue to have some mild to moderate fatigue and intermittent lightheadedness.  He denies any pica, restless legs, headaches, chest pain, dyspnea on exertion, or syncope.    No current signs or symptoms of blood clots such as unilateral leg swelling, new onset chest pain, dyspnea, or hemoptysis.  He denies any aquagenic pruritus or erythromelalgia, but does report that he has intermittent bluish discoloration of his palms especially after showering, which is nonpainful.  Chronic neuropathy at baseline.  He has some occasional dizziness.  No other vasomotor symptoms such as tinnitus, blurry vision, or strokelike symptoms.  No B symptoms such as fever, chills, night sweats, unintentional weight loss.  He denies any abdominal pain, nausea, early satiety.   He is tolerating Hydrea well.  He has some chronic diarrhea, but denies any other GI symptoms.  No cutaneous ulcers, nonhealing wounds, or mouth sores.  He has 60% energy and 80% appetite. He endorses that he is maintaining a stable weight.   REVIEW OF SYSTEMS:  Review of Systems  Constitutional:  Positive for  fatigue. Negative for appetite change, chills, diaphoresis, fever and unexpected weight change.  HENT:   Positive for sore throat. Negative for lump/mass and nosebleeds.   Eyes:  Negative for eye problems.  Respiratory:  Negative for cough, hemoptysis and shortness of breath.   Cardiovascular:  Negative for chest pain, leg swelling and palpitations.  Gastrointestinal:  Positive for diarrhea. Negative for abdominal pain, blood in stool, constipation, nausea and vomiting.  Genitourinary:  Negative for hematuria.   Musculoskeletal:  Positive for gait problem.  Skin: Negative.   Neurological:  Positive for dizziness, gait problem and light-headedness. Negative for headaches.  Hematological:  Does not bruise/bleed easily.     PAST MEDICAL/SURGICAL HISTORY:  Past Medical History:  Diagnosis Date   AAA (abdominal aortic aneurysm) without rupture 01/2021   Colon cancer ([place]) 2003   Diabetes ([place])    Essential thrombocytosis ([place])    Hypertension    Skin cancer, basal cell    back   Past Surgical History:  Procedure Laterality Date   BIOPSY  12/18/2021   Procedure: BIOPSY;  Surgeon: Harvel Quale, MD;  [facility];  Service: Gastroenterology;;   CATARACT EXTRACTION, BILATERAL     COLON SURGERY  06/23/2002   right colectomy   COLONOSCOPY  08/25/2020   Dr. Adelina Mings, normal colon other than venous lake in Left colon   COLONOSCOPY WITH PROPOFOL N/A 10/23/2021   Procedure: COLONOSCOPY WITH PROPOFOL;  Surgeon: Harvel Quale, MD;  [facility];  Service: Gastroenterology;  Laterality: N/A;  12:30  ESOPHAGEAL BANDING  10/23/2021   Procedure: ESOPHAGEAL BANDING;  Surgeon: Montez Morita, Quillian Quince, MD;  [facility];  Service: Gastroenterology;;   ESOPHAGEAL BANDING  12/18/2021   Procedure: ESOPHAGEAL BANDING;  Surgeon: Montez Morita, Quillian Quince, MD;  [facility];  Service: Gastroenterology;;    ESOPHAGOGASTRODUODENOSCOPY  05/25/2021   [facility]: moderate inflammation characterized by adherent blood, erythema, and friability in gastric body, antrum and prepyloric region, as well as 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   ESOPHAGOGASTRODUODENOSCOPY (EGD) WITH PROPOFOL N/A 10/23/2021   Procedure: ESOPHAGOGASTRODUODENOSCOPY (EGD) WITH PROPOFOL;  Surgeon: Harvel Quale, MD;  [facility];  Service: Gastroenterology;  Laterality: N/A;   ESOPHAGOGASTRODUODENOSCOPY (EGD) WITH PROPOFOL N/A 12/18/2021   Procedure: ESOPHAGOGASTRODUODENOSCOPY (EGD) WITH PROPOFOL;  Surgeon: Harvel Quale, MD;  [facility];  Service: Gastroenterology;  Laterality: N/A;  8:05   IR TRANSCATHETER BX  12/03/2021   IR US GUIDE VASC ACCESS RIGHT  12/03/2021   POLYPECTOMY  10/23/2021   Procedure: POLYPECTOMY INTESTINAL;  Surgeon: Montez Morita, Quillian Quince, MD;  [facility];  Service: Gastroenterology;;   TONSILLECTOMY       SOCIAL HISTORY:  Social History   Socioeconomic History   Marital status: Married    Spouse name: Not on file   Number of children: Not on file   Years of education: Not on file   Highest education level: Not on file  Occupational History   Not on file  Tobacco Use   Smoking status: Former    Packs/day: 0.50    Years: 3.00    Pack years: 1.50    Types: Cigarettes    Quit date: 04/08/1978    Years since quitting: 43.8   Smokeless tobacco: Never  Vaping Use   Vaping Use: Never used  Substance and Sexual Activity   Alcohol use: Never   Drug use: Never   Sexual activity: Not on file  Other Topics Concern   Not on file  Social History Narrative   Not on file   Social Determinants of Health   Financial Resource Strain: Not on file  Food Insecurity: Not on file  Transportation Needs: Not on file  Physical Activity: Not on file  Stress: Not on file  Social Connections: Not on file  Intimate  Partner Violence: Not on file    FAMILY HISTORY:  Family History  Problem Relation Age of Onset   Dementia Mother    Cancer Paternal Uncle        lung    CURRENT MEDICATIONS:  Outpatient Encounter Medications as of 02/12/2022  Medication Sig   Accu-Chek Softclix Lancets lancets USE TO TEST BLOOD SUGAR DAILY   acetaminophen (TYLENOL) 500 MG tablet Take 1,000 mg by mouth every 6 (six) hours as needed for moderate pain or headache.   allopurinol (ZYLOPRIM) 300 MG tablet Take 300 mg by mouth in the morning.   Ascorbic Acid (VITAMIN C PO) Take 2,000 mg by mouth in the morning.   aspirin 81 MG EC tablet Take 81 mg by mouth in the morning.   atorvastatin (LIPITOR) 10 MG tablet Take 10 mg by mouth every Sunday.   Blood Glucose Monitoring Suppl (ACCU-CHEK GUIDE ME) w/Device KIT USE TO TEST FASTING SUGAR ONCE DAILY   Cholecalciferol (VITAMIN D3 PO) Take 2 capsules by mouth in the morning.   clonazePAM (KLONOPIN) 0.5 MG tablet Take 0.5 mg by mouth daily as needed for anxiety.   cyclobenzaprine (  FLEXERIL) 10 MG tablet Take 10 mg by mouth at bedtime as needed for muscle spasms.   DULoxetine (CYMBALTA) 60 MG capsule Take 60 mg by mouth in the morning.   gabapentin (NEURONTIN) 300 MG capsule Take 900 mg by mouth at bedtime.   glipiZIDE (GLUCOTROL XL) 10 MG 24 hr tablet Take 10 mg by mouth 2 (two) times daily.   hydroxyurea (HYDREA) 500 MG capsule Take 2 capsules (1,000 mg total) by mouth 4 (four) times a week AND 3 capsules (1,500 mg total) 3 (three) times a week. [2 capsules on Tu/Th/Sa/Su, 3 capsules on M/W/F]. May take with food to minimize GI side effects.[name]   loratadine (CLARITIN) 10 MG tablet Take 10 mg by mouth daily as needed for allergies.   metFORMIN (GLUCOPHAGE-XR) 500 MG 24 hr tablet Take 1,000 mg by mouth at bedtime.   metoprolol succinate (TOPROL-XL) 100 MG 24 hr tablet Take 100 mg by mouth every evening.   Multiple Vitamin (MULTIVITAMIN WITH MINERALS) TABS tablet Take 1 tablet by mouth  in the morning.   omeprazole (PRILOSEC) 40 MG capsule Take 1 capsule (40 mg total) by mouth 2 (two) times daily.   sucralfate (CARAFATE) 1 g tablet Take 1 tablet (1 g total) by mouth 2 (two) times daily.   tadalafil (CIALIS) 20 MG tablet Take 20 mg by mouth daily as needed for erectile dysfunction.   zinc gluconate 50 MG tablet Take 50 mg by mouth in the morning.   No facility-administered encounter medications on file as of 02/12/2022.    ALLERGIES:  Allergies  Allergen Reactions   Ivp Dye  [Iodinated Contrast Media]     Heart stopped      PHYSICAL EXAM:  ECOG PERFORMANCE STATUS: 1 - Symptomatic but completely ambulatory  There were no vitals filed for this visit. There were no vitals filed for this visit. Physical Exam Constitutional:      Appearance: Normal appearance. He is obese.  HENT:     Head: Normocephalic and atraumatic.     Mouth/Throat:     Mouth: Mucous membranes are moist.  Eyes:     Extraocular Movements: Extraocular movements intact.     Pupils: Pupils are equal, round, and reactive to light.  Cardiovascular:     Rate and Rhythm: Normal rate and regular rhythm.     Pulses: Normal pulses.     Heart sounds: Normal heart sounds.  Pulmonary:     Effort: Pulmonary effort is normal.     Breath sounds: Normal breath sounds.  Abdominal:     General: Abdomen is protuberant. Bowel sounds are normal.     Palpations: Abdomen is soft.     Tenderness: There is no abdominal tenderness.  Musculoskeletal:        General: No swelling.     Right lower leg: No edema.     Left lower leg: No edema.  Lymphadenopathy:     Cervical: No cervical adenopathy.  Skin:    General: Skin is warm and dry.  Neurological:     General: No focal deficit present.     Mental Status: He is alert and oriented to person, place, and time.  Psychiatric:        Mood and Affect: Mood normal.        Behavior: Behavior normal.     LABORATORY DATA:  I have reviewed the labs as listed.   CBC    Component Value Date/Time   WBC 5.2 02/06/2022 1338   RBC 3.34 (L) 02/06/2022  1338   HGB 11.2 (L) 02/06/2022 1338   HCT 35.0 (L) 02/06/2022 1338   PLT 459 (H) 02/06/2022 1338   MCV 104.8 (H) 02/06/2022 1338   MCH 33.5 02/06/2022 1338   MCHC 32.0 02/06/2022 1338   RDW 18.7 (H) 02/06/2022 1338   LYMPHSABS 0.9 02/06/2022 1338   MONOABS 0.5 02/06/2022 1338   EOSABS 0.0 02/06/2022 1338   BASOSABS 0.1 02/06/2022 1338   CMP Latest Ref Rng & Units 02/06/2022 11/05/2021 09/05/2021  Glucose 70 - 99 mg/dL 128(H) 166(H) 153(H)  BUN 8 - 23 mg/dL 19 12 23   Creatinine 0.61 - 1.24 mg/dL 1.03 0.99 1.17  Sodium 135 - 145 mmol/L 136 138 138  Potassium 3.5 - 5.1 mmol/L 3.8 3.9 4.4  Chloride 98 - 111 mmol/L 106 105 104  CO2 22 - 32 mmol/L 19(L) 25 25  Calcium 8.9 - 10.3 mg/dL 9.2 9.0 9.2  Total Protein 6.5 - 8.1 g/dL 6.6 6.3(L) 6.5  Total Bilirubin 0.3 - 1.2 mg/dL 0.9 0.7 0.8  Alkaline Phos 38 - 126 U/L 38 42 44  AST 15 - 41 U/L 17 13(L) 12(L)  ALT 0 - 44 U/L 15 14 19     DIAGNOSTIC IMAGING:  I have independently reviewed the relevant imaging and discussed with the patient.  ASSESSMENT & PLAN: 1.  Essential thrombocytosis, CALR+: - High risk disease with history of thrombosis (left leg DVT) and age > 47 - Bone marrow biopsy 12/10/2017 by Dr. Delton Coombes at [facility] in [place], [place] cyt[place] for leukemia/lymphoma was non-diagnostic: "No significant diagnostic immunophenotypic abnormality detected." Pathology review showed 60% cellularity with significant megakaryocytic hyperplasia with clusters of megakaryocytes; reticulin stain shows slight (Grade 1) reticulin fibrosis, some centered around areas of megakaryocyte clusters Cytogenic analysis showed normal male karyotype 46,XY[20] Noted to be at risk for progression to post essential thrombocythemia related myelofibrosis, but peripheral blood did not show other features of myelofibrosis, such as leukoerythroblastic blood  or atypical megakaryocyte morphology - Intermittent bluish discoloration of bilateral palms, nonpainful.  No aquagenic pruritus or erythromelalgias.  Mild vasomotor symptoms such as intermittent blurry vision; no tinnitus, headache, strokelike symptoms.  - Hydroxyurea started in December 2018 - current dose 1000 mg TTS S and 1500 mg MWF. - He is on aspirin 81 mg daily. - He is tolerating Hydrea well without side effects apart from chronic mild diarrhea.  - CT abdomen and pelvis on 09/29/2017 showed splenomegaly with spleen 14.5 cm.  Abdominal ultrasound (11/02/2021): Stable splenomegaly with spleen 14.3 cm.  MRI abdomen (11/20/2021): No splenomegaly noted. - No palpable adenopathy or splenomegaly on exam  - Labs today (02/12/2022): Platelets 504, Hgb 11.4/MCV 103.7, WBC 5.4 with normal differential.  LDH 194.  CMP at baseline. - Goal is platelets < 500 - PLAN: We will slightly increase his Hydrea to 1000 mg TTSun and 1500 mg MWFSat.   - Continue aspirin 81 mg daily. - Repeat labs and RTC in 3 months.   2.  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 - Hemoccult stool x3 were positive - EGD (05/25/2021 at [facility]): Moderate inflammation characterized by adherent blood, erythema, and friability in the gastric body, antrum, and prepyloric region, as well  as 3 columns of 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 and distal esophagus.  (Currently being worked up by Dr. Jenetta Downer).  - EGD (10/23/2021): Grade 2 varices s/p banding - Colonoscopy (10/23/2021): Internal and external hemorrhoids, patent ileocolonic anastomosis, polyp x1 - Most recent EGD (02/08/2022): Grade 2 varices in the entire esophagus, s/p  banding; 3 gastric polyps; prominent veins mucosa in the anterior wall of the stomach - Laboratory work-up otherwise revealed LDH was slightly elevated at 245.  Erythropoietin was elevated at 67.2.  Reticulocytes 2.9%, with reticulocyte index < 2 indicating hypoproliferation.  Peripheral smear showed macrocytic anemia, neutrophilic left shift, and thrombocytosis.  SPEP/IFE were normal.  Hemolysis labs unremarkable. - Differential diagnosis favors multifactorial anemia due to chronic GI blood loss, myelosuppressive effects of hydroxyurea, and anemia related to chronic disease - Most recent labs (02/12/2022): Hgb 11.4/MCV 103.7.  Ferritin 234, iron saturation 22% normal B12.  Mild folate deficiency noted with folic acid 5.3. - Denies any gross signs or symptoms of blood loss such as hematemesis, hematochezia, or melena    - Symptomatic with ongoing chronic fatigue    - PLAN: No indication for IV iron at this time.  . -  Recommend continued follow-up with gastroenterology for suspected GI blood loss and liver disease. - Continue current dose of Hydrea as above.  Would consider decrease dose if Hgb less than 10. - Recommend the patient start taking folic acid supplement 201 mcg daily   - Repeat CBC and iron studies in 3 months.     3. Personal history of colon cancer - Patient reports history of stage II colon cancer in ~ 2004 - Underwent right hemicolectomy and chemotherapy with Leukovorin and 5FU - Most recent colonoscopy in August 2021 was normal, due for repeat colonoscopy in 5 years   4. 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 The patient knows to call the clinic with any problems, questions or concerns.  Medical decision making: Moderate  Time spent on visit: I spent 20 minutes counseling the patient face to face. The total time spent in  the appointment was 30 minutes and more than 50% was on counseling.   Harriett Rush, PA-C  02/12/2022 12:25 PM

## 2022-02-12 ENCOUNTER — Other Ambulatory Visit: Payer: Self-pay

## 2022-02-12 ENCOUNTER — Encounter (HOSPITAL_COMMUNITY): Payer: Self-pay | Admitting: Gastroenterology

## 2022-02-12 ENCOUNTER — Inpatient Hospital Stay (HOSPITAL_COMMUNITY): Payer: Medicare Other | Attending: Physician Assistant | Admitting: Physician Assistant

## 2022-02-12 ENCOUNTER — Inpatient Hospital Stay (HOSPITAL_COMMUNITY): Payer: Medicare Other

## 2022-02-12 VITALS — BP 130/83 | HR 72 | Temp 96.9°F | Resp 18 | Ht 69.75 in | Wt 235.0 lb

## 2022-02-12 DIAGNOSIS — R42 Dizziness and giddiness: Secondary | ICD-10-CM | POA: Diagnosis not present

## 2022-02-12 DIAGNOSIS — D473 Essential (hemorrhagic) thrombocythemia: Secondary | ICD-10-CM | POA: Diagnosis not present

## 2022-02-12 DIAGNOSIS — Z86718 Personal history of other venous thrombosis and embolism: Secondary | ICD-10-CM | POA: Insufficient documentation

## 2022-02-12 DIAGNOSIS — Z79899 Other long term (current) drug therapy: Secondary | ICD-10-CM | POA: Insufficient documentation

## 2022-02-12 DIAGNOSIS — D500 Iron deficiency anemia secondary to blood loss (chronic): Secondary | ICD-10-CM | POA: Insufficient documentation

## 2022-02-12 DIAGNOSIS — K922 Gastrointestinal hemorrhage, unspecified: Secondary | ICD-10-CM | POA: Diagnosis not present

## 2022-02-12 DIAGNOSIS — R5383 Other fatigue: Secondary | ICD-10-CM | POA: Insufficient documentation

## 2022-02-12 DIAGNOSIS — Z87891 Personal history of nicotine dependence: Secondary | ICD-10-CM | POA: Insufficient documentation

## 2022-02-12 DIAGNOSIS — D539 Nutritional anemia, unspecified: Secondary | ICD-10-CM

## 2022-02-12 DIAGNOSIS — Z7984 Long term (current) use of oral hypoglycemic drugs: Secondary | ICD-10-CM | POA: Diagnosis not present

## 2022-02-12 DIAGNOSIS — R197 Diarrhea, unspecified: Secondary | ICD-10-CM | POA: Diagnosis not present

## 2022-02-12 DIAGNOSIS — E538 Deficiency of other specified B group vitamins: Secondary | ICD-10-CM | POA: Diagnosis not present

## 2022-02-12 DIAGNOSIS — Z85038 Personal history of other malignant neoplasm of large intestine: Secondary | ICD-10-CM | POA: Insufficient documentation

## 2022-02-12 DIAGNOSIS — Z85820 Personal history of malignant melanoma of skin: Secondary | ICD-10-CM | POA: Insufficient documentation

## 2022-02-12 DIAGNOSIS — Z7982 Long term (current) use of aspirin: Secondary | ICD-10-CM | POA: Insufficient documentation

## 2022-02-12 LAB — CBC WITH DIFFERENTIAL/PLATELET
Abs Immature Granulocytes: 0.14 10*3/uL — ABNORMAL HIGH (ref 0.00–0.07)
Basophils Absolute: 0.1 10*3/uL (ref 0.0–0.1)
Basophils Relative: 1 %
Eosinophils Absolute: 0.1 10*3/uL (ref 0.0–0.5)
Eosinophils Relative: 1 %
HCT: 36 % — ABNORMAL LOW (ref 39.0–52.0)
Hemoglobin: 11.4 g/dL — ABNORMAL LOW (ref 13.0–17.0)
Immature Granulocytes: 3 %
Lymphocytes Relative: 15 %
Lymphs Abs: 0.8 10*3/uL (ref 0.7–4.0)
MCH: 32.9 pg (ref 26.0–34.0)
MCHC: 31.7 g/dL (ref 30.0–36.0)
MCV: 103.7 fL — ABNORMAL HIGH (ref 80.0–100.0)
Monocytes Absolute: 0.5 10*3/uL (ref 0.1–1.0)
Monocytes Relative: 10 %
Neutro Abs: 3.8 10*3/uL (ref 1.7–7.7)
Neutrophils Relative %: 70 %
Platelets: 504 10*3/uL — ABNORMAL HIGH (ref 150–400)
RBC: 3.47 MIL/uL — ABNORMAL LOW (ref 4.22–5.81)
RDW: 18.5 % — ABNORMAL HIGH (ref 11.5–15.5)
WBC: 5.4 10*3/uL (ref 4.0–10.5)
nRBC: 0 % (ref 0.0–0.2)

## 2022-02-12 LAB — IRON AND TIBC
Iron: 90 ug/dL (ref 45–182)
Saturation Ratios: 22 % (ref 17.9–39.5)
TIBC: 401 ug/dL (ref 250–450)
UIBC: 311 ug/dL

## 2022-02-12 LAB — COMPREHENSIVE METABOLIC PANEL
ALT: 17 U/L (ref 0–44)
AST: 14 U/L — ABNORMAL LOW (ref 15–41)
Albumin: 4.3 g/dL (ref 3.5–5.0)
Alkaline Phosphatase: 49 U/L (ref 38–126)
Anion gap: 12 (ref 5–15)
BUN: 16 mg/dL (ref 8–23)
CO2: 21 mmol/L — ABNORMAL LOW (ref 22–32)
Calcium: 9.2 mg/dL (ref 8.9–10.3)
Chloride: 104 mmol/L (ref 98–111)
Creatinine, Ser: 1.03 mg/dL (ref 0.61–1.24)
GFR, Estimated: >60 mL/min (ref >60)
Glucose, Bld: 165 mg/dL — ABNORMAL HIGH (ref 70–99)
Potassium: 4 mmol/L (ref 3.5–5.1)
Sodium: 137 mmol/L (ref 135–145)
Total Bilirubin: 0.7 mg/dL (ref 0.3–1.2)
Total Protein: 6.7 g/dL (ref 6.5–8.1)

## 2022-02-12 LAB — VITAMIN B12: Vitamin B-12: 287 pg/mL (ref 180–914)

## 2022-02-12 LAB — LACTATE DEHYDROGENASE: LDH: 194 U/L — ABNORMAL HIGH (ref 98–192)

## 2022-02-12 LAB — FOLATE: Folate: 5.3 ng/mL — ABNORMAL LOW (ref >5.9)

## 2022-02-12 LAB — FERRITIN: Ferritin: 234 ng/mL (ref 24–336)

## 2022-02-12 NOTE — Patient Instructions (Signed)
[place] at [facility] Discharge Instructions  You were seen today by Tarri Abernethy PA-C for your elevated platelets (essential thrombocytosis) and your anemia.  Your platelets are somewhat higher than usual, so we will slightly increase your dose of Hydrea.   Take 3 tablets (1500 mg) on Monday, Wednesday, Friday, Saturday Take 2 tablets (1000 mg) on Tuesday, Thursday, Sunday  Continue to take your aspirin 81 mg daily.  You should START taking over-the-counter folic acid 735 mcg daily.  You continue to have mild anemia, which may be related to your Hydrea or related to possible blood loss from esophageal varices.  We will continue to monitor your blood counts, but for the time being they are good enough to continue your Hydrea.  Continue follow-up with GI doctors regarding esophageal varices and liver disease.  LABS: Return in 3 months for repeat labs  FOLLOW-UP APPOINTMENT: Office visit in 3 months   Thank you for choosing [place] at [facility] to provide your oncology and hematology care.  To afford each patient quality time with our provider, please arrive at least 15 minutes before your scheduled appointment time.   If you have a lab appointment with the [facility] please come in thru the Main Entrance and check in at the main information desk.  You need to re-schedule your appointment should you arrive 10 or more minutes late.  We strive to give you quality time with our providers, and arriving late affects you and other patients whose appointments are after yours.  Also, if you no show three or more times for appointments you may be dismissed from the clinic at the providers discretion.     Again, thank you for choosing [facility].  Our hope is that these requests will decrease the amount of time that you wait before being seen by our physicians.        _____________________________________________________________  Should you have questions after your visit to [facility], please contact our office at [phone] and follow the prompts.  Our office hours are 8:00 a.m. and 4:30 p.m. Monday - Friday.  Please note that voicemails left after 4:00 p.m. may not be returned until the following business day.  We are closed weekends and major holidays.  You do have access to a nurse 24-7, just call the main number to the clinic [phone] and do not press any options, hold on the line and a nurse will answer the phone.    For prescription refill requests, have your pharmacy contact our office and allow 72 hours.    Due to Covid, you will need to wear a mask upon entering the hospital. If you do not have a mask, a mask will be given to you at the Main Entrance upon arrival. For doctor visits, patients may have 1 support person age 84 or older with them. For treatment visits, patients can not have anyone with them due to social distancing guidelines and our immunocompromised population.

## 2022-02-13 DIAGNOSIS — R509 Fever, unspecified: Secondary | ICD-10-CM | POA: Diagnosis not present

## 2022-02-13 DIAGNOSIS — Z6834 Body mass index (BMI) 34.0-34.9, adult: Secondary | ICD-10-CM | POA: Diagnosis not present

## 2022-02-13 DIAGNOSIS — R059 Cough, unspecified: Secondary | ICD-10-CM | POA: Diagnosis not present

## 2022-02-13 DIAGNOSIS — Z20828 Contact with and (suspected) exposure to other viral communicable diseases: Secondary | ICD-10-CM | POA: Diagnosis not present

## 2022-02-13 LAB — HOMOCYSTEINE: Homocysteine: 13.1 umol/L (ref 0.0–19.2)

## 2022-02-19 LAB — METHYLMALONIC ACID, SERUM: Methylmalonic Acid, Quantitative: 281 nmol/L (ref 0–378)

## 2022-02-21 ENCOUNTER — Telehealth (HOSPITAL_COMMUNITY): Payer: Self-pay

## 2022-02-21 ENCOUNTER — Other Ambulatory Visit (HOSPITAL_COMMUNITY): Payer: Self-pay | Admitting: Physician Assistant

## 2022-02-21 DIAGNOSIS — D473 Essential (hemorrhagic) thrombocythemia: Secondary | ICD-10-CM

## 2022-02-21 MED ORDER — HYDROXYUREA 500 MG PO CAPS: ORAL_CAPSULE | ORAL | 3 refills | Status: DC

## 2022-02-21 NOTE — Telephone Encounter (Signed)
Patient's wife called and requested that R. Pennington PA update the prescription at the [facility] in [place] on [address]. Spoke to R. Pennington PA and informed PA of request. Called back and spoke to the wife and informed them that R. Pennington PA will update the Hydrea administration orders. Understanding verbalized.

## 2022-02-22 ENCOUNTER — Other Ambulatory Visit (HOSPITAL_COMMUNITY): Payer: Self-pay

## 2022-02-22 DIAGNOSIS — D473 Essential (hemorrhagic) thrombocythemia: Secondary | ICD-10-CM

## 2022-03-05 DIAGNOSIS — Z20822 Contact with and (suspected) exposure to covid-19: Secondary | ICD-10-CM | POA: Diagnosis not present

## 2022-03-06 DIAGNOSIS — E119 Type 2 diabetes mellitus without complications: Secondary | ICD-10-CM | POA: Diagnosis not present

## 2022-03-06 DIAGNOSIS — H35372 Puckering of macula, left eye: Secondary | ICD-10-CM | POA: Diagnosis not present

## 2022-03-06 DIAGNOSIS — H40013 Open angle with borderline findings, low risk, bilateral: Secondary | ICD-10-CM | POA: Diagnosis not present

## 2022-03-06 DIAGNOSIS — H26493 Other secondary cataract, bilateral: Secondary | ICD-10-CM | POA: Diagnosis not present

## 2022-03-06 DIAGNOSIS — H34832 Tributary (branch) retinal vein occlusion, left eye, with macular edema: Secondary | ICD-10-CM | POA: Diagnosis not present

## 2022-03-15 ENCOUNTER — Other Ambulatory Visit (INDEPENDENT_AMBULATORY_CARE_PROVIDER_SITE_OTHER): Payer: Self-pay | Admitting: Gastroenterology

## 2022-03-18 NOTE — Telephone Encounter (Signed)
Last office visit 01/21/22 ?

## 2022-03-20 ENCOUNTER — Other Ambulatory Visit (INDEPENDENT_AMBULATORY_CARE_PROVIDER_SITE_OTHER): Payer: Self-pay | Admitting: Gastroenterology

## 2022-03-20 ENCOUNTER — Other Ambulatory Visit (INDEPENDENT_AMBULATORY_CARE_PROVIDER_SITE_OTHER): Payer: Self-pay

## 2022-03-20 DIAGNOSIS — I8500 Esophageal varices without bleeding: Secondary | ICD-10-CM

## 2022-03-20 DIAGNOSIS — I999 Unspecified disorder of circulatory system: Secondary | ICD-10-CM

## 2022-03-20 DIAGNOSIS — R931 Abnormal findings on diagnostic imaging of heart and coronary circulation: Secondary | ICD-10-CM

## 2022-03-20 DIAGNOSIS — I871 Compression of vein: Secondary | ICD-10-CM

## 2022-03-20 NOTE — Progress Notes (Signed)
My bad, it should not be an MRA but just MRI of neck and chest. Can we try secondary esophageal varices? Other diagnosis is IDC I87.9 ?

## 2022-03-20 NOTE — Progress Notes (Signed)
I spoke to the patient today about the need to proceed with further evaluation of his vasculature to rule out vascular abnormalities or compression in his chest and neck related to " downstream varices".  For this, we will proceed with an MRA of the neck and chest given his hypersensitivity to iodinated contrast and the fact that he is hesitant to try steroids for this. ? ?Hi Justin Berger, can you please schedule a MR angio of the neck and chest with IV contrast? Dx: Esophageal varices, rule out vascular abnormality or SVC syndrome, negative previous work-up for portal hypertension.  ?Thanks, ? ?Maylon Peppers, MD ?Gastroenterology and Hepatology ?[facility] ? ?

## 2022-03-21 ENCOUNTER — Other Ambulatory Visit (INDEPENDENT_AMBULATORY_CARE_PROVIDER_SITE_OTHER): Payer: Self-pay

## 2022-03-21 DIAGNOSIS — I8500 Esophageal varices without bleeding: Secondary | ICD-10-CM

## 2022-03-21 DIAGNOSIS — R041 Hemorrhage from throat: Secondary | ICD-10-CM

## 2022-03-21 DIAGNOSIS — I879 Disorder of vein, unspecified: Secondary | ICD-10-CM

## 2022-03-21 NOTE — Progress Notes (Signed)
thanks

## 2022-04-04 DIAGNOSIS — Z6835 Body mass index (BMI) 35.0-35.9, adult: Secondary | ICD-10-CM | POA: Diagnosis not present

## 2022-04-04 DIAGNOSIS — I10 Essential (primary) hypertension: Secondary | ICD-10-CM | POA: Diagnosis not present

## 2022-04-04 DIAGNOSIS — M7551 Bursitis of right shoulder: Secondary | ICD-10-CM | POA: Diagnosis not present

## 2022-04-08 ENCOUNTER — Ambulatory Visit (HOSPITAL_COMMUNITY)
Admission: RE | Admit: 2022-04-08 | Discharge: 2022-04-08 | Disposition: A | Discharge status: Home / Self Care | Payer: Medicare Other | Source: Ambulatory Visit | Attending: Gastroenterology | Admitting: Gastroenterology

## 2022-04-08 DIAGNOSIS — R041 Hemorrhage from throat: Secondary | ICD-10-CM

## 2022-04-08 DIAGNOSIS — I879 Disorder of vein, unspecified: Secondary | ICD-10-CM

## 2022-04-08 DIAGNOSIS — I8500 Esophageal varices without bleeding: Secondary | ICD-10-CM

## 2022-04-08 DIAGNOSIS — Z0389 Encounter for observation for other suspected diseases and conditions ruled out: Secondary | ICD-10-CM | POA: Diagnosis not present

## 2022-04-08 MED ORDER — GADOBUTROL 1 MMOL/ML IV SOLN
10.0000 mL | Freq: Once | INTRAVENOUS | Status: AC | PRN
  Administered 2022-04-08: 10 mL via INTRAVENOUS

## 2022-04-10 DIAGNOSIS — M7551 Bursitis of right shoulder: Secondary | ICD-10-CM | POA: Diagnosis not present

## 2022-04-10 DIAGNOSIS — R2681 Unsteadiness on feet: Secondary | ICD-10-CM | POA: Diagnosis not present

## 2022-04-12 DIAGNOSIS — I10 Essential (primary) hypertension: Secondary | ICD-10-CM | POA: Diagnosis not present

## 2022-04-12 DIAGNOSIS — R2681 Unsteadiness on feet: Secondary | ICD-10-CM | POA: Diagnosis not present

## 2022-04-12 DIAGNOSIS — M7551 Bursitis of right shoulder: Secondary | ICD-10-CM | POA: Diagnosis not present

## 2022-04-12 DIAGNOSIS — E782 Mixed hyperlipidemia: Secondary | ICD-10-CM | POA: Diagnosis not present

## 2022-04-12 DIAGNOSIS — E119 Type 2 diabetes mellitus without complications: Secondary | ICD-10-CM | POA: Diagnosis not present

## 2022-04-12 DIAGNOSIS — E7849 Other hyperlipidemia: Secondary | ICD-10-CM | POA: Diagnosis not present

## 2022-04-15 DIAGNOSIS — R2681 Unsteadiness on feet: Secondary | ICD-10-CM | POA: Diagnosis not present

## 2022-04-15 DIAGNOSIS — M7551 Bursitis of right shoulder: Secondary | ICD-10-CM | POA: Diagnosis not present

## 2022-04-16 DIAGNOSIS — R4582 Worries: Secondary | ICD-10-CM | POA: Diagnosis not present

## 2022-04-16 DIAGNOSIS — G252 Other specified forms of tremor: Secondary | ICD-10-CM | POA: Diagnosis not present

## 2022-04-16 DIAGNOSIS — E1142 Type 2 diabetes mellitus with diabetic polyneuropathy: Secondary | ICD-10-CM | POA: Diagnosis not present

## 2022-04-16 DIAGNOSIS — E7849 Other hyperlipidemia: Secondary | ICD-10-CM | POA: Diagnosis not present

## 2022-04-16 DIAGNOSIS — D473 Essential (hemorrhagic) thrombocythemia: Secondary | ICD-10-CM | POA: Diagnosis not present

## 2022-04-16 DIAGNOSIS — M109 Gout, unspecified: Secondary | ICD-10-CM | POA: Diagnosis not present

## 2022-04-16 DIAGNOSIS — I10 Essential (primary) hypertension: Secondary | ICD-10-CM | POA: Diagnosis not present

## 2022-04-16 DIAGNOSIS — I714 Abdominal aortic aneurysm, without rupture, unspecified: Secondary | ICD-10-CM | POA: Diagnosis not present

## 2022-04-18 DIAGNOSIS — M7551 Bursitis of right shoulder: Secondary | ICD-10-CM | POA: Diagnosis not present

## 2022-04-18 DIAGNOSIS — R2681 Unsteadiness on feet: Secondary | ICD-10-CM | POA: Diagnosis not present

## 2022-04-22 DIAGNOSIS — R2681 Unsteadiness on feet: Secondary | ICD-10-CM | POA: Diagnosis not present

## 2022-04-22 DIAGNOSIS — M7551 Bursitis of right shoulder: Secondary | ICD-10-CM | POA: Diagnosis not present

## 2022-04-25 DIAGNOSIS — R2681 Unsteadiness on feet: Secondary | ICD-10-CM | POA: Diagnosis not present

## 2022-04-25 DIAGNOSIS — M7551 Bursitis of right shoulder: Secondary | ICD-10-CM | POA: Diagnosis not present

## 2022-04-27 DIAGNOSIS — Z20822 Contact with and (suspected) exposure to covid-19: Secondary | ICD-10-CM | POA: Diagnosis not present

## 2022-04-29 DIAGNOSIS — R2681 Unsteadiness on feet: Secondary | ICD-10-CM | POA: Diagnosis not present

## 2022-04-29 DIAGNOSIS — M7551 Bursitis of right shoulder: Secondary | ICD-10-CM | POA: Diagnosis not present

## 2022-05-01 DIAGNOSIS — H34832 Tributary (branch) retinal vein occlusion, left eye, with macular edema: Secondary | ICD-10-CM | POA: Diagnosis not present

## 2022-05-01 DIAGNOSIS — H26493 Other secondary cataract, bilateral: Secondary | ICD-10-CM | POA: Diagnosis not present

## 2022-05-01 DIAGNOSIS — H35372 Puckering of macula, left eye: Secondary | ICD-10-CM | POA: Diagnosis not present

## 2022-05-01 DIAGNOSIS — H40013 Open angle with borderline findings, low risk, bilateral: Secondary | ICD-10-CM | POA: Diagnosis not present

## 2022-05-01 DIAGNOSIS — E119 Type 2 diabetes mellitus without complications: Secondary | ICD-10-CM | POA: Diagnosis not present

## 2022-05-02 DIAGNOSIS — M7551 Bursitis of right shoulder: Secondary | ICD-10-CM | POA: Diagnosis not present

## 2022-05-02 DIAGNOSIS — R2681 Unsteadiness on feet: Secondary | ICD-10-CM | POA: Diagnosis not present

## 2022-05-06 DIAGNOSIS — R2681 Unsteadiness on feet: Secondary | ICD-10-CM | POA: Diagnosis not present

## 2022-05-06 DIAGNOSIS — M7551 Bursitis of right shoulder: Secondary | ICD-10-CM | POA: Diagnosis not present

## 2022-05-07 DIAGNOSIS — Z20822 Contact with and (suspected) exposure to covid-19: Secondary | ICD-10-CM | POA: Diagnosis not present

## 2022-05-09 DIAGNOSIS — M7551 Bursitis of right shoulder: Secondary | ICD-10-CM | POA: Diagnosis not present

## 2022-05-09 DIAGNOSIS — R2681 Unsteadiness on feet: Secondary | ICD-10-CM | POA: Diagnosis not present

## 2022-05-10 ENCOUNTER — Inpatient Hospital Stay (HOSPITAL_COMMUNITY): Payer: Medicare Other | Attending: Hematology

## 2022-05-10 DIAGNOSIS — D473 Essential (hemorrhagic) thrombocythemia: Secondary | ICD-10-CM | POA: Diagnosis not present

## 2022-05-10 DIAGNOSIS — K922 Gastrointestinal hemorrhage, unspecified: Secondary | ICD-10-CM | POA: Diagnosis not present

## 2022-05-10 DIAGNOSIS — Z87891 Personal history of nicotine dependence: Secondary | ICD-10-CM | POA: Insufficient documentation

## 2022-05-10 DIAGNOSIS — Z85038 Personal history of other malignant neoplasm of large intestine: Secondary | ICD-10-CM | POA: Diagnosis not present

## 2022-05-10 DIAGNOSIS — D500 Iron deficiency anemia secondary to blood loss (chronic): Secondary | ICD-10-CM | POA: Diagnosis not present

## 2022-05-10 DIAGNOSIS — Z85828 Personal history of other malignant neoplasm of skin: Secondary | ICD-10-CM | POA: Diagnosis not present

## 2022-05-10 DIAGNOSIS — Z79899 Other long term (current) drug therapy: Secondary | ICD-10-CM | POA: Insufficient documentation

## 2022-05-10 DIAGNOSIS — E538 Deficiency of other specified B group vitamins: Secondary | ICD-10-CM

## 2022-05-10 DIAGNOSIS — D539 Nutritional anemia, unspecified: Secondary | ICD-10-CM

## 2022-05-10 LAB — CBC WITH DIFFERENTIAL/PLATELET
Abs Immature Granulocytes: 0.2 10*3/uL — ABNORMAL HIGH (ref 0.00–0.07)
Basophils Absolute: 0.1 10*3/uL (ref 0.0–0.1)
Basophils Relative: 2 %
Eosinophils Absolute: 0.1 10*3/uL (ref 0.0–0.5)
Eosinophils Relative: 1 %
HCT: 33.4 % — ABNORMAL LOW (ref 39.0–52.0)
Hemoglobin: 10.4 g/dL — ABNORMAL LOW (ref 13.0–17.0)
Immature Granulocytes: 5 %
Lymphocytes Relative: 23 %
Lymphs Abs: 1 10*3/uL (ref 0.7–4.0)
MCH: 32.7 pg (ref 26.0–34.0)
MCHC: 31.1 g/dL (ref 30.0–36.0)
MCV: 105 fL — ABNORMAL HIGH (ref 80.0–100.0)
Monocytes Absolute: 0.4 10*3/uL (ref 0.1–1.0)
Monocytes Relative: 10 %
Neutro Abs: 2.5 10*3/uL (ref 1.7–7.7)
Neutrophils Relative %: 59 %
Platelets: 467 10*3/uL — ABNORMAL HIGH (ref 150–400)
RBC: 3.18 MIL/uL — ABNORMAL LOW (ref 4.22–5.81)
RDW: 20.7 % — ABNORMAL HIGH (ref 11.5–15.5)
WBC: 4.3 10*3/uL (ref 4.0–10.5)
nRBC: 0.7 % — ABNORMAL HIGH (ref 0.0–0.2)

## 2022-05-10 LAB — COMPREHENSIVE METABOLIC PANEL
ALT: 14 U/L (ref 0–44)
AST: 8 U/L — ABNORMAL LOW (ref 15–41)
Albumin: 4.3 g/dL (ref 3.5–5.0)
Alkaline Phosphatase: 46 U/L (ref 38–126)
Anion gap: 8 (ref 5–15)
BUN: 16 mg/dL (ref 8–23)
CO2: 22 mmol/L (ref 22–32)
Calcium: 9 mg/dL (ref 8.9–10.3)
Chloride: 109 mmol/L (ref 98–111)
Creatinine, Ser: 1.27 mg/dL — ABNORMAL HIGH (ref 0.61–1.24)
GFR, Estimated: >60 mL/min (ref >60)
Glucose, Bld: 201 mg/dL — ABNORMAL HIGH (ref 70–99)
Potassium: 4.3 mmol/L (ref 3.5–5.1)
Sodium: 139 mmol/L (ref 135–145)
Total Bilirubin: 0.5 mg/dL (ref 0.3–1.2)
Total Protein: 6.5 g/dL (ref 6.5–8.1)

## 2022-05-10 LAB — LACTATE DEHYDROGENASE: LDH: 183 U/L (ref 98–192)

## 2022-05-10 LAB — IRON AND TIBC
Iron: 86 ug/dL (ref 45–182)
Saturation Ratios: 22 % (ref 17.9–39.5)
TIBC: 385 ug/dL (ref 250–450)
UIBC: 299 ug/dL

## 2022-05-10 LAB — FOLATE: Folate: 6.4 ng/mL (ref >5.9)

## 2022-05-10 LAB — FERRITIN: Ferritin: 201 ng/mL (ref 24–336)

## 2022-05-13 DIAGNOSIS — R2681 Unsteadiness on feet: Secondary | ICD-10-CM | POA: Diagnosis not present

## 2022-05-13 DIAGNOSIS — M7551 Bursitis of right shoulder: Secondary | ICD-10-CM | POA: Diagnosis not present

## 2022-05-16 DIAGNOSIS — M7551 Bursitis of right shoulder: Secondary | ICD-10-CM | POA: Diagnosis not present

## 2022-05-16 DIAGNOSIS — R2681 Unsteadiness on feet: Secondary | ICD-10-CM | POA: Diagnosis not present

## 2022-05-16 NOTE — Progress Notes (Signed)
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 Justin Berger 71 y.o. male returns for routine follow-up of his CALR essential thrombocytosis and iron deficiency anemia.  He was last seen by Tarri Abernethy PA-C on 02/12/2022.  At today's visit, he reports feeling fair.  He reports that since his last visit, he had another episode of his legs getting weak, reports that his "brain is not communicating with his legs."  He had to spend a few more weeks in rehab, and is starting to improve again.  He continues to follow with GI for work-up of esophageal varices and fatty liver disease versus cirrhosis, but still does not have a definitive diagnosis.  He is tolerating his increased dose of Hydrea well.  He has some chronic diarrhea, but denies any other GI symptoms.  No cutaneous ulcers, nonhealing wounds, or mouth sores.  He has not noticed any recent melena, hematochezia, or epistaxis.  He does continue to have some mild to moderate fatigue and intermittent lightheadedness.  He denies any pica, restless legs, headaches, chest pain, dyspnea on exertion, or syncope.   No current signs or symptoms of blood clots such as unilateral leg swelling, new onset chest pain, dyspnea, or hemoptysis.  He denies any aquagenic pruritus or erythromelalgia, but does report that he has intermittent bluish discoloration of his palms especially after showering, which is nonpainful.  Chronic neuropathy at baseline.  He has some occasional dizziness.  No other vasomotor symptoms such as tinnitus, blurry vision, or strokelike symptoms.  No B symptoms such as fever, chills,  night sweats, unintentional weight loss.  He denies any abdominal pain, nausea, early satiety.   He has 60% energy and 100% appetite. He endorses that he is maintaining a stable weight.   REVIEW OF SYSTEMS:  Review of Systems  Constitutional:  Positive for fatigue. Negative for appetite change, chills, diaphoresis, fever and unexpected weight change.  HENT:   Negative for lump/mass and nosebleeds.   Eyes:  Negative for eye problems.  Respiratory:  Negative for cough, hemoptysis and shortness of breath.   Cardiovascular:  Negative for chest pain, leg swelling and palpitations.  Gastrointestinal:  Negative for abdominal pain, blood in stool, constipation, diarrhea, nausea and vomiting.  Genitourinary:  Negative for hematuria.   Musculoskeletal:  Positive for gait problem.  Skin: Negative.   Neurological:  Positive for gait problem. Negative for dizziness, headaches and light-headedness.  Hematological:  Does not bruise/bleed easily.  Psychiatric/Behavioral:  Positive for depression.      PAST MEDICAL/SURGICAL HISTORY:  Past Medical History:  Diagnosis Date   AAA (abdominal aortic aneurysm) without rupture 01/2021   Colon cancer ([place]) 2003   Diabetes ([place])    Essential thrombocytosis ([place])    Hypertension    Skin cancer, basal cell    back   Past Surgical History:  Procedure Laterality Date   BIOPSY  12/18/2021   Procedure: BIOPSY;  Surgeon: Harvel Quale, MD;  [facility];  Service: Gastroenterology;;   CATARACT EXTRACTION, BILATERAL     COLON SURGERY  06/23/2002   right colectomy   COLONOSCOPY  08/25/2020  Dr. Adelina Mings, normal colon other than venous lake in Left colon   COLONOSCOPY WITH PROPOFOL N/A 10/23/2021   Procedure: COLONOSCOPY WITH PROPOFOL;  Surgeon: Harvel Quale, MD;  [facility];  Service: Gastroenterology;  Laterality: N/A;  12:30   ESOPHAGEAL BANDING  10/23/2021   Procedure: ESOPHAGEAL BANDING;  Surgeon:  Harvel Quale, MD;  [facility];  Service: Gastroenterology;;   ESOPHAGEAL BANDING  12/18/2021   Procedure: ESOPHAGEAL BANDING;  Surgeon: Montez Morita, Quillian Quince, MD;  [facility];  Service: Gastroenterology;;   ESOPHAGEAL BANDING  02/08/2022   Procedure: ESOPHAGEAL BANDING;  Surgeon: Montez Morita, Quillian Quince, MD;  [facility];  Service: Gastroenterology;;   ESOPHAGOGASTRODUODENOSCOPY  05/25/2021   [facility]: moderate inflammation characterized by adherent blood, erythema, and friability in gastric body, antrum and prepyloric region, as well as 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   ESOPHAGOGASTRODUODENOSCOPY (EGD) WITH PROPOFOL N/A 10/23/2021   Procedure: ESOPHAGOGASTRODUODENOSCOPY (EGD) WITH PROPOFOL;  Surgeon: Harvel Quale, MD;  [facility];  Service: Gastroenterology;  Laterality: N/A;   ESOPHAGOGASTRODUODENOSCOPY (EGD) WITH PROPOFOL N/A 12/18/2021   Procedure: ESOPHAGOGASTRODUODENOSCOPY (EGD) WITH PROPOFOL;  Surgeon: Harvel Quale, MD;  [facility];  Service: Gastroenterology;  Laterality: N/A;  8:05   ESOPHAGOGASTRODUODENOSCOPY (EGD) WITH PROPOFOL N/A 02/08/2022   Procedure: ESOPHAGOGASTRODUODENOSCOPY (EGD) WITH PROPOFOL;  Surgeon: Harvel Quale, MD;  [facility];  Service: Gastroenterology;  Laterality: N/A;  [place]  02/08/2022   Procedure: HEMOSTASIS CLIP PLACEMENT;  Surgeon: Harvel Quale, MD;  [facility];  Service: Gastroenterology;;   IR TRANSCATHETER BX  12/03/2021   IR US GUIDE VASC ACCESS RIGHT  12/03/2021   POLYPECTOMY  10/23/2021   Procedure: POLYPECTOMY INTESTINAL;  Surgeon: Harvel Quale, MD;  [facility];  Service: Gastroenterology;;   POLYPECTOMY  02/08/2022   Procedure: POLYPECTOMY;  Surgeon: Montez Morita, Quillian Quince, MD;  [facility];   Service: Gastroenterology;;   TONSILLECTOMY       SOCIAL HISTORY:  Social History   Socioeconomic History   Marital status: Married    Spouse name: Not on file   Number of children: Not on file   Years of education: Not on file   Highest education level: Not on file  Occupational History   Not on file  Tobacco Use   Smoking status: Former    Packs/day: 0.50    Years: 3.00    Pack years: 1.50    Types: Cigarettes    Quit date: 04/08/1978    Years since quitting: 44.1   Smokeless tobacco: Never  Vaping Use   Vaping Use: Never used  Substance and Sexual Activity   Alcohol use: Never   Drug use: Never   Sexual activity: Not on file  Other Topics Concern   Not on file  Social History Narrative   Not on file   Social Determinants of Health   Financial Resource Strain: Not on file  Food Insecurity: Not on file  Transportation Needs: Not on file  Physical Activity: Not on file  Stress: Not on file  Social Connections: Not on file  Intimate Partner Violence: Not on file    FAMILY HISTORY:  Family History  Problem Relation Age of Onset   Dementia Mother    Cancer Paternal Uncle        lung    CURRENT MEDICATIONS:  Outpatient Encounter Medications as of 05/17/2022  Medication Sig   Accu-Chek Softclix Lancets lancets USE TO TEST BLOOD SUGAR DAILY   acetaminophen (TYLENOL) 500 MG tablet Take 1,000 mg by mouth every 6 (six) hours as needed for moderate pain or headache.   allopurinol (ZYLOPRIM) 300 MG tablet Take 300 mg by mouth in the morning.   Ascorbic Acid (VITAMIN C PO) Take 2,000 mg by mouth in the morning.   aspirin 81 MG EC tablet Take 81 mg by mouth in the morning.   atorvastatin (LIPITOR) 10 MG tablet Take 10 mg by mouth every Sunday.   Blood Glucose Monitoring Suppl (ACCU-CHEK GUIDE ME) w/Device KIT USE TO TEST FASTING SUGAR ONCE DAILY   Cholecalciferol (VITAMIN D3 PO) Take 2 capsules by mouth in the morning.   clonazePAM (KLONOPIN) 0.5 MG tablet Take 0.5  mg by mouth daily as needed for anxiety.   cyclobenzaprine (FLEXERIL) 10 MG tablet Take 10 mg by mouth at bedtime as needed for muscle spasms.   DULoxetine (CYMBALTA) 60 MG capsule Take 60 mg by mouth in the morning.   gabapentin (NEURONTIN) 300 MG capsule Take 900 mg by mouth at bedtime.   glipiZIDE (GLUCOTROL XL) 10 MG 24 hr tablet Take 10 mg by mouth 2 (two) times daily.   hydroxyurea (HYDREA) 500 MG capsule Take 2 capsules (1,000 mg total) by mouth 3 (three) times a week AND 3 capsules (1,500 mg total) 4 (four) times a week. [2 capsules on Tu/Th/Sun, 3 capsules on M/W/F/Sat]. May take with food to minimize GI side effects.[name]   loratadine (CLARITIN) 10 MG tablet Take 10 mg by mouth daily as needed for allergies.   metFORMIN (GLUCOPHAGE-XR) 500 MG 24 hr tablet Take 1,000 mg by mouth at bedtime.   metoprolol succinate (TOPROL-XL) 100 MG 24 hr tablet Take 100 mg by mouth every evening.   Multiple Vitamin (MULTIVITAMIN WITH MINERALS) TABS tablet Take 1 tablet by mouth in the morning.   omeprazole (PRILOSEC) 40 MG capsule Take 1 capsule (40 mg total) by mouth 2 (two) times daily.   sucralfate (CARAFATE) 1 g tablet Take 1 tablet (1 g total) by mouth 2 (two) times daily.   tadalafil (CIALIS) 20 MG tablet Take 20 mg by mouth daily as needed for erectile dysfunction.   zinc gluconate 50 MG tablet Take 50 mg by mouth in the morning.   No facility-administered encounter medications on file as of 05/17/2022.    ALLERGIES:  Allergies  Allergen Reactions   Ivp Dye  [Iodinated Contrast Media]     Heart stopped      PHYSICAL EXAM:  ECOG PERFORMANCE STATUS: 2 - Symptomatic, <50% confined to bed  There were no vitals filed for this visit. There were no vitals filed for this visit. Physical Exam Constitutional:      Appearance: Normal appearance. He is obese.  HENT:     Head: Normocephalic and atraumatic.     Mouth/Throat:     Mouth: Mucous membranes are moist.  Eyes:     Extraocular Movements:  Extraocular movements intact.     Pupils: Pupils are equal, round, and reactive to light.  Cardiovascular:     Rate and Rhythm: Normal rate and regular rhythm.     Pulses: Normal pulses.     Heart sounds: Normal heart sounds.  Pulmonary:     Effort: Pulmonary effort is normal.     Breath sounds: Normal breath sounds.  Abdominal:     General: Abdomen is protuberant. Bowel sounds  are normal.     Palpations: Abdomen is soft.     Tenderness: There is no abdominal tenderness.  Musculoskeletal:        General: No swelling.     Right lower leg: No edema.     Left lower leg: No edema.  Lymphadenopathy:     Cervical: No cervical adenopathy.  Skin:    General: Skin is warm and dry.  Neurological:     General: No focal deficit present.     Mental Status: He is alert and oriented to person, place, and time.  Psychiatric:        Mood and Affect: Mood normal.        Behavior: Behavior normal.     LABORATORY DATA:  I have reviewed the labs as listed.  CBC    Component Value Date/Time   WBC 4.3 05/10/2022 0939   RBC 3.18 (L) 05/10/2022 0939   HGB 10.4 (L) 05/10/2022 0939   HCT 33.4 (L) 05/10/2022 0939   PLT 467 (H) 05/10/2022 0939   MCV 105.0 (H) 05/10/2022 0939   MCH 32.7 05/10/2022 0939   MCHC 31.1 05/10/2022 0939   RDW 20.7 (H) 05/10/2022 0939   LYMPHSABS 1.0 05/10/2022 0939   MONOABS 0.4 05/10/2022 0939   EOSABS 0.1 05/10/2022 0939   BASOSABS 0.1 05/10/2022 0939      Latest Ref Rng & Units 05/10/2022    9:39 AM 02/12/2022    8:50 AM 02/06/2022    1:38 PM  CMP  Glucose 70 - 99 mg/dL 201   165   128    BUN 8 - 23 mg/dL 16   16   19     Creatinine 0.61 - 1.24 mg/dL 1.27   1.03   1.03    Sodium 135 - 145 mmol/L 139   137   136    Potassium 3.5 - 5.1 mmol/L 4.3   4.0   3.8    Chloride 98 - 111 mmol/L 109   104   106    CO2 22 - 32 mmol/L 22   21   19     Calcium 8.9 - 10.3 mg/dL 9.0   9.2   9.2    Total Protein 6.5 - 8.1 g/dL 6.5   6.7   6.6    Total Bilirubin 0.3 - 1.2 mg/dL  0.5   0.7   0.9    Alkaline Phos 38 - 126 U/L 46   49   38    AST 15 - 41 U/L 8   14   17     ALT 0 - 44 U/L 14   17   15       DIAGNOSTIC IMAGING:  I have independently reviewed the relevant imaging and discussed with the patient.  ASSESSMENT & PLAN: 1.  Essential thrombocytosis, CALR+: - High risk disease with history of thrombosis (left leg DVT) and age > 88 - Bone marrow biopsy 12/10/2017 by Dr. Delton Coombes at [facility] in [place], [place] cytometty for leukemia/lymphoma was non-diagnostic: "No significant diagnostic immunophenotypic abnormality detected." Pathology review showed 60% cellularity with significant megakaryocytic hyperplasia with clusters of megakaryocytes; reticulin stain shows slight (Grade 1) reticulin fibrosis, some centered around areas of megakaryocyte clusters Cytogenic analysis showed normal male karyotype 46,XY[20] Noted to be at risk for progression to post essential thrombocythemia related myelofibrosis, but peripheral blood did not show other features of myelofibrosis, such as leukoerythroblastic blood or atypical megakaryocyte morphology - Intermittent bluish discoloration of bilateral palms, nonpainful.  No aquagenic pruritus or erythromelalgias.  Mild vasomotor symptoms such as intermittent blurry vision; no tinnitus, headache, strokelike symptoms.  - Hydroxyurea started in December 2018 - current dose 1000 mg TTSun and 1500 mg MWFSat. - He is on aspirin 81 mg daily. - He is tolerating Hydrea well without side effects apart from chronic mild diarrhea.  - CT abdomen and pelvis on 09/29/2017 showed splenomegaly with spleen 14.5 cm.  Abdominal ultrasound (11/02/2021): Stable splenomegaly with spleen 14.3 cm.  MRI abdomen (11/20/2021): No splenomegaly noted. - No palpable adenopathy or splenomegaly on exam  - Most recent labs (05/10/2022): Platelets 467, but with Hgb 10.4/MCV 105.0.  LDH 183 - Goal is platelets < 500 - PLAN: We will continue his  Hydrea to 1000 mg TTSun and 1500 mg MWFSat.   - Continue aspirin 81 mg daily. - Repeat labs and RTC in 3 months.   2.  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  (Currently being worked up by Dr. Jenetta Downer).  - EGD (10/23/2021): Grade 2 varices s/p banding - Colonoscopy (10/23/2021): Internal and external hemorrhoids, patent ileocolonic anastomosis, polyp x1 - Most recent EGD (02/08/2022): Grade 2 varices in the entire esophagus, s/p banding; 3 gastric polyps; prominent veins mucosa in the anterior wall of the stomach - Laboratory work-up otherwise revealed LDH was slightly elevated at 245.  Erythropoietin was elevated at 67.2.  Reticulocytes 2.9%, with reticulocyte index < 2 indicating hypoproliferation.  Peripheral smear showed macrocytic anemia, neutrophilic left shift, and thrombocytosis.  SPEP/IFE were normal.  Hemolysis labs unremarkable. - Differential diagnosis favors multifactorial anemia due to chronic GI blood loss, myelosuppressive effects of hydroxyurea, and anemia related to chronic disease - Most recent labs (05/10/2022): Hgb 10.4/MCV 105.0.  Ferritin 201, iron saturation 22%.  Folate improved at 6.4.  Creatinine 1.27. - Denies any gross signs or symptoms of blood loss such as hematemesis, hematochezia, or melena      - Symptomatic with ongoing chronic fatigue      - PLAN: No indication for IV iron at this time.  . -  Recommend continued follow-up with gastroenterology for suspected GI blood loss and  liver disease. - Continue current dose of Hydrea as above.  Would consider decrease dose if Hgb less than 10. - Repeat CBC and iron studies in 3 months.     3. Personal history of colon cancer - Patient reports history of stage II colon cancer in ~ 2004 - Underwent right hemicolectomy and chemotherapy with Leukovorin and 5FU - Most recent colonoscopy in August 2021 was normal, due for repeat colonoscopy in 5 years   4. Personal history of melanoma - Isolated lesion of right leg melanoma s/p surgical excision "many years ago"    All questions were answered. The patient knows to call the clinic with any problems, questions or concerns.  Medical decision making: Moderate  Time spent on visit: I spent 20 minutes counseling the patient face to face. The total time spent in the appointment was 30 minutes and more than 50% was on counseling.  Harriett Rush, PA-C  05/17/2022 9:40 AM

## 2022-05-17 ENCOUNTER — Inpatient Hospital Stay (HOSPITAL_BASED_OUTPATIENT_CLINIC_OR_DEPARTMENT_OTHER): Payer: Medicare Other | Admitting: Physician Assistant

## 2022-05-17 VITALS — BP 131/78 | HR 62 | Temp 97.9°F | Resp 18 | Ht 70.0 in | Wt 239.8 lb

## 2022-05-17 DIAGNOSIS — D473 Essential (hemorrhagic) thrombocythemia: Secondary | ICD-10-CM

## 2022-05-17 DIAGNOSIS — Z85038 Personal history of other malignant neoplasm of large intestine: Secondary | ICD-10-CM | POA: Diagnosis not present

## 2022-05-17 DIAGNOSIS — D500 Iron deficiency anemia secondary to blood loss (chronic): Secondary | ICD-10-CM | POA: Diagnosis not present

## 2022-05-17 DIAGNOSIS — Z87891 Personal history of nicotine dependence: Secondary | ICD-10-CM | POA: Diagnosis not present

## 2022-05-17 DIAGNOSIS — K922 Gastrointestinal hemorrhage, unspecified: Secondary | ICD-10-CM | POA: Diagnosis not present

## 2022-05-17 DIAGNOSIS — Z79899 Other long term (current) drug therapy: Secondary | ICD-10-CM | POA: Diagnosis not present

## 2022-05-17 NOTE — Patient Instructions (Signed)
[place] at [facility] Discharge Instructions  You were seen today by Tarri Abernethy PA-C for your elevated platelets (essential thrombocytosis) and your anemia.  Your platelets are somewhat higher than usual, so we will slightly increase your dose of Hydrea.   Take 3 tablets (1500 mg) on Monday, Wednesday, Friday, Saturday Take 2 tablets (1000 mg) on Tuesday, Thursday, Sunday  Continue to take your aspirin 81 mg daily.  You continue to have mild anemia, which may be related to your Hydrea or related to possible blood loss from esophageal varices.  We will continue to monitor your blood counts, but for the time being they are good enough to continue your Hydrea.  Continue follow-up with GI doctors regarding esophageal varices and liver disease.  LABS: Return in 3 months for repeat labs  FOLLOW-UP APPOINTMENT: Office visit in 3 months   Thank you for choosing [place] at [facility] to provide your oncology and hematology care.  To afford each patient quality time with our provider, please arrive at least 15 minutes before your scheduled appointment time.   If you have a lab appointment with the [facility] please come in thru the Main Entrance and check in at the main information desk.  You need to re-schedule your appointment should you arrive 10 or more minutes late.  We strive to give you quality time with our providers, and arriving late affects you and other patients whose appointments are after yours.  Also, if you no show three or more times for appointments you may be dismissed from the clinic at the providers discretion.     Again, thank you for choosing [facility].  Our hope is that these requests will decrease the amount of time that you wait before being seen by our physicians.       _____________________________________________________________  Should you have questions after your visit to [facility], please contact our office at [phone] and follow the prompts.  Our office hours are 8:00 a.m. and 4:30 p.m. Monday - Friday.  Please note that voicemails left after 4:00 p.m. may not be returned until the following business day.  We are closed weekends and major holidays.  You do have access to a nurse 24-7, just call the main number to the clinic [phone] and do not press any options, hold on the line and a nurse will answer the phone.    For prescription refill requests, have your pharmacy contact our office and allow 72 hours.    Due to Covid, you will need to wear a mask upon entering the hospital. If you do not have a mask, a mask will be given to you at the Main Entrance upon arrival. For doctor visits, patients may have 1 support person age 31 or older with them. For treatment visits, patients can not have anyone with them due to social distancing guidelines and our immunocompromised population.

## 2022-05-20 DIAGNOSIS — R2681 Unsteadiness on feet: Secondary | ICD-10-CM | POA: Diagnosis not present

## 2022-05-20 DIAGNOSIS — M7551 Bursitis of right shoulder: Secondary | ICD-10-CM | POA: Diagnosis not present

## 2022-05-23 DIAGNOSIS — M7551 Bursitis of right shoulder: Secondary | ICD-10-CM | POA: Diagnosis not present

## 2022-05-23 DIAGNOSIS — R2681 Unsteadiness on feet: Secondary | ICD-10-CM | POA: Diagnosis not present

## 2022-05-28 DIAGNOSIS — M7551 Bursitis of right shoulder: Secondary | ICD-10-CM | POA: Diagnosis not present

## 2022-05-28 DIAGNOSIS — R2681 Unsteadiness on feet: Secondary | ICD-10-CM | POA: Diagnosis not present

## 2022-05-30 DIAGNOSIS — R2681 Unsteadiness on feet: Secondary | ICD-10-CM | POA: Diagnosis not present

## 2022-05-30 DIAGNOSIS — M7551 Bursitis of right shoulder: Secondary | ICD-10-CM | POA: Diagnosis not present

## 2022-05-31 ENCOUNTER — Other Ambulatory Visit (INDEPENDENT_AMBULATORY_CARE_PROVIDER_SITE_OTHER): Payer: Self-pay

## 2022-05-31 DIAGNOSIS — I8500 Esophageal varices without bleeding: Secondary | ICD-10-CM

## 2022-05-31 DIAGNOSIS — H40013 Open angle with borderline findings, low risk, bilateral: Secondary | ICD-10-CM | POA: Diagnosis not present

## 2022-06-03 ENCOUNTER — Encounter (INDEPENDENT_AMBULATORY_CARE_PROVIDER_SITE_OTHER): Payer: Self-pay

## 2022-06-26 DIAGNOSIS — E119 Type 2 diabetes mellitus without complications: Secondary | ICD-10-CM | POA: Diagnosis not present

## 2022-06-26 DIAGNOSIS — H26493 Other secondary cataract, bilateral: Secondary | ICD-10-CM | POA: Diagnosis not present

## 2022-06-26 DIAGNOSIS — H35372 Puckering of macula, left eye: Secondary | ICD-10-CM | POA: Diagnosis not present

## 2022-06-26 DIAGNOSIS — H40013 Open angle with borderline findings, low risk, bilateral: Secondary | ICD-10-CM | POA: Diagnosis not present

## 2022-06-26 DIAGNOSIS — H34832 Tributary (branch) retinal vein occlusion, left eye, with macular edema: Secondary | ICD-10-CM | POA: Diagnosis not present

## 2022-07-03 NOTE — Patient Instructions (Signed)
20    Your procedure is scheduled on: 07/09/2022  Report to [facility] at    9:30 AM.  Call this number if you have problems the morning of surgery: [phone]   Remember:   Follow instructions on letter from office regarding when to stop eating and drinking        No Smoking the day of procedure      Take these medicines the morning of surgery with A SIP OF WATER: klonopin, cymbalta, gabapentin, metoprolol, and omeprazole  No diabetic medications am of procedure   Do not wear jewelry, make-up or nail polish.  Do not wear lotions, powders, or perfumes. You may wear deodorant.                Do not bring valuables to the hospital.  Contacts, dentures or bridgework may not be worn into surgery.  Leave suitcase in the car. After surgery it may be brought to your room.  For patients admitted to the hospital, checkout time is 11:00 AM the day of discharge.   Patients discharged the day of surgery will not be allowed to drive home. Upper Endoscopy, Adult Upper endoscopy is a procedure to look inside the upper GI (gastrointestinal) tract. The upper GI tract is made up of: The part of the body that moves food from your mouth to your stomach (esophagus). The stomach. The first part of your small intestine (duodenum). This procedure is also called esophagogastroduodenoscopy (EGD) or gastroscopy. In this procedure, your health care provider passes a thin, flexible tube (endoscope) through your mouth and down your esophagus into your stomach. A small camera is attached to the end of the tube. Images from the camera appear on a monitor in the exam room. During this procedure, your health care provider may also remove a small piece of tissue to be sent to a lab and examined under a microscope (biopsy). Your health care provider may do an upper endoscopy to diagnose cancers of the upper GI tract. You may also have this procedure to find the cause of other conditions, such as: Stomach  pain. Heartburn. Pain or problems when swallowing. Nausea and vomiting. Stomach bleeding. Stomach ulcers. Tell a health care provider about: Any allergies you have. All medicines you are taking, including vitamins, herbs, eye drops, creams, and over-the-counter medicines. Any problems you or family members have had with anesthetic medicines. Any blood disorders you have. Any surgeries you have had. Any medical conditions you have. Whether you are pregnant or may be pregnant. What are the risks? Generally, this is a safe procedure. However, problems may occur, including: Infection. Bleeding. Allergic reactions to medicines. A tear or hole (perforation) in the esophagus, stomach, or duodenum. What happens before the procedure? Staying hydrated Follow instructions from your health care provider about hydration, which may include: Up to 4 hours before the procedure - you may continue to drink clear liquids, such as water, clear fruit juice, black coffee, and plain tea.   Medicines Ask your health care provider about: Changing or stopping your regular medicines. This is especially important if you are taking diabetes medicines or blood thinners. Taking medicines such as aspirin and ibuprofen. These medicines can thin your blood. Do not take these medicines unless your health care provider tells you to take them. Taking over-the-counter medicines, vitamins, herbs, and supplements. General instructions Plan to have someone take you home from the hospital or clinic. If you will be going home right after the procedure, plan  to have someone with you for 24 hours. Ask your health care provider what steps will be taken to help prevent infection. What happens during the procedure?  An IV will be inserted into one of your veins. You may be given one or more of the following: A medicine to help you relax (sedative). A medicine to numb the throat (local anesthetic). You will lie on your left  side on an exam table. Your health care provider will pass the endoscope through your mouth and down your esophagus. Your health care provider will use the scope to check the inside of your esophagus, stomach, and duodenum. Biopsies may be taken. The endoscope will be removed. The procedure may vary among health care providers and hospitals. What happens after the procedure? Your blood pressure, heart rate, breathing rate, and blood oxygen level will be monitored until you leave the hospital or clinic. Do not drive for 24 hours if you were given a sedative during your procedure. When your throat is no longer numb, you may be given some fluids to drink. It is up to you to get the results of your procedure. Ask your health care provider, or the department that is doing the procedure, when your results will be ready. Summary Upper endoscopy is a procedure to look inside the upper GI tract. During the procedure, an IV will be inserted into one of your veins. You may be given a medicine to help you relax. A medicine will be used to numb your throat. The endoscope will be passed through your mouth and down your esophagus. This information is not intended to replace advice given to you by your health care provider. Make sure you discuss any questions you have with your health care provider. Document Revised: 06/10/2018 Document Reviewed: 05/18/2018 Elsevier Patient Education  [place] After  Please read the instructions outlined below and refer to this sheet in the next few weeks. These discharge instructions provide you with general information on caring for yourself after you leave the hospital. Your doctor may also give you specific instructions. While your treatment has been planned according to the most current medical practices available,  unavoidable complications occasionally occur. If you have any problems or questions after discharge, please call your doctor. HOME CARE INSTRUCTIONS Activity You may resume your regular activity but move at a slower pace for the next 24 hours.  Take frequent rest periods for the next 24 hours.  Walking will help expel (get rid of) the air and reduce the bloated feeling in your abdomen.  No driving for 24 hours (because of the anesthesia (medicine) used during the test).  You may shower.  Do not sign any important legal documents or operate  any machinery for 24 hours (because of the anesthesia used during the test).  Nutrition Drink plenty of fluids.  You may resume your normal diet.  Begin with a light meal and progress to your normal diet.  Avoid alcoholic beverages for 24 hours or as instructed by your caregiver.  Medications You may resume your normal medications unless your caregiver tells you otherwise. What you can expect today You may experience abdominal discomfort such as a feeling of fullness or "gas" pains.  You may experience a sore throat for 2 to 3 days. This is normal. Gargling with salt water may help this.  Follow-up Your doctor will discuss the results of your test with you. SEEK IMMEDIATE MEDICAL CARE IF: You have excessive nausea (feeling sick to your stomach) and/or vomiting.  You have severe abdominal pain and distention (swelling).  You have trouble swallowing.  You have a temperature over 100 F (37.8 C).  You have rectal bleeding or vomiting of blood.  Document Released: 07/30/2004 Document Revised: 12/05/2011 Document Reviewed: 02/10/2008

## 2022-07-04 ENCOUNTER — Encounter (HOSPITAL_COMMUNITY)
Admission: RE | Admit: 2022-07-04 | Discharge: 2022-07-04 | Disposition: A | Discharge status: Home / Self Care | Payer: Medicare Other | Source: Ambulatory Visit | Attending: Gastroenterology | Admitting: Gastroenterology

## 2022-07-04 ENCOUNTER — Encounter (HOSPITAL_COMMUNITY): Payer: Self-pay

## 2022-07-09 ENCOUNTER — Ambulatory Visit (HOSPITAL_BASED_OUTPATIENT_CLINIC_OR_DEPARTMENT_OTHER): Payer: Medicare Other | Admitting: Anesthesiology

## 2022-07-09 ENCOUNTER — Ambulatory Visit (HOSPITAL_COMMUNITY): Payer: Medicare Other | Admitting: Anesthesiology

## 2022-07-09 ENCOUNTER — Telehealth (INDEPENDENT_AMBULATORY_CARE_PROVIDER_SITE_OTHER): Payer: Self-pay

## 2022-07-09 ENCOUNTER — Encounter (HOSPITAL_COMMUNITY)
Admission: RE | Disposition: A | Discharge status: Home / Self Care | Payer: Self-pay | Source: Home / Self Care | Attending: Gastroenterology

## 2022-07-09 ENCOUNTER — Ambulatory Visit (HOSPITAL_COMMUNITY)
Admission: RE | Admit: 2022-07-09 | Discharge: 2022-07-09 | Disposition: A | Discharge status: Home / Self Care | Payer: Medicare Other | Attending: Gastroenterology | Admitting: Gastroenterology

## 2022-07-09 ENCOUNTER — Encounter (HOSPITAL_COMMUNITY): Payer: Self-pay | Admitting: Gastroenterology

## 2022-07-09 DIAGNOSIS — K2289 Other specified disease of esophagus: Secondary | ICD-10-CM | POA: Insufficient documentation

## 2022-07-09 DIAGNOSIS — Z7984 Long term (current) use of oral hypoglycemic drugs: Secondary | ICD-10-CM | POA: Insufficient documentation

## 2022-07-09 DIAGNOSIS — I8500 Esophageal varices without bleeding: Secondary | ICD-10-CM | POA: Insufficient documentation

## 2022-07-09 DIAGNOSIS — I10 Essential (primary) hypertension: Secondary | ICD-10-CM | POA: Insufficient documentation

## 2022-07-09 DIAGNOSIS — Z87891 Personal history of nicotine dependence: Secondary | ICD-10-CM | POA: Diagnosis not present

## 2022-07-09 DIAGNOSIS — Z85038 Personal history of other malignant neoplasm of large intestine: Secondary | ICD-10-CM | POA: Diagnosis not present

## 2022-07-09 DIAGNOSIS — E119 Type 2 diabetes mellitus without complications: Secondary | ICD-10-CM | POA: Insufficient documentation

## 2022-07-09 DIAGNOSIS — K219 Gastro-esophageal reflux disease without esophagitis: Secondary | ICD-10-CM | POA: Insufficient documentation

## 2022-07-09 DIAGNOSIS — Z8679 Personal history of other diseases of the circulatory system: Secondary | ICD-10-CM | POA: Insufficient documentation

## 2022-07-09 HISTORY — PX: ESOPHAGOGASTRODUODENOSCOPY (EGD) WITH PROPOFOL: SHX5813

## 2022-07-09 LAB — GLUCOSE, CAPILLARY: Glucose-Capillary: 136 mg/dL — ABNORMAL HIGH (ref 70–99)

## 2022-07-09 SURGERY — ESOPHAGOGASTRODUODENOSCOPY (EGD) WITH PROPOFOL
Anesthesia: General

## 2022-07-09 MED ORDER — PROPOFOL 10 MG/ML IV BOLUS
INTRAVENOUS | Status: DC | PRN
  Administered 2022-07-09: 50 mg via INTRAVENOUS
  Administered 2022-07-09: 100 mg via INTRAVENOUS
  Administered 2022-07-09: 50 mg via INTRAVENOUS
  Administered 2022-07-09: 150 mg via INTRAVENOUS

## 2022-07-09 MED ORDER — LACTATED RINGERS IV SOLN: INTRAVENOUS | Status: DC

## 2022-07-09 MED ORDER — LIDOCAINE HCL (CARDIAC) PF 50 MG/5ML IV SOSY
PREFILLED_SYRINGE | INTRAVENOUS | Status: DC | PRN
  Administered 2022-07-09: 100 mg via INTRAVENOUS

## 2022-07-09 NOTE — Anesthesia Postprocedure Evaluation (Signed)
Anesthesia Post Note  Patient: Justin Berger  Procedure(s) Performed: ESOPHAGOGASTRODUODENOSCOPY (EGD) WITH PROPOFOL  Patient location during evaluation: Phase II Anesthesia Type: General Level of consciousness: awake and alert Pain management: pain level controlled Vital Signs Assessment: post-procedure vital signs reviewed and stable Respiratory status: spontaneous breathing, nonlabored ventilation, respiratory function stable and patient connected to nasal cannula oxygen Cardiovascular status: blood pressure returned to baseline and stable Postop Assessment: no apparent nausea or vomiting Anesthetic complications: no   There were no known notable events for this encounter.   Last Vitals:  Vitals:   07/09/22 1036 07/09/22 1217  BP: 137/90 134/69  Pulse:  72  Resp: 17 20  Temp: 36.6 C 36.7 C  SpO2: 100% 96%    Last Pain:  Vitals:   07/09/22 1217  TempSrc: Oral  PainSc: 0-No pain                 Trixie Rude

## 2022-07-09 NOTE — Discharge Instructions (Signed)
You are being discharged to home.  Resume your previous diet.  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 Continue omeprazole 40 mg twice a day.

## 2022-07-09 NOTE — Telephone Encounter (Signed)
Trapped air in chest after EGD this morning. Patient states he is uncomfortable. He says he has called Endo and the nurse told him she would have Dr. Jenetta Downer return call to him.

## 2022-07-09 NOTE — Transfer of Care (Signed)
Immediate Anesthesia Transfer of Care Note  Patient: LARREN COPES  Procedure(s) Performed: ESOPHAGOGASTRODUODENOSCOPY (EGD) WITH PROPOFOL  Patient Location: Short Stay  Anesthesia Type:General  Level of Consciousness: sedated and patient cooperative  Airway & Oxygen Therapy: Patient Spontanous Breathing  Post-op Assessment: Report given to RN and Post -op Vital signs reviewed and stable  Post vital signs: Reviewed and stable  Last Vitals:  Vitals Value Taken Time  BP 134/69 07/09/22 1217  Temp 98 1219  Pulse 72 07/09/22 1217  Resp 20 07/09/22 1217  SpO2 96 % 07/09/22 1217    Last Pain:  Vitals:   07/09/22 1217  TempSrc: Oral  PainSc: 0-No pain      Patients Stated Pain Goal: 9 (78/47/84 1282)  Complications: No notable events documented.

## 2022-07-09 NOTE — Anesthesia Preprocedure Evaluation (Signed)
Anesthesia Evaluation  Patient identified by MRN, date of birth, ID band Patient awake    Reviewed: Allergy & Precautions, NPO status , Patient's Chart, lab work & pertinent test results  Airway Mallampati: II  TM Distance: >3 FB Neck ROM: Full    Dental  (+) Upper Dentures,    Pulmonary neg pulmonary ROS, former smoker,    Pulmonary exam normal        Cardiovascular hypertension, negative cardio ROS Normal cardiovascular exam     Neuro/Psych negative neurological ROS     GI/Hepatic Neg liver ROS, GERD  ,  Endo/Other  negative endocrine ROSdiabetes  Renal/GU negative Renal ROS     Musculoskeletal negative musculoskeletal ROS (+)   Abdominal (+) + obese,   Peds  Hematology  (+) Blood dyscrasia, anemia ,   Anesthesia Other Findings   Reproductive/Obstetrics                             Anesthesia Physical Anesthesia Plan  ASA: 2  Anesthesia Plan: General   Post-op Pain Management:    Induction: Intravenous  PONV Risk Score and Plan: 1 and TIVA  Airway Management Planned: Nasal Cannula  Additional Equipment:   Intra-op Plan:   Post-operative Plan:   Informed Consent: I have reviewed the patients History and Physical, chart, labs and discussed the procedure including the risks, benefits and alternatives for the proposed anesthesia with the patient or authorized representative who has indicated his/her understanding and acceptance.     Dental advisory given  Plan Discussed with: CRNA  Anesthesia Plan Comments:         Anesthesia Quick Evaluation

## 2022-07-09 NOTE — Op Note (Addendum)
[facility] Patient Name: Justin Berger Procedure Date: 07/09/2022 11:50 AM MRN: [id number] Date of Birth: December 24, 1951 Attending MD: Maylon Peppers ,  CSN: [id number] Age: 71 Admit Type: Outpatient Procedure:                Upper GI endoscopy Indications:              Follow-up of esophageal varices Providers:                Maylon Peppers, Janeece Riggers, RN, Raphael Gibney,                            Technician Referring MD:              Medicines:                Monitored Anesthesia Care Complications:            No immediate complications. Estimated Blood Loss:     Estimated blood loss: none. Procedure:                Pre-Anesthesia Assessment:                           - Prior to the procedure, a History and Physical                            was performed, and patient medications, allergies                            and sensitivities were reviewed. The patient's                            tolerance of previous anesthesia was reviewed.                           - The risks and benefits of the procedure and the                            sedation options and risks were discussed with the                            patient. All questions were answered and informed                            consent was obtained.                           - ASA Grade Assessment: III - A patient with severe                            systemic disease.                           After obtaining informed consent, the endoscope was                            passed under direct vision. Throughout the  procedure, the patient's blood pressure, pulse, and                            oxygen saturations were monitored continuously. The                            [id number]-H190 ([id number]) scope was introduced through the                            mouth, and advanced to the second part of duodenum.                            The upper GI endoscopy was accomplished without                             difficulty. The patient tolerated the procedure                            well. Scope In: 11:59:25 AM Scope Out: 12:12:42 PM Total Procedure Duration: 0 hours 13 minutes 17 seconds  Findings:      A single 5 mm submucosal nodule was found in the upper third of the       esophagus. This nodule was previously seen in EGDs, possible leiomyoma       vs granular tumor.      Grade II varices with no stigmata of recent bleeding were found in the       entire esophagus. No red wale signs were present. Notably, these venous       prominences were not contiguous and seemed to have an "isolated"       pattern. Three bands were successfully placed with complete eradication,       resulting in deflation of varices. There was no bleeding during the       procedure.      The entire examined stomach was normal. The previously seen venous       prominence in the antrum was milder.      The examined duodenum was normal. Impression:               - Submucosal nodule found in the esophagus.                           - Grade II esophageal varices with no stigmata of                            recent bleeding. Completely eradicated. Banded.                           - Normal stomach.                           - Normal examined duodenum.                           - No specimens collected. Moderate Sedation:      Per Anesthesia Care Recommendation:           - Discharge patient to home (  ambulatory).                           - Resume previous diet.                           - Stop Cialis and repeat EGD in 3 months                           - Will refer to [facility] for further evaluation of                            esophageal varices not related to portal                            hypertension and esophageal nodule.                           - Continue omeprazole 40 mg twice a day. Procedure Code(s):        --- Professional ---                           [phone], Esophagogastroduodenoscopy,  flexible,                            transoral; with band ligation of esophageal/gastric                            varices Diagnosis Code(s):        --- Professional ---                           K22.8, Other specified diseases of esophagus                           I85.00, Esophageal varices without bleeding CPT copyright 2019 American Medical Association. All rights reserved. The codes documented in this report are preliminary and upon coder review may  be revised to meet current compliance requirements. Maylon Peppers, MD Maylon Peppers,  07/09/2022 12:24:30 PM This report has been signed electronically. Number of Addenda: 0

## 2022-07-09 NOTE — H&P (Signed)
Justin Berger is an 71 y.o. male.   Chief Complaint: Esophageal varices follow-up HPI: Justin Berger is a 71 y.o. male with PMH AAA, DM, colon cancer s/p, essential thrombocytosis, HTN, who presents for follow up of esophageal varices.  Reports feeling well and denies have any complaints. The patient denies having any nausea, vomiting, fever, chills, hematochezia, melena, hematemesis, abdominal distention, abdominal pain, diarrhea, jaundice, pruritus or weight loss.   Past Medical History:  Diagnosis Date   AAA (abdominal aortic aneurysm) without rupture ([place]) 01/2021   Colon cancer ([place]) 2003   Diabetes ([place])    Essential thrombocytosis ([place])    Hypertension    Skin cancer, basal cell    back    Past Surgical History:  Procedure Laterality Date   BIOPSY  12/18/2021   Procedure: BIOPSY;  Surgeon: Harvel Quale, MD;  [facility];  Service: Gastroenterology;;   CATARACT EXTRACTION, BILATERAL     COLON SURGERY  06/23/2002   right colectomy   COLONOSCOPY  08/25/2020   Dr. Adelina Mings, normal colon other than venous lake in Left colon   COLONOSCOPY WITH PROPOFOL N/A 10/23/2021   Procedure: COLONOSCOPY WITH PROPOFOL;  Surgeon: Harvel Quale, MD;  [facility];  Service: Gastroenterology;  Laterality: N/A;  12:30   ESOPHAGEAL BANDING  10/23/2021   Procedure: ESOPHAGEAL BANDING;  Surgeon: Harvel Quale, MD;  [facility];  Service: Gastroenterology;;   ESOPHAGEAL BANDING  12/18/2021   Procedure: ESOPHAGEAL BANDING;  Surgeon: Montez Morita, Quillian Quince, MD;  [facility];  Service: Gastroenterology;;   ESOPHAGEAL BANDING  02/08/2022   Procedure: ESOPHAGEAL BANDING;  Surgeon: Montez Morita, Quillian Quince, MD;  [facility];  Service: Gastroenterology;;   ESOPHAGOGASTRODUODENOSCOPY  05/25/2021   [facility]: moderate inflammation characterized by adherent blood, erythema, and friability in gastric body,  antrum and prepyloric region, as well as 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   ESOPHAGOGASTRODUODENOSCOPY (EGD) WITH PROPOFOL N/A 10/23/2021   Procedure: ESOPHAGOGASTRODUODENOSCOPY (EGD) WITH PROPOFOL;  Surgeon: Harvel Quale, MD;  [facility];  Service: Gastroenterology;  Laterality: N/A;   ESOPHAGOGASTRODUODENOSCOPY (EGD) WITH PROPOFOL N/A 12/18/2021   Procedure: ESOPHAGOGASTRODUODENOSCOPY (EGD) WITH PROPOFOL;  Surgeon: Harvel Quale, MD;  [facility];  Service: Gastroenterology;  Laterality: N/A;  8:05   ESOPHAGOGASTRODUODENOSCOPY (EGD) WITH PROPOFOL N/A 02/08/2022   Procedure: ESOPHAGOGASTRODUODENOSCOPY (EGD) WITH PROPOFOL;  Surgeon: Harvel Quale, MD;  [facility];  Service: Gastroenterology;  Laterality: N/A;  [place]  02/08/2022   Procedure: HEMOSTASIS CLIP PLACEMENT;  Surgeon: Harvel Quale, MD;  [facility];  Service: Gastroenterology;;   IR TRANSCATHETER BX  12/03/2021   IR US GUIDE VASC ACCESS RIGHT  12/03/2021   POLYPECTOMY  10/23/2021   Procedure: POLYPECTOMY INTESTINAL;  Surgeon: Harvel Quale, MD;  [facility];  Service: Gastroenterology;;   POLYPECTOMY  02/08/2022   Procedure: POLYPECTOMY;  Surgeon: Harvel Quale, MD;  [facility];  Service: Gastroenterology;;   TONSILLECTOMY      Family History  Problem Relation Age of Onset   Dementia Mother    Cancer Paternal Uncle        lung   Social History:  reports that he quit smoking about 44 years ago. His smoking use included cigarettes. He has a 1.50 pack-year smoking history. He has never used smokeless tobacco. He reports that  he does not drink alcohol and does not use drugs.  Allergies:  Allergies  Allergen Reactions   Ivp Dye  [Iodinated Contrast Media]     Heart stopped     Medications Prior to Admission  Medication Sig  Dispense Refill   ACCU-CHEK GUIDE test strip SMARTSIG:Strip(s) Via Meter Daily     Accu-Chek Softclix Lancets lancets USE TO TEST BLOOD SUGAR DAILY     acetaminophen (TYLENOL) 500 MG tablet Take 1,000 mg by mouth every 6 (six) hours as needed for moderate pain or headache.     allopurinol (ZYLOPRIM) 300 MG tablet Take 300 mg by mouth in the morning.     Ascorbic Acid (VITAMIN C PO) Take 2,000 mg by mouth daily as needed (during winter season).     aspirin 81 MG EC tablet Take 81 mg by mouth in the morning.     atorvastatin (LIPITOR) 10 MG tablet Take 10 mg by mouth every Sunday.     Blood Glucose Monitoring Suppl (ACCU-CHEK GUIDE ME) w/Device KIT USE TO TEST FASTING SUGAR ONCE DAILY     Cholecalciferol (VITAMIN D3 PO) Take 2 capsules by mouth in the morning.     clonazePAM (KLONOPIN) 0.5 MG tablet Take 0.5 mg by mouth daily as needed for anxiety.     DULoxetine (CYMBALTA) 60 MG capsule Take 60 mg by mouth in the morning.     fluticasone (FLONASE) 50 MCG/ACT nasal spray Place 1 spray into both nostrils daily as needed for allergies or rhinitis.     gabapentin (NEURONTIN) 300 MG capsule Take 900 mg by mouth at bedtime.     glipiZIDE (GLUCOTROL XL) 10 MG 24 hr tablet Take 10 mg by mouth 2 (two) times daily.     hydroxyurea (HYDREA) 500 MG capsule Take 2 capsules (1,000 mg total) by mouth 3 (three) times a week AND 3 capsules (1,500 mg total) 4 (four) times a week. [2 capsules on Tu/Th/Sun, 3 capsules on M/W/F/Sat]. May take with food to minimize GI side effects.. 204 capsule 3   loratadine (CLARITIN) 10 MG tablet Take 10 mg by mouth daily as needed for allergies.     metFORMIN (GLUCOPHAGE-XR) 500 MG 24 hr tablet Take 1,000 mg by mouth in the morning and at bedtime.  6   metoprolol succinate (TOPROL-XL) 50 MG 24 hr tablet Take 50 mg by mouth every evening.     Multiple Vitamin (MULTIVITAMIN WITH MINERALS) TABS tablet Take 1 tablet by mouth in the morning.     omeprazole (PRILOSEC) 40 MG capsule Take  1 capsule (40 mg total) by mouth 2 (two) times daily. 180 capsule 0   sucralfate (CARAFATE) 1 g tablet Take 1 tablet (1 g total) by mouth 2 (two) times daily. 28 tablet 0   zinc gluconate 50 MG tablet Take 50 mg by mouth daily as needed (during the winter season).     tadalafil (CIALIS) 20 MG tablet Take 20 mg by mouth daily as needed for erectile dysfunction.      Results for orders placed or performed during the hospital encounter of 07/09/22 (from the past 48 hour(s))  Glucose, capillary     Status: Abnormal   Collection Time: 07/09/22 10:35 AM  Result Value Ref Range   Glucose-Capillary 136 (H) 70 - 99 mg/dL    Comment: Glucose reference range applies only to samples taken after fasting for at least 8 hours.   No results found.  Review of Systems  All other systems reviewed and are negative.  Blood pressure 137/90, temperature 97.8 F (36.6 C), temperature source Oral, resp. rate 17, SpO2 100 %. Physical Exam  GENERAL: The patient is AO x3, in no acute distress. HEENT: Head is normocephalic and atraumatic. EOMI are intact. Mouth is well hydrated and without lesions. NECK: Supple. No masses LUNGS: Clear to auscultation. No presence of rhonchi/wheezing/rales. Adequate chest expansion HEART: RRR, normal s1 and s2. ABDOMEN: Soft, nontender, no guarding, no peritoneal signs, and nondistended. BS +. No masses. EXTREMITIES: Without any cyanosis, clubbing, rash, lesions or edema. NEUROLOGIC: AOx3, no focal motor deficit. SKIN: no jaundice, no rashes  Assessment/Plan  Justin Berger is a 71 y.o. male with PMH AAA, DM, colon cancer s/p, essential thrombocytosis, HTN, who presents for follow up of esophageal varices.  We will proceed with EGD  Harvel Quale, MD 07/09/2022, 11:18 AM

## 2022-07-09 NOTE — Telephone Encounter (Signed)
I spoke with the patient today, he reports discomfort in his chest as if there was "gas trapped in his chest".  This is likely related to the banding which may improve in the next few days.  I advised him to take Gas-X and to take Cepacol.  He understood and agreed.  He will give [place] a call in the next couple of days to inform us about his symptoms.

## 2022-07-09 NOTE — Anesthesia Procedure Notes (Signed)
Date/Time: 07/09/2022 11:53 AM  Performed by: Vista Deck, CRNAPre-anesthesia Checklist: Patient identified, Emergency Drugs available, Suction available, Timeout performed and Patient being monitored Patient Re-evaluated:Patient Re-evaluated prior to induction Oxygen Delivery Method: Nasal Cannula

## 2022-07-15 ENCOUNTER — Encounter (HOSPITAL_COMMUNITY): Payer: Self-pay | Admitting: Gastroenterology

## 2022-07-18 ENCOUNTER — Other Ambulatory Visit (INDEPENDENT_AMBULATORY_CARE_PROVIDER_SITE_OTHER): Payer: Self-pay

## 2022-07-18 DIAGNOSIS — I8500 Esophageal varices without bleeding: Secondary | ICD-10-CM

## 2022-07-18 DIAGNOSIS — K219 Gastro-esophageal reflux disease without esophagitis: Secondary | ICD-10-CM

## 2022-07-18 MED ORDER — OMEPRAZOLE 40 MG PO CPDR: 40.0000 mg | DELAYED_RELEASE_CAPSULE | Freq: Two times a day (BID) | ORAL | 0 refills | Status: DC

## 2022-07-31 DIAGNOSIS — M6740 Ganglion, unspecified site: Secondary | ICD-10-CM | POA: Diagnosis not present

## 2022-07-31 DIAGNOSIS — Z6834 Body mass index (BMI) 34.0-34.9, adult: Secondary | ICD-10-CM | POA: Diagnosis not present

## 2022-08-02 DIAGNOSIS — H26493 Other secondary cataract, bilateral: Secondary | ICD-10-CM | POA: Diagnosis not present

## 2022-08-02 DIAGNOSIS — H40013 Open angle with borderline findings, low risk, bilateral: Secondary | ICD-10-CM | POA: Diagnosis not present

## 2022-08-02 DIAGNOSIS — H34832 Tributary (branch) retinal vein occlusion, left eye, with macular edema: Secondary | ICD-10-CM | POA: Diagnosis not present

## 2022-08-02 DIAGNOSIS — E119 Type 2 diabetes mellitus without complications: Secondary | ICD-10-CM | POA: Diagnosis not present

## 2022-08-21 DIAGNOSIS — H40013 Open angle with borderline findings, low risk, bilateral: Secondary | ICD-10-CM | POA: Diagnosis not present

## 2022-08-21 DIAGNOSIS — H34832 Tributary (branch) retinal vein occlusion, left eye, with macular edema: Secondary | ICD-10-CM | POA: Diagnosis not present

## 2022-08-21 DIAGNOSIS — H26493 Other secondary cataract, bilateral: Secondary | ICD-10-CM | POA: Diagnosis not present

## 2022-08-21 DIAGNOSIS — E119 Type 2 diabetes mellitus without complications: Secondary | ICD-10-CM | POA: Diagnosis not present

## 2022-08-21 NOTE — Progress Notes (Unsigned)
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 male returns for routine follow-up of his CALR essential thrombocytosis and iron deficiency anemia.  He was last seen by Tarri Abernethy PA-C on 02/12/2022.  At today's visit, he reports feeling fair.  ***  He had a recent EGD on 07/09/2022 with banding of grade II esophageal varices. ***He denies any other hospital stays, procedures, or changes in his baseline health status.  He is tolerating his Hydrea well. *** *** He has some chronic diarrhea, but denies any other GI symptoms.  *** No cutaneous ulcers, nonhealing wounds, or mouth sores.  He has not noticed any recent melena, hematochezia, or epistaxis.  *** ***He does continue to have some mild to moderate fatigue and intermittent lightheadedness.  *** He denies any pica, restless legs, headaches, chest pain, dyspnea on exertion, or syncope.   No current signs or symptoms of blood clots such as unilateral leg swelling, new onset chest pain, dyspnea, or hemoptysis.  *** ***He denies any aquagenic pruritus or erythromelalgia, but does report that he has intermittent bluish discoloration of his palms especially after showering, which is nonpainful.  *** Chronic neuropathy at baseline.   ***He has some occasional dizziness. ***  No other vasomotor symptoms such as tinnitus, blurry vision, or strokelike symptoms.  No B symptoms such as fever, chills, night sweats, unintentional weight loss.  He denies any abdominal pain, nausea, early satiety.   He has ***% energy and ***% appetite. He endorses that he is maintaining  a stable weight.   REVIEW OF SYSTEMS: *** Review of Systems  Constitutional:  Positive for fatigue. Negative for appetite change, chills, diaphoresis, fever and unexpected weight change.  HENT:   Negative for lump/mass and nosebleeds.   Eyes:  Negative for eye problems.  Respiratory:  Negative for cough, hemoptysis and shortness of breath.   Cardiovascular:  Negative for chest pain, leg swelling and palpitations.  Gastrointestinal:  Negative for abdominal pain, blood in stool, constipation, diarrhea, nausea and vomiting.  Genitourinary:  Negative for hematuria.   Musculoskeletal:  Positive for gait problem.  Skin: Negative.   Neurological:  Positive for gait problem. Negative for dizziness, headaches and light-headedness.  Hematological:  Does not bruise/bleed easily.  Psychiatric/Behavioral:  Positive for depression.       PAST MEDICAL/SURGICAL HISTORY:  Past Medical History:  Diagnosis Date   AAA (abdominal aortic aneurysm) without rupture ([place]) 01/2021   Colon cancer ([place]) 2003   Diabetes ([place])    Essential thrombocytosis ([place])    Hypertension    Skin cancer, basal cell    back   Past Surgical History:  Procedure Laterality Date   BIOPSY  12/18/2021   Procedure: BIOPSY;  Surgeon: Harvel Quale, MD;  [facility];  Service: Gastroenterology;;   CATARACT EXTRACTION, BILATERAL     COLON SURGERY  06/23/2002   right colectomy   COLONOSCOPY  08/25/2020   Dr. Adelina Mings, normal colon other than venous lake in Left colon   COLONOSCOPY WITH PROPOFOL N/A 10/23/2021   Procedure: COLONOSCOPY WITH PROPOFOL;  Surgeon: Harvel Quale, MD;  [facility];  Service: Gastroenterology;  Laterality: N/A;  12:30   ESOPHAGEAL BANDING  10/23/2021   Procedure: ESOPHAGEAL BANDING;  Surgeon: Harvel Quale, MD;  [facility];  Service: Gastroenterology;;   ESOPHAGEAL BANDING  12/18/2021   Procedure: ESOPHAGEAL BANDING;   Surgeon: Montez Morita, Quillian Quince, MD;  [facility];  Service: Gastroenterology;;   ESOPHAGEAL BANDING  02/08/2022   Procedure: ESOPHAGEAL BANDING;  Surgeon: Montez Morita, Quillian Quince, MD;  [facility];  Service: Gastroenterology;;   ESOPHAGOGASTRODUODENOSCOPY  05/25/2021   [facility]: moderate inflammation characterized by adherent blood, erythema, and friability in gastric body, antrum and prepyloric region, as well as 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   ESOPHAGOGASTRODUODENOSCOPY (EGD) WITH PROPOFOL N/A 10/23/2021   Procedure: ESOPHAGOGASTRODUODENOSCOPY (EGD) WITH PROPOFOL;  Surgeon: Harvel Quale, MD;  [facility];  Service: Gastroenterology;  Laterality: N/A;   ESOPHAGOGASTRODUODENOSCOPY (EGD) WITH PROPOFOL N/A 12/18/2021   Procedure: ESOPHAGOGASTRODUODENOSCOPY (EGD) WITH PROPOFOL;  Surgeon: Harvel Quale, MD;  [facility];  Service: Gastroenterology;  Laterality: N/A;  8:05   ESOPHAGOGASTRODUODENOSCOPY (EGD) WITH PROPOFOL N/A 02/08/2022   Procedure: ESOPHAGOGASTRODUODENOSCOPY (EGD) WITH PROPOFOL;  Surgeon: Harvel Quale, MD;  [facility];  Service: Gastroenterology;  Laterality: N/A;  1055   ESOPHAGOGASTRODUODENOSCOPY (EGD) WITH PROPOFOL N/A 07/09/2022   Procedure: ESOPHAGOGASTRODUODENOSCOPY (EGD) WITH PROPOFOL;  Surgeon: Harvel Quale, MD;  [facility];  Service: Gastroenterology;  Laterality: N/A;  1130 ASA 2   HEMOSTASIS CLIP PLACEMENT  02/08/2022   Procedure: HEMOSTASIS CLIP PLACEMENT;  Surgeon: Harvel Quale, MD;  [facility];  Service: Gastroenterology;;   IR TRANSCATHETER BX  12/03/2021   IR US GUIDE VASC ACCESS RIGHT  12/03/2021   POLYPECTOMY  10/23/2021   Procedure: POLYPECTOMY INTESTINAL;  Surgeon: Harvel Quale, MD;  [facility];  Service: Gastroenterology;;   POLYPECTOMY  02/08/2022    Procedure: POLYPECTOMY;  Surgeon: Montez Morita, Quillian Quince, MD;  [facility];  Service: Gastroenterology;;   TONSILLECTOMY       SOCIAL HISTORY:  Social History   Socioeconomic History   Marital status: Married    Spouse name: Not on file   Number of children: Not on file   Years of education: Not on file   Highest education level: Not on file  Occupational History   Not on file  Tobacco Use   Smoking status: Former    Packs/day: 0.50    Years: 3.00    Total pack years: 1.50    Types: Cigarettes    Quit date: 04/08/1978    Years since quitting: 44.4   Smokeless tobacco: Never  Vaping Use   Vaping Use: Never used  Substance and Sexual Activity   Alcohol use: Never   Drug use: Never   Sexual activity: Not on file  Other Topics Concern   Not on file  Social History Narrative   Not on file   Social Determinants of Health   Financial Resource Strain: Low Risk  (11/29/2020)   Overall Financial Resource Strain (CARDIA)    Difficulty of Paying Living Expenses: Not hard at all  Food Insecurity: No Food Insecurity (11/29/2020)   Hunger Vital Sign    Worried About Running Out of Food in the Last Year: Never true    Ran Out of Food in the Last Year: Never true  Transportation Needs: No Transportation Needs (11/29/2020)  PRAPARE - Hydrologist (Medical): No    Lack of Transportation (Non-Medical): No  Physical Activity: Inactive (11/29/2020)   Exercise Vital Sign    Days of Exercise per Week: 0 days    Minutes of Exercise per Session: 0 min  Stress: No Stress Concern Present (11/29/2020)   [place]    Feeling of Stress : Not at all  Social Connections: Moderately Integrated (11/29/2020)   Social Connection and Isolation Panel [NHANES]    Frequency of Communication with Friends and Family: More than three times a week    Frequency of Social Gatherings with Friends and  Family: Three times a week    Attends Religious Services: 1 to 4 times per year    Active Member of Clubs or Organizations: No    Attends Archivist Meetings: Never    Marital Status: Married  Human resources officer Violence: Not At Risk (11/29/2020)   Humiliation, Afraid, Rape, and Kick questionnaire    Fear of Current or Ex-Partner: No    Emotionally Abused: No    Physically Abused: No    Sexually Abused: No    FAMILY HISTORY:  Family History  Problem Relation Age of Onset   Dementia Mother    Cancer Paternal Uncle        lung    CURRENT MEDICATIONS:  Outpatient Encounter Medications as of 08/22/2022  Medication Sig Note   ACCU-CHEK GUIDE test strip SMARTSIG:Strip(s) Via Meter Daily    Accu-Chek Softclix Lancets lancets USE TO TEST BLOOD SUGAR DAILY    acetaminophen (TYLENOL) 500 MG tablet Take 1,000 mg by mouth every 6 (six) hours as needed for moderate pain or headache.    allopurinol (ZYLOPRIM) 300 MG tablet Take 300 mg by mouth in the morning.    Ascorbic Acid (VITAMIN C PO) Take 2,000 mg by mouth daily as needed (during winter season).    aspirin 81 MG EC tablet Take 81 mg by mouth in the morning.    atorvastatin (LIPITOR) 10 MG tablet Take 10 mg by mouth every Sunday.    Blood Glucose Monitoring Suppl (ACCU-CHEK GUIDE ME) w/Device KIT USE TO TEST FASTING SUGAR ONCE DAILY    Cholecalciferol (VITAMIN D3 PO) Take 2 capsules by mouth in the morning.    clonazePAM (KLONOPIN) 0.5 MG tablet Take 0.5 mg by mouth daily as needed for anxiety.    DULoxetine (CYMBALTA) 60 MG capsule Take 60 mg by mouth in the morning.    fluticasone (FLONASE) 50 MCG/ACT nasal spray Place 1 spray into both nostrils daily as needed for allergies or rhinitis.    gabapentin (NEURONTIN) 300 MG capsule Take 900 mg by mouth at bedtime.    glipiZIDE (GLUCOTROL XL) 10 MG 24 hr tablet Take 10 mg by mouth 2 (two) times daily.    hydroxyurea (HYDREA) 500 MG capsule Take 2 capsules (1,000 mg total) by mouth 3  (three) times a week AND 3 capsules (1,500 mg total) 4 (four) times a week. [2 capsules on Tu/Th/Sun, 3 capsules on M/W/F/Sat]. May take with food to minimize GI side effects.[name]    loratadine (CLARITIN) 10 MG tablet Take 10 mg by mouth daily as needed for allergies.    metFORMIN (GLUCOPHAGE-XR) 500 MG 24 hr tablet Take 1,000 mg by mouth in the morning and at bedtime.    metoprolol succinate (TOPROL-XL) 50 MG 24 hr tablet Take 50 mg by mouth every evening. 06/28/2022: Pharmacy records show  the most recent fill on 04/04/2022 #90 was a 100 mg but pt and his wife insists that he is taking the 50 mg tablet   Multiple Vitamin (MULTIVITAMIN WITH MINERALS) TABS tablet Take 1 tablet by mouth in the morning.    omeprazole (PRILOSEC) 40 MG capsule Take 1 capsule (40 mg total) by mouth 2 (two) times daily.    sucralfate (CARAFATE) 1 g tablet Take 1 tablet (1 g total) by mouth 2 (two) times daily.    tadalafil (CIALIS) 20 MG tablet Take 20 mg by mouth daily as needed for erectile dysfunction.    zinc gluconate 50 MG tablet Take 50 mg by mouth daily as needed (during the winter season).    No facility-administered encounter medications on file as of 08/22/2022.    ALLERGIES:  Allergies  Allergen Reactions   Ivp Dye  [Iodinated Contrast Media]     Heart stopped      PHYSICAL EXAM: *** ECOG PERFORMANCE STATUS: 2 - Symptomatic, <50% confined to bed  There were no vitals filed for this visit. There were no vitals filed for this visit. Physical Exam Constitutional:      Appearance: Normal appearance. He is obese.  HENT:     Head: Normocephalic and atraumatic.     Mouth/Throat:     Mouth: Mucous membranes are moist.  Eyes:     Extraocular Movements: Extraocular movements intact.     Pupils: Pupils are equal, round, and reactive to light.  Cardiovascular:     Rate and Rhythm: Normal rate and regular rhythm.     Pulses: Normal pulses.     Heart sounds: Normal heart sounds.  Pulmonary:     Effort:  Pulmonary effort is normal.     Breath sounds: Normal breath sounds.  Abdominal:     General: Abdomen is protuberant. Bowel sounds are normal.     Palpations: Abdomen is soft.     Tenderness: There is no abdominal tenderness.  Musculoskeletal:        General: No swelling.     Right lower leg: No edema.     Left lower leg: No edema.  Lymphadenopathy:     Cervical: No cervical adenopathy.  Skin:    General: Skin is warm and dry.  Neurological:     General: No focal deficit present.     Mental Status: He is alert and oriented to person, place, and time.  Psychiatric:        Mood and Affect: Mood normal.        Behavior: Behavior normal.     LABORATORY DATA:  I have reviewed the labs as listed.  CBC    Component Value Date/Time   WBC 4.3 05/10/2022 0939   RBC 3.18 (L) 05/10/2022 0939   HGB 10.4 (L) 05/10/2022 0939   HCT 33.4 (L) 05/10/2022 0939   PLT 467 (H) 05/10/2022 0939   MCV 105.0 (H) 05/10/2022 0939   MCH 32.7 05/10/2022 0939   MCHC 31.1 05/10/2022 0939   RDW 20.7 (H) 05/10/2022 0939   LYMPHSABS 1.0 05/10/2022 0939   MONOABS 0.4 05/10/2022 0939   EOSABS 0.1 05/10/2022 0939   BASOSABS 0.1 05/10/2022 0939      Latest Ref Rng & Units 05/10/2022    9:39 AM 02/12/2022    8:50 AM 02/06/2022    1:38 PM  CMP  Glucose 70 - 99 mg/dL 201  165  128   BUN 8 - 23 mg/dL _0 Creatinine  0.61 - 1.24 mg/dL 1.27  1.03  1.03   Sodium 135 - 145 mmol/L 139  137  136   Potassium 3.5 - 5.1 mmol/L 4.3  4.0  3.8   Chloride 98 - 111 mmol/L 109  104  106   CO2 22 - 32 mmol/L _0 Calcium 8.9 - 10.3 mg/dL 9.0  9.2  9.2   Total Protein 6.5 - 8.1 g/dL 6.5  6.7  6.6   Total Bilirubin 0.3 - 1.2 mg/dL 0.5  0.7  0.9   Alkaline Phos 38 - 126 U/L 46  49  38   AST 15 - 41 U/L _1 ALT 0 - 44 U/L _2 DIAGNOSTIC IMAGING:  I have independently reviewed the relevant imaging and discussed with the patient.  ASSESSMENT & PLAN: 1.  Essential thrombocytosis,  CALR+: - High risk disease with history of thrombosis (left leg DVT) and age > 71 - Bone marrow biopsy 12/10/2017 by Dr. Delton Coombes at [facility] in [place], [place] Flow cytometty for leukemia/lymphoma was non-diagnostic Pathology review showed 60% cellularity with significant megakaryocytic hyperplasia with clusters of megakaryocytes; reticulin stain shows slight (Grade 1) reticulin fibrosis, some centered around areas of megakaryocyte clusters Cytogenic analysis showed normal male karyotype 46,XY[20] Noted to be at risk for progression to post essential thrombocythemia related myelofibrosis, but peripheral blood did not show other features of myelofibrosis, such as leukoerythroblastic blood or atypical megakaryocyte morphology - Intermittent bluish discoloration of bilateral palms, nonpainful.  No aquagenic pruritus or erythromelalgias.  Mild vasomotor symptoms such as intermittent blurry vision; no tinnitus, headache, strokelike symptoms. *** - Hydroxyurea started in December 2018 - current dose 1000 mg TTSun and 1500 mg MWFSat. - He is on aspirin 81 mg daily. - He is tolerating Hydrea well without side effects apart from chronic mild diarrhea. *** - CT abdomen and pelvis on 09/29/2017 showed splenomegaly with spleen 14.5 cm.  Abdominal ultrasound (11/02/2021): Stable splenomegaly with spleen 14.3 cm.  MRI abdomen (11/20/2021): No splenomegaly noted. - No palpable adenopathy or splenomegaly on exam *** - Labs today (08/22/2022): *** - Goal is platelets < 500 - PLAN: We will continue his Hydrea to 1000 mg TTSun and 1500 mg MWFSat.  *** - Continue aspirin 81 mg daily. - Repeat labs and RTC in 3 months. ***   2.  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   -- EGD (05/25/2021 at [facility]): Moderate inflammation characterized by adherent blood, erythema, and friability in the gastric body, antrum, and prepyloric region, as well as 3 columns of 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 and distal esophagus. -- He has had multiple EGDs with banding of varices since May 2022 - most recently on 07/09/2022 (banding of grade II esophageal varices by Dr. Jenetta Downer)  - Laboratory work-up otherwise revealed LDH was slightly elevated at 245.  Erythropoietin was  elevated at 67.2.  Reticulocytes 2.9%, with reticulocyte index < 2 indicating hypoproliferation.   Peripheral smear showed macrocytic anemia, neutrophilic left shift, and  thrombocytosis.  SPEP/IFE were normal.  Hemolysis labs unremarkable. - Differential diagnosis favors multifactorial anemia due to chronic GI blood loss, myelosuppressive effects of hydroxyurea,  and anemia related to chronic disease - Most recent labs(08/22/2022): *** - Denies any gross signs or symptoms of blood loss such as hematemesis, hematochezia, or melena     *** - Symptomatic with ongoing chronic fatigue  ***  - PLAN: No indication for IV iron at this time. *** -  Recommend continued follow-up with gastroenterology for suspected GI blood loss and liver disease. *** - Continue current dose of Hydrea as above.  Would consider decrease dose if Hgb less than 10.*** - Repeat CBC and iron studies in 3 months.     3. Personal history of colon cancer - Patient reports history of stage II colon cancer in ~ 2004 - Underwent right hemicolectomy and chemotherapy with Leukovorin and 5FU - Most recent colonoscopy in August 2021 was normal, due for repeat colonoscopy in 5 years   4. Personal history of melanoma - Isolated lesion of right leg melanoma s/p surgical excision "many years ago"    All questions were answered. The patient knows to call the clinic with any problems, questions or concerns.  Medical decision making:  Moderate ***  Time spent on visit: I spent 20 minutes counseling the patient face to face. The total time spent in the appointment was 30 minutes and more than 50% was on counseling.   Harriett Rush, PA-C  ***

## 2022-08-22 ENCOUNTER — Inpatient Hospital Stay: Payer: Medicare Other | Attending: Physician Assistant | Admitting: Physician Assistant

## 2022-08-22 ENCOUNTER — Inpatient Hospital Stay: Payer: Medicare Other

## 2022-08-22 ENCOUNTER — Encounter: Payer: Self-pay | Admitting: Physician Assistant

## 2022-08-22 VITALS — BP 132/78 | HR 63 | Temp 97.7°F | Resp 16 | Ht 70.0 in | Wt 240.5 lb

## 2022-08-22 DIAGNOSIS — D473 Essential (hemorrhagic) thrombocythemia: Secondary | ICD-10-CM | POA: Diagnosis not present

## 2022-08-22 DIAGNOSIS — Z79899 Other long term (current) drug therapy: Secondary | ICD-10-CM | POA: Insufficient documentation

## 2022-08-22 DIAGNOSIS — D500 Iron deficiency anemia secondary to blood loss (chronic): Secondary | ICD-10-CM

## 2022-08-22 DIAGNOSIS — E538 Deficiency of other specified B group vitamins: Secondary | ICD-10-CM

## 2022-08-22 DIAGNOSIS — K922 Gastrointestinal hemorrhage, unspecified: Secondary | ICD-10-CM | POA: Insufficient documentation

## 2022-08-22 DIAGNOSIS — Z85828 Personal history of other malignant neoplasm of skin: Secondary | ICD-10-CM | POA: Diagnosis not present

## 2022-08-22 DIAGNOSIS — D539 Nutritional anemia, unspecified: Secondary | ICD-10-CM

## 2022-08-22 DIAGNOSIS — Z85038 Personal history of other malignant neoplasm of large intestine: Secondary | ICD-10-CM | POA: Diagnosis not present

## 2022-08-22 LAB — COMPREHENSIVE METABOLIC PANEL
ALT: 17 U/L (ref 0–44)
AST: 13 U/L — ABNORMAL LOW (ref 15–41)
Albumin: 4.4 g/dL (ref 3.5–5.0)
Alkaline Phosphatase: 35 U/L — ABNORMAL LOW (ref 38–126)
Anion gap: 8 (ref 5–15)
BUN: 18 mg/dL (ref 8–23)
CO2: 24 mmol/L (ref 22–32)
Calcium: 9.3 mg/dL (ref 8.9–10.3)
Chloride: 106 mmol/L (ref 98–111)
Creatinine, Ser: 1.03 mg/dL (ref 0.61–1.24)
GFR, Estimated: >60 mL/min (ref >60)
Glucose, Bld: 161 mg/dL — ABNORMAL HIGH (ref 70–99)
Potassium: 4.3 mmol/L (ref 3.5–5.1)
Sodium: 138 mmol/L (ref 135–145)
Total Bilirubin: 1 mg/dL (ref 0.3–1.2)
Total Protein: 6.9 g/dL (ref 6.5–8.1)

## 2022-08-22 LAB — CBC WITH DIFFERENTIAL/PLATELET
Abs Immature Granulocytes: 0.25 10*3/uL — ABNORMAL HIGH (ref 0.00–0.07)
Basophils Absolute: 0.1 10*3/uL (ref 0.0–0.1)
Basophils Relative: 1 %
Eosinophils Absolute: 0 10*3/uL (ref 0.0–0.5)
Eosinophils Relative: 1 %
HCT: 35.2 % — ABNORMAL LOW (ref 39.0–52.0)
Hemoglobin: 11.1 g/dL — ABNORMAL LOW (ref 13.0–17.0)
Immature Granulocytes: 5 %
Lymphocytes Relative: 20 %
Lymphs Abs: 0.9 10*3/uL (ref 0.7–4.0)
MCH: 32.9 pg (ref 26.0–34.0)
MCHC: 31.5 g/dL (ref 30.0–36.0)
MCV: 104.5 fL — ABNORMAL HIGH (ref 80.0–100.0)
Monocytes Absolute: 0.5 10*3/uL (ref 0.1–1.0)
Monocytes Relative: 10 %
Neutro Abs: 2.9 10*3/uL (ref 1.7–7.7)
Neutrophils Relative %: 63 %
Platelets: 431 10*3/uL — ABNORMAL HIGH (ref 150–400)
RBC: 3.37 MIL/uL — ABNORMAL LOW (ref 4.22–5.81)
RDW: 19.1 % — ABNORMAL HIGH (ref 11.5–15.5)
WBC: 4.7 10*3/uL (ref 4.0–10.5)
nRBC: 0.6 % — ABNORMAL HIGH (ref 0.0–0.2)

## 2022-08-22 LAB — LACTATE DEHYDROGENASE: LDH: 186 U/L (ref 98–192)

## 2022-08-22 LAB — IRON AND TIBC
Iron: 112 ug/dL (ref 45–182)
Saturation Ratios: 25 % (ref 17.9–39.5)
TIBC: 440 ug/dL (ref 250–450)
UIBC: 328 ug/dL

## 2022-08-22 LAB — FERRITIN: Ferritin: 158 ng/mL (ref 24–336)

## 2022-08-22 NOTE — Patient Instructions (Addendum)
[place] at [facility] Discharge Instructions  You were seen today by Tarri Abernethy PA-C for your elevated platelets (essential thrombocytosis) and your anemia.  Continue your current dose of Hydrea.   Take 3 tablets (1500 mg) on Monday, Wednesday, Friday, Saturday Take 2 tablets (1000 mg) on Tuesday, Thursday, Sunday  Continue to take your aspirin 81 mg daily.  You continue to have mild anemia, which may be related to your Hydrea or related to possible blood loss from esophageal varices.  We will continue to monitor your blood counts, but for the time being they are good enough to continue your Hydrea.  Continue follow-up with GI doctors regarding esophageal varices.  Your iron labs are not back yet.  I will send you a MyChart message once I have those results.  LABS: Return in 3 months for repeat labs  FOLLOW-UP APPOINTMENT: Office visit in 3 months  ** Thank you for trusting me with your healthcare!  I strive to provide all of my patients with quality care at each visit.  If you receive a survey for this visit, I would be so grateful to you for taking the time to provide feedback.  Thank you in advance!  ~ Bashir Marchetti                   Dr. Derek Jack   &   Tarri Abernethy, PA-C   - - - - - - - - - - - - - - - - - -     Thank you for choosing [place] at [facility] to provide your oncology and hematology care.  To afford each patient quality time with our provider, please arrive at least 15 minutes before your scheduled appointment time.   If you have a lab appointment with the [facility] please come in thru the Main Entrance and check in at the main information desk.  You need to re-schedule your appointment should you arrive 10 or more minutes late.  We strive to give you quality time with our providers, and arriving late affects you and other patients whose appointments are after yours.  Also, if you no show three  or more times for appointments you may be dismissed from the clinic at the providers discretion.     Again, thank you for choosing [facility].  Our hope is that these requests will decrease the amount of time that you wait before being seen by our physicians.       _____________________________________________________________  Should you have questions after your visit to [facility], please contact our office at [phone] and follow the prompts.  Our office hours are 8:00 a.m. and 4:30 p.m. Monday - Friday.  Please note that voicemails left after 4:00 p.m. may not be returned until the following business day.  We are closed weekends and major holidays.  You do have access to a nurse 24-7, just call the main number to the clinic [phone] and do not press any options, hold on the line and a nurse will answer the phone.    For prescription refill requests, have your pharmacy contact our office and allow 72 hours.    Due to Covid, you will need to wear a mask upon entering the hospital. If you do not have a mask, a mask will be given to you at the Main Entrance upon arrival. For doctor visits, patients may have 1 support person age  35 or older with them. For treatment visits, patients can not have anyone with them due to social distancing guidelines and our immunocompromised population.

## 2022-08-26 DIAGNOSIS — D519 Vitamin B12 deficiency anemia, unspecified: Secondary | ICD-10-CM | POA: Diagnosis not present

## 2022-08-26 DIAGNOSIS — D649 Anemia, unspecified: Secondary | ICD-10-CM | POA: Diagnosis not present

## 2022-08-26 DIAGNOSIS — E7849 Other hyperlipidemia: Secondary | ICD-10-CM | POA: Diagnosis not present

## 2022-08-26 DIAGNOSIS — Z1329 Encounter for screening for other suspected endocrine disorder: Secondary | ICD-10-CM | POA: Diagnosis not present

## 2022-08-26 DIAGNOSIS — N400 Enlarged prostate without lower urinary tract symptoms: Secondary | ICD-10-CM | POA: Diagnosis not present

## 2022-08-26 DIAGNOSIS — E039 Hypothyroidism, unspecified: Secondary | ICD-10-CM | POA: Diagnosis not present

## 2022-08-26 DIAGNOSIS — M109 Gout, unspecified: Secondary | ICD-10-CM | POA: Diagnosis not present

## 2022-08-26 DIAGNOSIS — Z125 Encounter for screening for malignant neoplasm of prostate: Secondary | ICD-10-CM | POA: Diagnosis not present

## 2022-08-26 DIAGNOSIS — D529 Folate deficiency anemia, unspecified: Secondary | ICD-10-CM | POA: Diagnosis not present

## 2022-08-26 DIAGNOSIS — E782 Mixed hyperlipidemia: Secondary | ICD-10-CM | POA: Diagnosis not present

## 2022-08-26 DIAGNOSIS — I10 Essential (primary) hypertension: Secondary | ICD-10-CM | POA: Diagnosis not present

## 2022-08-26 DIAGNOSIS — E1165 Type 2 diabetes mellitus with hyperglycemia: Secondary | ICD-10-CM | POA: Diagnosis not present

## 2022-08-26 DIAGNOSIS — N189 Chronic kidney disease, unspecified: Secondary | ICD-10-CM | POA: Diagnosis not present

## 2022-08-29 DIAGNOSIS — D473 Essential (hemorrhagic) thrombocythemia: Secondary | ICD-10-CM | POA: Diagnosis not present

## 2022-08-29 DIAGNOSIS — Z0001 Encounter for general adult medical examination with abnormal findings: Secondary | ICD-10-CM | POA: Diagnosis not present

## 2022-08-29 DIAGNOSIS — I10 Essential (primary) hypertension: Secondary | ICD-10-CM | POA: Diagnosis not present

## 2022-08-29 DIAGNOSIS — F331 Major depressive disorder, recurrent, moderate: Secondary | ICD-10-CM | POA: Diagnosis not present

## 2022-08-29 DIAGNOSIS — Z6835 Body mass index (BMI) 35.0-35.9, adult: Secondary | ICD-10-CM | POA: Diagnosis not present

## 2022-08-29 DIAGNOSIS — E1142 Type 2 diabetes mellitus with diabetic polyneuropathy: Secondary | ICD-10-CM | POA: Diagnosis not present

## 2022-08-29 DIAGNOSIS — G6289 Other specified polyneuropathies: Secondary | ICD-10-CM | POA: Diagnosis not present

## 2022-08-29 DIAGNOSIS — E7849 Other hyperlipidemia: Secondary | ICD-10-CM | POA: Diagnosis not present

## 2022-08-29 DIAGNOSIS — G252 Other specified forms of tremor: Secondary | ICD-10-CM | POA: Diagnosis not present

## 2022-08-29 DIAGNOSIS — R4582 Worries: Secondary | ICD-10-CM | POA: Diagnosis not present

## 2022-08-29 DIAGNOSIS — M109 Gout, unspecified: Secondary | ICD-10-CM | POA: Diagnosis not present

## 2022-09-13 DIAGNOSIS — Z23 Encounter for immunization: Secondary | ICD-10-CM | POA: Diagnosis not present

## 2022-10-10 ENCOUNTER — Encounter (INDEPENDENT_AMBULATORY_CARE_PROVIDER_SITE_OTHER): Payer: Self-pay

## 2022-10-10 ENCOUNTER — Other Ambulatory Visit (INDEPENDENT_AMBULATORY_CARE_PROVIDER_SITE_OTHER): Payer: Self-pay

## 2022-10-10 DIAGNOSIS — I8500 Esophageal varices without bleeding: Secondary | ICD-10-CM

## 2022-10-15 ENCOUNTER — Encounter (HOSPITAL_COMMUNITY): Payer: Self-pay

## 2022-10-15 ENCOUNTER — Encounter (HOSPITAL_COMMUNITY)
Admission: RE | Admit: 2022-10-15 | Discharge: 2022-10-15 | Disposition: A | Discharge status: Home / Self Care | Payer: Medicare Other | Source: Ambulatory Visit | Attending: Gastroenterology | Admitting: Gastroenterology

## 2022-10-15 ENCOUNTER — Other Ambulatory Visit: Payer: Self-pay

## 2022-10-15 NOTE — Pre-Procedure Instructions (Signed)
Attempted pre-op phone call. Left VM for him to call us back.

## 2022-10-17 ENCOUNTER — Ambulatory Visit (HOSPITAL_BASED_OUTPATIENT_CLINIC_OR_DEPARTMENT_OTHER): Payer: Medicare Other | Admitting: Anesthesiology

## 2022-10-17 ENCOUNTER — Encounter (HOSPITAL_COMMUNITY)
Admission: RE | Disposition: A | Discharge status: Home / Self Care | Payer: Self-pay | Source: Home / Self Care | Attending: Gastroenterology

## 2022-10-17 ENCOUNTER — Encounter (HOSPITAL_COMMUNITY): Payer: Self-pay | Admitting: Gastroenterology

## 2022-10-17 ENCOUNTER — Ambulatory Visit (HOSPITAL_COMMUNITY)
Admission: RE | Admit: 2022-10-17 | Discharge: 2022-10-17 | Disposition: A | Discharge status: Home / Self Care | Payer: Medicare Other | Attending: Gastroenterology | Admitting: Gastroenterology

## 2022-10-17 ENCOUNTER — Ambulatory Visit (HOSPITAL_COMMUNITY): Payer: Medicare Other | Admitting: Anesthesiology

## 2022-10-17 DIAGNOSIS — E119 Type 2 diabetes mellitus without complications: Secondary | ICD-10-CM | POA: Insufficient documentation

## 2022-10-17 DIAGNOSIS — Z87891 Personal history of nicotine dependence: Secondary | ICD-10-CM | POA: Insufficient documentation

## 2022-10-17 DIAGNOSIS — K219 Gastro-esophageal reflux disease without esophagitis: Secondary | ICD-10-CM

## 2022-10-17 DIAGNOSIS — Z6834 Body mass index (BMI) 34.0-34.9, adult: Secondary | ICD-10-CM | POA: Insufficient documentation

## 2022-10-17 DIAGNOSIS — I8500 Esophageal varices without bleeding: Secondary | ICD-10-CM | POA: Diagnosis not present

## 2022-10-17 DIAGNOSIS — K449 Diaphragmatic hernia without obstruction or gangrene: Secondary | ICD-10-CM

## 2022-10-17 DIAGNOSIS — K2289 Other specified disease of esophagus: Secondary | ICD-10-CM | POA: Diagnosis not present

## 2022-10-17 DIAGNOSIS — E669 Obesity, unspecified: Secondary | ICD-10-CM | POA: Insufficient documentation

## 2022-10-17 DIAGNOSIS — I10 Essential (primary) hypertension: Secondary | ICD-10-CM | POA: Diagnosis not present

## 2022-10-17 HISTORY — PX: ESOPHAGOGASTRODUODENOSCOPY (EGD) WITH PROPOFOL: SHX5813

## 2022-10-17 LAB — GLUCOSE, CAPILLARY: Glucose-Capillary: 179 mg/dL — ABNORMAL HIGH (ref 70–99)

## 2022-10-17 SURGERY — ESOPHAGOGASTRODUODENOSCOPY (EGD) WITH PROPOFOL
Anesthesia: General

## 2022-10-17 MED ORDER — LACTATED RINGERS IV SOLN: INTRAVENOUS | Status: DC | PRN

## 2022-10-17 MED ORDER — PROPOFOL 10 MG/ML IV BOLUS
INTRAVENOUS | Status: DC | PRN
  Administered 2022-10-17: 30 mg via INTRAVENOUS
  Administered 2022-10-17: 50 mg via INTRAVENOUS
  Administered 2022-10-17: 100 mg via INTRAVENOUS

## 2022-10-17 MED ORDER — OMEPRAZOLE 40 MG PO CPDR: 40.0000 mg | DELAYED_RELEASE_CAPSULE | Freq: Every day | ORAL | 3 refills | Status: AC

## 2022-10-17 NOTE — Op Note (Signed)
[facility] Patient Name: Justin Berger Procedure Date: 10/17/2022 8:57 AM MRN: [id number] Date of Birth: [birth year] Attending MD: Maylon Peppers ,  CSN: [id number] Age: 71 Admit Type: Outpatient Procedure:                Upper GI endoscopy Indications:              Follow-up of esophageal varices Providers:                Maylon Peppers, Crystal Page, Everardo Pacific Referring MD:              Medicines:                Monitored Anesthesia Care Complications:            No immediate complications. Estimated Blood Loss:     Estimated blood loss: none. Procedure:                Pre-Anesthesia Assessment:                           - Prior to the procedure, a History and Physical                            was performed, and patient medications, allergies                            and sensitivities were reviewed. The patient's                            tolerance of previous anesthesia was reviewed.                           - The risks and benefits of the procedure and the                            sedation options and risks were discussed with the                            patient. All questions were answered and informed                            consent was obtained.                           - ASA Grade Assessment: II - A patient with mild                            systemic disease.                           After obtaining informed consent, the endoscope was                            passed under direct vision. Throughout the                            procedure, the patient's blood pressure,  pulse, and                            oxygen saturations were monitored continuously. The                            GIF-H190 ([id number]) scope was introduced through the                            mouth, and advanced to the second part of duodenum.                            The upper GI endoscopy was accomplished without                            difficulty. The patient  tolerated the procedure                            well. Scope In: 9:12:35 AM Scope Out: 9:19:03 AM Total Procedure Duration: 0 hours 6 minutes 28 seconds  Findings:      Scattered "pockets" of grade II varices were found in the entire       esophagus, predominantly in the mid esophagus. Particularly, the venous       confluences did not seem to connect as in columns but as in pockets and       they were not extending from the GE junction.      A single 5 mm submucosal nodule was found in the middle third of the       esophagus - granulosa cell tumor?      The stomach was normal.      The examined duodenum was normal.      Note: no intervention performed as it is unclear if the venous       abnormalities are congenital and not esophageal varices. Impression:               - Grade II esophageal venous protrusions.                           - Submucosal nodule found in the esophagus.                           - Normal stomach.                           - Normal examined duodenum.                           - No specimens collected. Moderate Sedation:      Per Anesthesia Care Recommendation:           - Discharge patient to home (ambulatory).                           - Resume previous diet.                           - Follow up at [facility] with Dr. Bjorn Loser Procedure  Code(s):        --- Professional ---                           [phone], Esophagogastroduodenoscopy, flexible,                            transoral; diagnostic, including collection of                            specimen(s) by brushing or washing, when performed                            (separate procedure) Diagnosis Code(s):        --- Professional ---                           I85.00, Esophageal varices without bleeding                           K22.8, Other specified diseases of esophagus CPT copyright 2019 American Medical Association. All rights reserved. The codes documented in this report are preliminary and  upon coder review may  be revised to meet current compliance requirements. Maylon Peppers, MD Maylon Peppers,  10/17/2022 9:34:37 AM This report has been signed electronically. Number of Addenda: 0

## 2022-10-17 NOTE — Anesthesia Preprocedure Evaluation (Signed)
Anesthesia Evaluation  Patient identified by MRN, date of birth, ID band Patient awake    Reviewed: Allergy & Precautions, NPO status , Patient's Chart, lab work & pertinent test results  Airway Mallampati: II  TM Distance: >3 FB Neck ROM: Full    Dental  (+) Upper Dentures,    Pulmonary neg pulmonary ROS, former smoker,    Pulmonary exam normal        Cardiovascular hypertension, negative cardio ROS Normal cardiovascular exam     Neuro/Psych negative neurological ROS     GI/Hepatic Neg liver ROS, GERD  ,  Endo/Other  negative endocrine ROSdiabetes  Renal/GU negative Renal ROS     Musculoskeletal negative musculoskeletal ROS (+)   Abdominal (+) + obese,   Peds  Hematology  (+) Blood dyscrasia, anemia ,   Anesthesia Other Findings   Reproductive/Obstetrics                             Anesthesia Physical  Anesthesia Plan  ASA: 2  Anesthesia Plan: General   Post-op Pain Management:    Induction: Intravenous  PONV Risk Score and Plan: 1 and Propofol infusion  Airway Management Planned: Nasal Cannula  Additional Equipment:   Intra-op Plan:   Post-operative Plan:   Informed Consent: I have reviewed the patients History and Physical, chart, labs and discussed the procedure including the risks, benefits and alternatives for the proposed anesthesia with the patient or authorized representative who has indicated his/her understanding and acceptance.     Dental advisory given  Plan Discussed with: CRNA  Anesthesia Plan Comments:         Anesthesia Quick Evaluation

## 2022-10-17 NOTE — Discharge Instructions (Addendum)
You are being discharged to home.  Resume your previous diet.  Follow up at [facility] with Dr. Bjorn Loser   Per Dr. Jenetta Downer patient may take Naproxen in limited amounts for his knee per patient recommended by another MD

## 2022-10-17 NOTE — Transfer of Care (Signed)
Immediate Anesthesia Transfer of Care Note  Patient: Justin Berger  Procedure(s) Performed: ESOPHAGOGASTRODUODENOSCOPY (EGD) WITH PROPOFOL  Patient Location: Short Stay  Anesthesia Type:General  Level of Consciousness: awake  Airway & Oxygen Therapy: Patient Spontanous Breathing  Post-op Assessment: Report given to RN  Post vital signs: Reviewed and stable  Last Vitals:  Vitals Value Taken Time  BP 91/60 10/17/22 0923  Temp 36.4 C 10/17/22 0923  Pulse 98 10/17/22 0923  Resp 22 10/17/22 0923  SpO2 95 % 10/17/22 0923    Last Pain:  Vitals:   10/17/22 0923  TempSrc: Axillary  PainSc: 0-No pain         Complications: No notable events documented.

## 2022-10-17 NOTE — H&P (Signed)
Justin Berger is an 71 y.o. male.   Chief Complaint: esophageal varices HPI: Justin Berger is a 71 y.o. male with PMH AAA, DM, colon cancer s/p, essential thrombocytosis, HTN, who presents for follow up of esophageal varices.  The patient denies having any nausea, vomiting, fever, chills, hematochezia, melena, hematemesis, abdominal distention, abdominal pain, diarrhea, jaundice, pruritus or weight loss.  Patient had an appointment with [facility] for further evaluation of etiology of varices but had he accidentally canceled his appointment and was rescheduled to see them on January 2024.   Past Medical History:  Diagnosis Date   AAA (abdominal aortic aneurysm) without rupture ([place]) 01/2021   Colon cancer ([place]) 2003   Diabetes ([place])    Essential thrombocytosis ([place])    Hypertension    Skin cancer, basal cell    back    Past Surgical History:  Procedure Laterality Date   BIOPSY  12/18/2021   Procedure: BIOPSY;  Surgeon: Harvel Quale, MD;  [facility];  Service: Gastroenterology;;   CATARACT EXTRACTION, BILATERAL     COLON SURGERY  06/23/2002   right colectomy   COLONOSCOPY  08/25/2020   Dr. Adelina Mings, normal colon other than venous lake in Left colon   COLONOSCOPY WITH PROPOFOL N/A 10/23/2021   Procedure: COLONOSCOPY WITH PROPOFOL;  Surgeon: Harvel Quale, MD;  [facility];  Service: Gastroenterology;  Laterality: N/A;  12:30   ESOPHAGEAL BANDING  10/23/2021   Procedure: ESOPHAGEAL BANDING;  Surgeon: Harvel Quale, MD;  [facility];  Service: Gastroenterology;;   ESOPHAGEAL BANDING  12/18/2021   Procedure: ESOPHAGEAL BANDING;  Surgeon: Montez Morita, Quillian Quince, MD;  [facility];  Service: Gastroenterology;;   ESOPHAGEAL BANDING  02/08/2022   Procedure: ESOPHAGEAL BANDING;  Surgeon: Montez Morita, Quillian Quince, MD;  [facility];  Service: Gastroenterology;;    ESOPHAGOGASTRODUODENOSCOPY  05/25/2021   [facility]: moderate inflammation characterized by adherent blood, erythema, and friability in gastric body, antrum and prepyloric region, as well as 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   ESOPHAGOGASTRODUODENOSCOPY (EGD) WITH PROPOFOL N/A 10/23/2021   Procedure: ESOPHAGOGASTRODUODENOSCOPY (EGD) WITH PROPOFOL;  Surgeon: Harvel Quale, MD;  [facility];  Service: Gastroenterology;  Laterality: N/A;   ESOPHAGOGASTRODUODENOSCOPY (EGD) WITH PROPOFOL N/A 12/18/2021   Procedure: ESOPHAGOGASTRODUODENOSCOPY (EGD) WITH PROPOFOL;  Surgeon: Harvel Quale, MD;  [facility];  Service: Gastroenterology;  Laterality: N/A;  8:05   ESOPHAGOGASTRODUODENOSCOPY (EGD) WITH PROPOFOL N/A 02/08/2022   Procedure: ESOPHAGOGASTRODUODENOSCOPY (EGD) WITH PROPOFOL;  Surgeon: Harvel Quale, MD;  [facility];  Service: Gastroenterology;  Laterality: N/A;  1055   ESOPHAGOGASTRODUODENOSCOPY (EGD) WITH PROPOFOL N/A 07/09/2022   Procedure: ESOPHAGOGASTRODUODENOSCOPY (EGD) WITH PROPOFOL;  Surgeon: Harvel Quale, MD;  [facility];  Service: Gastroenterology;  Laterality: N/A;  1130 ASA 2   HEMOSTASIS CLIP PLACEMENT  02/08/2022   Procedure: HEMOSTASIS CLIP PLACEMENT;  Surgeon: Harvel Quale, MD;  [facility];  Service: Gastroenterology;;   IR TRANSCATHETER BX  12/03/2021   IR US GUIDE VASC ACCESS RIGHT  12/03/2021   POLYPECTOMY  10/23/2021   Procedure: POLYPECTOMY INTESTINAL;  Surgeon: Harvel Quale, MD;  [facility];  Service: Gastroenterology;;   POLYPECTOMY  02/08/2022   Procedure: POLYPECTOMY;  Surgeon: Harvel Quale, MD;  [facility];  Service: Gastroenterology;;   TONSILLECTOMY      Family History  Problem Relation Age of Onset   Dementia Mother    Cancer Paternal Uncle        lung   Social  History:  reports that he quit smoking about 44 years ago. His smoking use included cigarettes. He has a 1.50 pack-year smoking history. He has never used smokeless tobacco. He reports that he does not drink alcohol and does not use drugs.  Allergies:  Allergies  Allergen Reactions   Ivp Dye  [Iodinated Contrast Media]     Heart stopped     Medications Prior to Admission  Medication Sig Dispense Refill   acetaminophen (TYLENOL) 500 MG tablet Take 1,000 mg by mouth every 6 (six) hours as needed for moderate pain or headache.     allopurinol (ZYLOPRIM) 300 MG tablet Take 300 mg by mouth in the morning.     Ascorbic Acid (VITAMIN C PO) Take 2,000 mg by mouth daily as needed (during winter season).     aspirin 81 MG EC tablet Take 81 mg by mouth in the morning.     atorvastatin (LIPITOR) 10 MG tablet Take 10 mg by mouth every Sunday.     Cholecalciferol (VITAMIN D3 PO) Take 2 capsules by mouth in the morning.     clonazePAM (KLONOPIN) 0.5 MG tablet Take 0.5 mg by mouth daily as needed for anxiety.     DULoxetine (CYMBALTA) 60 MG capsule Take 60 mg by mouth in the morning.     fluticasone (FLONASE) 50 MCG/ACT nasal spray Place 1 spray into both nostrils daily as needed for allergies or rhinitis.     gabapentin (NEURONTIN) 300 MG capsule Take 900 mg by mouth at bedtime.     glipiZIDE (GLUCOTROL XL) 10 MG 24 hr tablet Take 10 mg by mouth 2 (two) times daily.     hydroxyurea (HYDREA) 500 MG capsule Take 2 capsules (1,000 mg total) by mouth 3 (three) times a week AND 3 capsules (1,500 mg total) 4 (four) times a week. [2 capsules on Tu/Th/Sun, 3 capsules on M/W/F/Sat]. May take with food to minimize GI side effects.. 204 capsule 3   loratadine (CLARITIN) 10 MG tablet Take 10 mg by mouth daily as needed for allergies.     metFORMIN (GLUCOPHAGE-XR) 500 MG 24 hr tablet Take 1,000 mg by mouth in the morning and at bedtime.  6   metoprolol succinate (TOPROL-XL) 50 MG 24 hr tablet Take 50 mg by mouth every  evening.     Multiple Vitamin (MULTIVITAMIN WITH MINERALS) TABS tablet Take 1 tablet by mouth in the morning.     omeprazole (PRILOSEC) 40 MG capsule Take 1 capsule (40 mg total) by mouth 2 (two) times daily. 180 capsule 0   sucralfate (CARAFATE) 1 g tablet Take 1 tablet (1 g total) by mouth 2 (two) times daily. 28 tablet 0   tadalafil (CIALIS) 20 MG tablet Take 20 mg by mouth daily as needed for erectile dysfunction.     zinc gluconate 50 MG tablet Take 50 mg by mouth daily as needed (during the winter season).     ACCU-CHEK GUIDE test strip SMARTSIG:Strip(s) Via Meter Daily     Accu-Chek Softclix Lancets lancets USE TO TEST BLOOD SUGAR DAILY     Blood Glucose Monitoring Suppl (ACCU-CHEK GUIDE ME) w/Device KIT USE TO TEST FASTING SUGAR ONCE DAILY      Results for orders placed or performed during the hospital encounter of 10/17/22 (from the past 48 hour(s))  Glucose, capillary     Status:  Abnormal   Collection Time: 10/17/22  8:22 AM  Result Value Ref Range   Glucose-Capillary 179 (H) 70 - 99 mg/dL    Comment: Glucose reference range applies only to samples taken after fasting for at least 8 hours.   No results found.  Review of Systems  All other systems reviewed and are negative.   Blood pressure 131/87, pulse 91, temperature 98 F (36.7 C), temperature source Oral, resp. rate 18, height 5' 10" (1.778 m), weight 109.1 kg, SpO2 96 %. Physical Exam  GENERAL: The patient is AO x3, in no acute distress. HEENT: Head is normocephalic and atraumatic. EOMI are intact. Mouth is well hydrated and without lesions. NECK: Supple. No masses LUNGS: Clear to auscultation. No presence of rhonchi/wheezing/rales. Adequate chest expansion HEART: RRR, normal s1 and s2. ABDOMEN: Soft, nontender, no guarding, no peritoneal signs, and nondistended. BS +. No masses. EXTREMITIES: Without any cyanosis, clubbing, rash, lesions or edema. NEUROLOGIC: AOx3, no focal motor deficit. SKIN: no jaundice, no  rashes  Assessment/Plan Justin Berger is a 71 y.o. male with PMH AAA, DM, colon cancer s/p, essential thrombocytosis, HTN, who presents for follow up of esophageal varices. We will proceed with EGD  Harvel Quale, MD 10/17/2022, 8:42 AM

## 2022-10-17 NOTE — Anesthesia Postprocedure Evaluation (Signed)
Anesthesia Post Note  Patient: Justin Berger  Procedure(s) Performed: ESOPHAGOGASTRODUODENOSCOPY (EGD) WITH PROPOFOL  Patient location during evaluation: Short Stay Anesthesia Type: General Level of consciousness: awake and alert Pain management: pain level controlled Vital Signs Assessment: post-procedure vital signs reviewed and stable Respiratory status: spontaneous breathing Cardiovascular status: blood pressure returned to baseline and stable Postop Assessment: no apparent nausea or vomiting Anesthetic complications: no   No notable events documented.   Last Vitals:  Vitals:   10/17/22 0803 10/17/22 0923  BP: 131/87 91/60  Pulse: 91 98  Resp: 18 (!) 22  Temp: 36.7 C (!) 36.4 C  SpO2: 96% 95%    Last Pain:  Vitals:   10/17/22 0923  TempSrc: Axillary  PainSc: 0-No pain                 Nussen Pullin

## 2022-10-22 ENCOUNTER — Encounter (HOSPITAL_COMMUNITY): Payer: Self-pay | Admitting: Gastroenterology

## 2022-10-23 DIAGNOSIS — H34832 Tributary (branch) retinal vein occlusion, left eye, with macular edema: Secondary | ICD-10-CM | POA: Diagnosis not present

## 2022-10-23 DIAGNOSIS — H40013 Open angle with borderline findings, low risk, bilateral: Secondary | ICD-10-CM | POA: Diagnosis not present

## 2022-10-23 DIAGNOSIS — H26493 Other secondary cataract, bilateral: Secondary | ICD-10-CM | POA: Diagnosis not present

## 2022-10-23 DIAGNOSIS — E119 Type 2 diabetes mellitus without complications: Secondary | ICD-10-CM | POA: Diagnosis not present

## 2022-11-30 NOTE — Progress Notes (Unsigned)
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 male returns for routine follow-up of his CALR essential thrombocytosis and iron deficiency anemia.  Justin Berger was last seen by Tarri Abernethy PA-C on 08/22/2022.  At today's visit, Justin Berger reports feeling ***.   *** Justin Berger continues to have intermittent bilateral leg weakness and chronic fatigue.  *** Justin Berger denies any other hospital stays, procedures, or changes in his baseline health status.  ***Recent EGD 10/17/2022 showed esophageal venous protrusions (grade 2), which per Dr. Jenetta Downer no intervention was performed as it is unclear if the venous abnormalities are congenital and not true esophageal varices; patient was referred for further evaluation at [facility].***  Justin Berger is tolerating his Hydrea well.  *** Justin Berger has some chronic diarrhea, but denies any other GI symptoms.  No cutaneous ulcers, nonhealing wounds, or mouth sores.***  Justin Berger has not noticed any recent melena, hematochezia, or epistaxis.  *** *** Justin Berger does continue to have some moderate fatigue and intermittent lightheadedness. *** Justin Berger has some restless legs.    *** Justin Berger denies any pica, headaches, chest pain, dyspnea on exertion, or syncope.   No current signs or symptoms of DVT or PE.  *** *** Justin Berger denies any aquagenic pruritus or erythromelalgia, but does report that Justin Berger has intermittent bluish discoloration of his palms especially after showering, which is nonpainful. *** Chronic neuropathy at baseline.  Justin Berger has some occasional dizziness.  Justin Berger has intermittent blurry vision. *** No other vasomotor symptoms such as  tinnitus or strokelike symptoms.  No B symptoms such as fever, chills, night sweats, unintentional weight loss.  Justin Berger denies any abdominal pain, nausea, early satiety.  *** B12 500 mcg daily (instructions via MyChart message after last appointment) *** Folic acid 035 mcg daily   Justin Berger has ***% energy and ***% appetite. Justin Berger endorses that Justin Berger is maintaining a stable weight.   REVIEW OF SYSTEMS:*** Review of Systems  Constitutional:  Positive for fatigue. Negative for appetite change, chills, diaphoresis, fever and unexpected weight change.  HENT:   Negative for lump/mass and nosebleeds.   Eyes:  Negative for eye problems.  Respiratory:  Negative for cough, hemoptysis and shortness of breath.   Cardiovascular:  Negative for chest pain, leg swelling and palpitations.  Gastrointestinal:  Positive for diarrhea. Negative for abdominal pain, blood in stool, constipation, nausea and vomiting.  Genitourinary:  Negative for hematuria.   Musculoskeletal:  Positive for gait problem.  Skin: Negative.   Neurological:  Positive for dizziness and gait problem. Negative for headaches and light-headedness.  Hematological:  Does not bruise/bleed easily.      PAST MEDICAL/SURGICAL HISTORY:  Past Medical History:  Diagnosis Date   AAA (abdominal aortic aneurysm) without rupture ([place]) 01/2021   Colon cancer ([place]) 2003   Diabetes ([place])    Essential thrombocytosis ([place])    Hypertension    Skin cancer, basal cell    back   Past Surgical History:  Procedure Laterality Date   BIOPSY  12/18/2021   Procedure: BIOPSY;  Surgeon: Harvel Quale, MD;  [facility];  Service: Gastroenterology;;   CATARACT EXTRACTION, BILATERAL  COLON SURGERY  06/23/2002   right colectomy   COLONOSCOPY  08/25/2020   Dr. Adelina Mings, normal colon other than venous lake in Left colon   COLONOSCOPY WITH PROPOFOL N/A 10/23/2021   Procedure: COLONOSCOPY WITH PROPOFOL;  Surgeon: Harvel Quale,  MD;  [facility];  Service: Gastroenterology;  Laterality: N/A;  12:30   ESOPHAGEAL BANDING  10/23/2021   Procedure: ESOPHAGEAL BANDING;  Surgeon: Harvel Quale, MD;  [facility];  Service: Gastroenterology;;   ESOPHAGEAL BANDING  12/18/2021   Procedure: ESOPHAGEAL BANDING;  Surgeon: Montez Morita, Quillian Quince, MD;  [facility];  Service: Gastroenterology;;   ESOPHAGEAL BANDING  02/08/2022   Procedure: ESOPHAGEAL BANDING;  Surgeon: Montez Morita, Quillian Quince, MD;  [facility];  Service: Gastroenterology;;   ESOPHAGOGASTRODUODENOSCOPY  05/25/2021   [facility]: moderate inflammation characterized by adherent blood, erythema, and friability in gastric body, antrum and prepyloric region, as well as 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   ESOPHAGOGASTRODUODENOSCOPY (EGD) WITH PROPOFOL N/A 10/23/2021   Procedure: ESOPHAGOGASTRODUODENOSCOPY (EGD) WITH PROPOFOL;  Surgeon: Harvel Quale, MD;  [facility];  Service: Gastroenterology;  Laterality: N/A;   ESOPHAGOGASTRODUODENOSCOPY (EGD) WITH PROPOFOL N/A 12/18/2021   Procedure: ESOPHAGOGASTRODUODENOSCOPY (EGD) WITH PROPOFOL;  Surgeon: Harvel Quale, MD;  [facility];  Service: Gastroenterology;  Laterality: N/A;  8:05   ESOPHAGOGASTRODUODENOSCOPY (EGD) WITH PROPOFOL N/A 02/08/2022   Procedure: ESOPHAGOGASTRODUODENOSCOPY (EGD) WITH PROPOFOL;  Surgeon: Harvel Quale, MD;  [facility];  Service: Gastroenterology;  Laterality: N/A;  1055   ESOPHAGOGASTRODUODENOSCOPY (EGD) WITH PROPOFOL N/A 07/09/2022   Procedure: ESOPHAGOGASTRODUODENOSCOPY (EGD) WITH PROPOFOL;  Surgeon: Harvel Quale, MD;  [facility];  Service: Gastroenterology;  Laterality: N/A;  1130 ASA 2   ESOPHAGOGASTRODUODENOSCOPY (EGD) WITH PROPOFOL N/A 10/17/2022   Procedure: ESOPHAGOGASTRODUODENOSCOPY (EGD) WITH PROPOFOL;   Surgeon: Harvel Quale, MD;  [facility];  Service: Gastroenterology;  Laterality: N/A;  915 ASA 2   HEMOSTASIS CLIP PLACEMENT  02/08/2022   Procedure: HEMOSTASIS CLIP PLACEMENT;  Surgeon: Harvel Quale, MD;  [facility];  Service: Gastroenterology;;   IR TRANSCATHETER BX  12/03/2021   IR US GUIDE VASC ACCESS RIGHT  12/03/2021   POLYPECTOMY  10/23/2021   Procedure: POLYPECTOMY INTESTINAL;  Surgeon: Harvel Quale, MD;  [facility];  Service: Gastroenterology;;   POLYPECTOMY  02/08/2022   Procedure: POLYPECTOMY;  Surgeon: [place] Morita, Quillian Quince, MD;  [facility];  Service: Gastroenterology;;   TONSILLECTOMY       SOCIAL HISTORY:  Social History   Socioeconomic History   Marital status: Married    Spouse name: Not on file   Number of children: Not on file   Years of education: Not on file   Highest education level: Not on file  Occupational History   Not on file  Tobacco Use   Smoking status: Former    Packs/day: 0.50    Years: 3.00    Total pack years: 1.50    Types: Cigarettes    Quit date: 04/08/1978    Years since quitting: 44.6   Smokeless tobacco: Never  Vaping Use   Vaping Use: Never used  Substance and Sexual Activity   Alcohol use: Never   Drug use: Never   Sexual activity: Not on file  Other Topics Concern   Not on file  Social History Narrative   Not on file  Social Determinants of Health   Financial Resource Strain: Low Risk  (11/29/2020)   Overall Financial Resource Strain (CARDIA)    Difficulty of Paying Living Expenses: Not hard at all  Food Insecurity: No Food Insecurity (11/29/2020)   Hunger Vital Sign    Worried About Running Out of Food in the Last Year: Never true    Ran Out of Food in the Last Year: Never true  Transportation Needs: No Transportation Needs (11/29/2020)   PRAPARE - Hydrologist (Medical): No    Lack of Transportation  (Non-Medical): No  Physical Activity: Inactive (11/29/2020)   Exercise Vital Sign    Days of Exercise per Week: 0 days    Minutes of Exercise per Session: 0 min  Stress: No Stress Concern Present (11/29/2020)   [place]    Feeling of Stress : Not at all  Social Connections: Moderately Integrated (11/29/2020)   Social Connection and Isolation Panel [NHANES]    Frequency of Communication with Friends and Family: More than three times a week    Frequency of Social Gatherings with Friends and Family: Three times a week    Attends Religious Services: 1 to 4 times per year    Active Member of Clubs or Organizations: No    Attends Archivist Meetings: Never    Marital Status: Married  Human resources officer Violence: Not At Risk (11/29/2020)   Humiliation, Afraid, Rape, and Kick questionnaire    Fear of Current or Ex-Partner: No    Emotionally Abused: No    Physically Abused: No    Sexually Abused: No    FAMILY HISTORY:  Family History  Problem Relation Age of Onset   Dementia Mother    Cancer Paternal Uncle        lung    CURRENT MEDICATIONS:  Outpatient Encounter Medications as of 12/02/2022  Medication Sig Note   ACCU-CHEK GUIDE test strip SMARTSIG:Strip(s) Via Meter Daily    Accu-Chek Softclix Lancets lancets USE TO TEST BLOOD SUGAR DAILY    acetaminophen (TYLENOL) 500 MG tablet Take 1,000 mg by mouth every 6 (six) hours as needed for moderate pain or headache.    allopurinol (ZYLOPRIM) 300 MG tablet Take 300 mg by mouth in the morning.    Ascorbic Acid (VITAMIN C PO) Take 2,000 mg by mouth daily as needed (during winter season).    aspirin 81 MG EC tablet Take 81 mg by mouth in the morning.    atorvastatin (LIPITOR) 10 MG tablet Take 10 mg by mouth every Sunday.    Blood Glucose Monitoring Suppl (ACCU-CHEK GUIDE ME) w/Device KIT USE TO TEST FASTING SUGAR ONCE DAILY    Cholecalciferol (VITAMIN D3 PO) Take 2  capsules by mouth in the morning.    clonazePAM (KLONOPIN) 0.5 MG tablet Take 0.5 mg by mouth daily as needed for anxiety.    DULoxetine (CYMBALTA) 60 MG capsule Take 60 mg by mouth in the morning.    fluticasone (FLONASE) 50 MCG/ACT nasal spray Place 1 spray into both nostrils daily as needed for allergies or rhinitis.    gabapentin (NEURONTIN) 300 MG capsule Take 900 mg by mouth at bedtime.    glipiZIDE (GLUCOTROL XL) 10 MG 24 hr tablet Take 10 mg by mouth 2 (two) times daily.    hydroxyurea (HYDREA) 500 MG capsule Take 2 capsules (1,000 mg total) by mouth 3 (three) times a week AND 3 capsules (1,500 mg total) 4 (four)  times a week. [2 capsules on Tu/Th/Sun, 3 capsules on M/W/F/Sat]. May take with food to minimize GI side effects.[name]    loratadine (CLARITIN) 10 MG tablet Take 10 mg by mouth daily as needed for allergies.    metFORMIN (GLUCOPHAGE-XR) 500 MG 24 hr tablet Take 1,000 mg by mouth in the morning and at bedtime.    metoprolol succinate (TOPROL-XL) 50 MG 24 hr tablet Take 50 mg by mouth every evening. 06/28/2022: Pharmacy records show the most recent fill on 04/04/2022 #90 was a 100 mg but pt and his wife insists that Justin Berger is taking the 50 mg tablet   Multiple Vitamin (MULTIVITAMIN WITH MINERALS) TABS tablet Take 1 tablet by mouth in the morning.    omeprazole (PRILOSEC) 40 MG capsule Take 1 capsule (40 mg total) by mouth daily.    sucralfate (CARAFATE) 1 g tablet Take 1 tablet (1 g total) by mouth 2 (two) times daily.    tadalafil (CIALIS) 20 MG tablet Take 20 mg by mouth daily as needed for erectile dysfunction.    zinc gluconate 50 MG tablet Take 50 mg by mouth daily as needed (during the winter season).    No facility-administered encounter medications on file as of 12/02/2022.    ALLERGIES:  Allergies  Allergen Reactions   Ivp Dye  [Iodinated Contrast Media]     Heart stopped      PHYSICAL EXAM:*** ECOG PERFORMANCE STATUS: 2 - Symptomatic, <50% confined to bed  There were no  vitals filed for this visit. There were no vitals filed for this visit. Physical Exam Constitutional:      Appearance: Normal appearance. Justin Berger is obese.  HENT:     Head: Normocephalic and atraumatic.     Mouth/Throat:     Mouth: Mucous membranes are moist.  Eyes:     Extraocular Movements: Extraocular movements intact.     Pupils: Pupils are equal, round, and reactive to light.  Cardiovascular:     Rate and Rhythm: Normal rate and regular rhythm.     Pulses: Normal pulses.     Heart sounds: Normal heart sounds.  Pulmonary:     Effort: Pulmonary effort is normal.     Breath sounds: Normal breath sounds.  Abdominal:     General: Abdomen is protuberant. Bowel sounds are normal.     Palpations: Abdomen is soft.     Tenderness: There is no abdominal tenderness.  Musculoskeletal:        General: No swelling.     Right lower leg: No edema.     Left lower leg: No edema.  Lymphadenopathy:     Cervical: No cervical adenopathy.  Skin:    General: Skin is warm and dry.  Neurological:     General: No focal deficit present.     Mental Status: Justin Berger is alert and oriented to person, place, and time.  Psychiatric:        Mood and Affect: Mood normal.        Behavior: Behavior normal.     LABORATORY DATA:  I have reviewed the labs as listed.  CBC    Component Value Date/Time   WBC 4.7 08/22/2022 0953   RBC 3.37 (L) 08/22/2022 0953   HGB 11.1 (L) 08/22/2022 0953   HCT 35.2 (L) 08/22/2022 0953   PLT 431 (H) 08/22/2022 0953   MCV 104.5 (H) 08/22/2022 0953   MCH 32.9 08/22/2022 0953   MCHC 31.5 08/22/2022 0953   RDW 19.1 (H) 08/22/2022 0953   LYMPHSABS  0.9 08/22/2022 0953   MONOABS 0.5 08/22/2022 0953   EOSABS 0.0 08/22/2022 0953   BASOSABS 0.1 08/22/2022 0953      Latest Ref Rng & Units 08/22/2022    9:53 AM 05/10/2022    9:39 AM 02/12/2022    8:50 AM  CMP  Glucose 70 - 99 mg/dL 161  201  165   BUN 8 - 23 mg/dL _0 Creatinine 0.61 - 1.24 mg/dL 1.03  1.27  1.03   Sodium  135 - 145 mmol/L 138  139  137   Potassium 3.5 - 5.1 mmol/L 4.3  4.3  4.0   Chloride 98 - 111 mmol/L 106  109  104   CO2 22 - 32 mmol/L _1 Calcium 8.9 - 10.3 mg/dL 9.3  9.0  9.2   Total Protein 6.5 - 8.1 g/dL 6.9  6.5  6.7   Total Bilirubin 0.3 - 1.2 mg/dL 1.0  0.5  0.7   Alkaline Phos 38 - 126 U/L 35  46  49   AST 15 - 41 U/L _2 ALT 0 - 44 U/L _3 DIAGNOSTIC IMAGING:  I have independently reviewed the relevant imaging and discussed with the patient.  ASSESSMENT & PLAN: 1.  Essential thrombocytosis, CALR+: - High risk disease with history of thrombosis (left leg DVT) and age > 70 - Bone marrow biopsy 12/10/2017 by Dr. Delton Coombes at [facility] in [place], [place] Flow cytometty for leukemia/lymphoma was non-diagnostic Pathology review showed 60% cellularity with significant megakaryocytic hyperplasia with clusters of megakaryocytes; reticulin stain shows slight (Grade 1) reticulin fibrosis, some centered around areas of megakaryocyte clusters Cytogenic analysis showed normal male karyotype 46,XY[20] Noted to be at risk for progression to post essential thrombocythemia related myelofibrosis, but peripheral blood did not show other features of myelofibrosis, such as leukoerythroblastic blood or atypical megakaryocyte morphology - Intermittent bluish discoloration of bilateral palms, nonpainful.  No aquagenic pruritus or erythromelalgias.  Mild vasomotor symptoms such as intermittent blurry vision; no tinnitus, headache, strokelike symptoms.*** - Hydroxyurea started in December 2018 - current dose 1000 mg TTSun and 1500 mg MWFSat.*** - Justin Berger is on aspirin 81 mg daily. - Justin Berger is tolerating Hydrea well without side effects apart from chronic mild diarrhea which Justin Berger attributes to his metformin*** - CT abdomen and pelvis on 09/29/2017 showed splenomegaly with spleen 14.5 cm.  Abdominal ultrasound (11/02/2021): Stable splenomegaly with spleen 14.3 cm.  MRI abdomen  (11/20/2021): No splenomegaly noted. - No palpable adenopathy or splenomegaly on exam*** - Labs today (12/02/2022): *** - Goal is platelets < 500 - PLAN: We will continue his Hydrea 1000 mg TTSun and 1500 mg MWFSat.*** - Continue aspirin 81 mg daily. - Repeat labs and RTC in 3 months.***   2.  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   --  Took iron pills in the past and did well with them.  Justin Berger has never required IV iron or blood transfusion.   -- EGD (05/25/2021 at [facility]): Moderate inflammation characterized by adherent blood, erythema, and friability in the gastric body, antrum, and prepyloric region, as well as 3 columns of grade 2  varices in the mid esophagus and distal esophagus. -- Justin Berger has had multiple EGDs with banding of varices since May 2022 - most recently on 07/09/2022 (banding of grade II esophageal varices by Dr. Jenetta Downer) - Most recent EGD (10/17/2022): ***  - Laboratory work-up otherwise revealed LDH was slightly elevated at 245.  Erythropoietin was  elevated at 67.2.  Reticulocytes 2.9%, with reticulocyte index < 2 indicating hypoproliferation.   Peripheral smear showed macrocytic anemia, neutrophilic left shift, and  thrombocytosis.  SPEP/IFE were normal.  Hemolysis labs unremarkable. - Differential diagnosis favors multifactorial anemia due to chronic GI blood loss, myelosuppressive effects of hydroxyurea, and anemia related to chronic disease - Most recent labs (12/02/2022): *** - Denies any gross signs or symptoms of blood loss such as hematemesis, hematochezia, or melena    *** - Symptomatic with ongoing chronic fatigue   *** - PLAN: No indication for oral iron supplement or IV iron at this time.***-  Recommend continued follow-up  with gastroenterology for possible GI blood loss from esophageal varices. - Continue current dose of Hydrea as above.  Would consider decrease dose if Hgb less than 10.*** - Repeat CBC and iron studies in 3 months.***  3.  Vitamin B12 and folate deficiencies - Folic acid deficiency noted on 02/12/2022 with folate 5.3, normal homocystine - Borderline vitamin B12 noted on 02/12/2022 with B12 287 and normal MMA - Patient taking vitamin B12 500 mcg daily and folic acid 496 mcg daily *** - Most recent labs *** - PLAN: ***     4. 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 The patient knows to call the clinic with any problems, questions or concerns.  Medical decision making: Moderate   Time spent on visit: I spent 20 minutes counseling the patient face to face. The total time spent in the appointment was 30 minutes and more than 50% was on counseling.   Harriett Rush, PA-C  ***

## 2022-12-02 ENCOUNTER — Inpatient Hospital Stay: Payer: Medicare Other | Attending: Physician Assistant | Admitting: Physician Assistant

## 2022-12-02 ENCOUNTER — Inpatient Hospital Stay: Payer: Medicare Other

## 2022-12-02 ENCOUNTER — Other Ambulatory Visit: Payer: Self-pay

## 2022-12-02 ENCOUNTER — Other Ambulatory Visit (HOSPITAL_COMMUNITY)
Admission: RE | Admit: 2022-12-02 | Discharge: 2022-12-02 | Disposition: A | Discharge status: Home / Self Care | Payer: Medicare Other | Source: Ambulatory Visit | Attending: Physician Assistant | Admitting: Physician Assistant

## 2022-12-02 VITALS — BP 152/90 | HR 71 | Temp 97.8°F | Resp 18 | Ht 70.0 in | Wt 246.8 lb

## 2022-12-02 DIAGNOSIS — D509 Iron deficiency anemia, unspecified: Secondary | ICD-10-CM | POA: Insufficient documentation

## 2022-12-02 DIAGNOSIS — D500 Iron deficiency anemia secondary to blood loss (chronic): Secondary | ICD-10-CM

## 2022-12-02 DIAGNOSIS — D473 Essential (hemorrhagic) thrombocythemia: Secondary | ICD-10-CM | POA: Insufficient documentation

## 2022-12-02 DIAGNOSIS — Z85038 Personal history of other malignant neoplasm of large intestine: Secondary | ICD-10-CM | POA: Diagnosis not present

## 2022-12-02 DIAGNOSIS — R5382 Chronic fatigue, unspecified: Secondary | ICD-10-CM | POA: Diagnosis not present

## 2022-12-02 DIAGNOSIS — R531 Weakness: Secondary | ICD-10-CM | POA: Insufficient documentation

## 2022-12-02 DIAGNOSIS — Z85828 Personal history of other malignant neoplasm of skin: Secondary | ICD-10-CM | POA: Insufficient documentation

## 2022-12-02 DIAGNOSIS — D539 Nutritional anemia, unspecified: Secondary | ICD-10-CM

## 2022-12-02 DIAGNOSIS — Z87891 Personal history of nicotine dependence: Secondary | ICD-10-CM | POA: Diagnosis not present

## 2022-12-02 DIAGNOSIS — M6281 Muscle weakness (generalized): Secondary | ICD-10-CM | POA: Insufficient documentation

## 2022-12-02 DIAGNOSIS — Z79899 Other long term (current) drug therapy: Secondary | ICD-10-CM | POA: Insufficient documentation

## 2022-12-02 DIAGNOSIS — E538 Deficiency of other specified B group vitamins: Secondary | ICD-10-CM

## 2022-12-02 LAB — CBC WITH DIFFERENTIAL/PLATELET
Abs Immature Granulocytes: 0.2 10*3/uL — ABNORMAL HIGH (ref 0.00–0.07)
Basophils Absolute: 0.1 10*3/uL (ref 0.0–0.1)
Basophils Relative: 1 %
Eosinophils Absolute: 0 10*3/uL (ref 0.0–0.5)
Eosinophils Relative: 1 %
HCT: 34.6 % — ABNORMAL LOW (ref 39.0–52.0)
Hemoglobin: 10.8 g/dL — ABNORMAL LOW (ref 13.0–17.0)
Immature Granulocytes: 5 %
Lymphocytes Relative: 18 %
Lymphs Abs: 0.8 10*3/uL (ref 0.7–4.0)
MCH: 32.2 pg (ref 26.0–34.0)
MCHC: 31.2 g/dL (ref 30.0–36.0)
MCV: 103.3 fL — ABNORMAL HIGH (ref 80.0–100.0)
Monocytes Absolute: 0.5 10*3/uL (ref 0.1–1.0)
Monocytes Relative: 11 %
Neutro Abs: 2.7 10*3/uL (ref 1.7–7.7)
Neutrophils Relative %: 64 %
Platelets: 380 10*3/uL (ref 150–400)
RBC: 3.35 MIL/uL — ABNORMAL LOW (ref 4.22–5.81)
RDW: 18.5 % — ABNORMAL HIGH (ref 11.5–15.5)
WBC: 4.2 10*3/uL (ref 4.0–10.5)
nRBC: 0.7 % — ABNORMAL HIGH (ref 0.0–0.2)

## 2022-12-02 LAB — COMPREHENSIVE METABOLIC PANEL
ALT: 17 U/L (ref 0–44)
AST: 21 U/L (ref 15–41)
Albumin: 4.2 g/dL (ref 3.5–5.0)
Alkaline Phosphatase: 35 U/L — ABNORMAL LOW (ref 38–126)
Anion gap: 9 (ref 5–15)
BUN: 12 mg/dL (ref 8–23)
CO2: 24 mmol/L (ref 22–32)
Calcium: 9 mg/dL (ref 8.9–10.3)
Chloride: 106 mmol/L (ref 98–111)
Creatinine, Ser: 1.07 mg/dL (ref 0.61–1.24)
GFR, Estimated: >60 mL/min (ref >60)
Glucose, Bld: 189 mg/dL — ABNORMAL HIGH (ref 70–99)
Potassium: 3.9 mmol/L (ref 3.5–5.1)
Sodium: 139 mmol/L (ref 135–145)
Total Bilirubin: 0.7 mg/dL (ref 0.3–1.2)
Total Protein: 6.7 g/dL (ref 6.5–8.1)

## 2022-12-02 LAB — LACTATE DEHYDROGENASE: LDH: 189 U/L (ref 98–192)

## 2022-12-02 LAB — VITAMIN B12: Vitamin B-12: 836 pg/mL (ref 180–914)

## 2022-12-02 LAB — IRON AND TIBC
Iron: 102 ug/dL (ref 45–182)
Saturation Ratios: 26 % (ref 17.9–39.5)
TIBC: 399 ug/dL (ref 250–450)
UIBC: 297 ug/dL

## 2022-12-02 LAB — FOLATE: Folate: 17.4 ng/mL (ref >5.9)

## 2022-12-02 LAB — FERRITIN: Ferritin: 135 ng/mL (ref 24–336)

## 2022-12-02 NOTE — Patient Instructions (Signed)
[place] at [facility] Discharge Instructions  You were seen today by Tarri Abernethy PA-C for your elevated platelets (essential thrombocytosis) and your anemia.  Continue your current dose of Hydrea.   Take 3 tablets (1500 mg) on Monday, Wednesday, Friday, Saturday Take 2 tablets (1000 mg) on Tuesday, Thursday, Sunday  Continue to take your aspirin 81 mg daily.  You continue to have mild anemia, which may be related to your Hydrea or related to possible blood loss from esophageal varices.  We will continue to monitor your blood counts, but for the time being they are good enough to continue your Hydrea.  Continue follow-up with GI doctors regarding esophageal varices.  Your iron labs are not back yet.  I will send you a MyChart message once I have those results.  LABS: Return in 3 months for repeat labs  FOLLOW-UP APPOINTMENT: Office visit in 3 months  ** Thank you for trusting me with your healthcare!  I strive to provide all of my patients with quality care at each visit.  If you receive a survey for this visit, I would be so grateful to you for taking the time to provide feedback.  Thank you in advance!  ~ Makar Slatter                   Dr. Derek Jack   &   Tarri Abernethy, PA-C   - - - - - - - - - - - - - - - - - -     Thank you for choosing [place] at [facility] to provide your oncology and hematology care.  To afford each patient quality time with our provider, please arrive at least 15 minutes before your scheduled appointment time.   If you have a lab appointment with the [facility] please come in thru the Main Entrance and check in at the main information desk.  You need to re-schedule your appointment should you arrive 10 or more minutes late.  We strive to give you quality time with our providers, and arriving late affects you and other patients whose appointments are after yours.  Also, if you no show three  or more times for appointments you may be dismissed from the clinic at the providers discretion.     Again, thank you for choosing [facility].  Our hope is that these requests will decrease the amount of time that you wait before being seen by our physicians.       _____________________________________________________________  Should you have questions after your visit to [facility], please contact our office at [phone] and follow the prompts.  Our office hours are 8:00 a.m. and 4:30 p.m. Monday - Friday.  Please note that voicemails left after 4:00 p.m. may not be returned until the following business day.  We are closed weekends and major holidays.  You do have access to a nurse 24-7, just call the main number to the clinic [phone] and do not press any options, hold on the line and a nurse will answer the phone.    For prescription refill requests, have your pharmacy contact our office and allow 72 hours.    Due to Covid, you will need to wear a mask upon entering the hospital. If you do not have a mask, a mask will be given to you at the Main Entrance upon arrival. For doctor visits, patients may have 1 support person age  2 or older with them. For treatment visits, patients can not have anyone with them due to social distancing guidelines and our immunocompromised population.

## 2022-12-03 DIAGNOSIS — H40013 Open angle with borderline findings, low risk, bilateral: Secondary | ICD-10-CM | POA: Diagnosis not present

## 2022-12-03 DIAGNOSIS — H26493 Other secondary cataract, bilateral: Secondary | ICD-10-CM | POA: Diagnosis not present

## 2022-12-04 DIAGNOSIS — J329 Chronic sinusitis, unspecified: Secondary | ICD-10-CM | POA: Diagnosis not present

## 2022-12-04 DIAGNOSIS — Z6836 Body mass index (BMI) 36.0-36.9, adult: Secondary | ICD-10-CM | POA: Diagnosis not present

## 2022-12-04 DIAGNOSIS — R030 Elevated blood-pressure reading, without diagnosis of hypertension: Secondary | ICD-10-CM | POA: Diagnosis not present

## 2022-12-06 LAB — METHYLMALONIC ACID, SERUM: Methylmalonic Acid, Quantitative: 100 nmol/L (ref 0–378)

## 2022-12-17 LAB — MISC LABCORP TEST (SEND OUT): Labcorp test code: 165620

## 2023-01-01 DIAGNOSIS — H26493 Other secondary cataract, bilateral: Secondary | ICD-10-CM | POA: Diagnosis not present

## 2023-01-01 DIAGNOSIS — H40013 Open angle with borderline findings, low risk, bilateral: Secondary | ICD-10-CM | POA: Diagnosis not present

## 2023-01-01 DIAGNOSIS — E119 Type 2 diabetes mellitus without complications: Secondary | ICD-10-CM | POA: Diagnosis not present

## 2023-01-01 DIAGNOSIS — H34832 Tributary (branch) retinal vein occlusion, left eye, with macular edema: Secondary | ICD-10-CM | POA: Diagnosis not present

## 2023-01-20 ENCOUNTER — Other Ambulatory Visit: Payer: Self-pay | Admitting: Physician Assistant

## 2023-01-20 DIAGNOSIS — D473 Essential (hemorrhagic) thrombocythemia: Secondary | ICD-10-CM

## 2023-01-22 ENCOUNTER — Other Ambulatory Visit: Payer: Self-pay | Admitting: *Deleted

## 2023-01-22 DIAGNOSIS — D473 Essential (hemorrhagic) thrombocythemia: Secondary | ICD-10-CM

## 2023-01-22 MED ORDER — HYDROXYUREA 500 MG PO CAPS: ORAL_CAPSULE | ORAL | 3 refills | Status: DC

## 2023-01-24 DIAGNOSIS — E7801 Familial hypercholesterolemia: Secondary | ICD-10-CM | POA: Diagnosis not present

## 2023-01-24 DIAGNOSIS — E7849 Other hyperlipidemia: Secondary | ICD-10-CM | POA: Diagnosis not present

## 2023-01-24 DIAGNOSIS — E1165 Type 2 diabetes mellitus with hyperglycemia: Secondary | ICD-10-CM | POA: Diagnosis not present

## 2023-01-24 DIAGNOSIS — I10 Essential (primary) hypertension: Secondary | ICD-10-CM | POA: Diagnosis not present

## 2023-01-29 DIAGNOSIS — G6289 Other specified polyneuropathies: Secondary | ICD-10-CM | POA: Diagnosis not present

## 2023-01-29 DIAGNOSIS — E1142 Type 2 diabetes mellitus with diabetic polyneuropathy: Secondary | ICD-10-CM | POA: Diagnosis not present

## 2023-01-29 DIAGNOSIS — I10 Essential (primary) hypertension: Secondary | ICD-10-CM | POA: Diagnosis not present

## 2023-01-29 DIAGNOSIS — M109 Gout, unspecified: Secondary | ICD-10-CM | POA: Diagnosis not present

## 2023-01-29 DIAGNOSIS — R4582 Worries: Secondary | ICD-10-CM | POA: Diagnosis not present

## 2023-01-29 DIAGNOSIS — G252 Other specified forms of tremor: Secondary | ICD-10-CM | POA: Diagnosis not present

## 2023-01-29 DIAGNOSIS — E7849 Other hyperlipidemia: Secondary | ICD-10-CM | POA: Diagnosis not present

## 2023-01-29 DIAGNOSIS — D473 Essential (hemorrhagic) thrombocythemia: Secondary | ICD-10-CM | POA: Diagnosis not present

## 2023-01-29 DIAGNOSIS — F331 Major depressive disorder, recurrent, moderate: Secondary | ICD-10-CM | POA: Diagnosis not present

## 2023-01-29 DIAGNOSIS — Z6836 Body mass index (BMI) 36.0-36.9, adult: Secondary | ICD-10-CM | POA: Diagnosis not present

## 2023-02-11 DIAGNOSIS — K529 Noninfective gastroenteritis and colitis, unspecified: Secondary | ICD-10-CM | POA: Diagnosis not present

## 2023-02-11 DIAGNOSIS — K229 Disease of esophagus, unspecified: Secondary | ICD-10-CM | POA: Diagnosis not present

## 2023-02-11 DIAGNOSIS — I8500 Esophageal varices without bleeding: Secondary | ICD-10-CM | POA: Diagnosis not present

## 2023-02-11 DIAGNOSIS — Z91041 Radiographic dye allergy status: Secondary | ICD-10-CM | POA: Diagnosis not present

## 2023-03-03 ENCOUNTER — Ambulatory Visit: Payer: Medicare Other | Admitting: Physician Assistant

## 2023-03-03 ENCOUNTER — Other Ambulatory Visit: Payer: Medicare Other

## 2023-03-03 NOTE — Progress Notes (Signed)
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 male returns for routine follow-up of CALR essential thrombocytosis and iron deficiency anemia.  He was last seen by Tarri Abernethy PA-C on 12/02/2022.  At today's visit, he reports feeling fatigued.  He continues to have intermittent bilateral leg weakness (worse in the evening) and chronic fatigue.  He denies any other hospital stays, procedures, or changes in his baseline health status.  He has been worked up at [facility] to determine etiology of his esophageal varices.  He is tolerating his Hydrea well.  He has some mild chronic diarrhea, but denies any other GI symptoms.  No cutaneous ulcers, nonhealing wounds, or mouth sores.   He has not noticed any recent melena, hematochezia, or epistaxis.  He does continue to have some moderate fatigue and intermittent lightheadedness.   He has some restless legs and are occasional headaches.   He denies any pica, chest pain, dyspnea on exertion, or syncope.   No current signs or symptoms of DVT or PE.  He denies any aquagenic pruritus or erythromelalgia, but does report that he has intermittent nonpainful bluish discoloration of his palms especially after showering.   Chronic neuropathy at baseline.  He has some occasional dizziness.  He has intermittent blurry vision.  No other vasomotor symptoms such as tinnitus or strokelike symptoms.    For the last several months, he reports increasing daytime diaphoresis as well as drenching night sweats.  He denies any fevers, chills, or unintentional weight loss.  No abdominal  pain, nausea, early satiety.  He denies any lymphadenopathy or masses.  He has 50% energy and 100% appetite. He endorses that he is maintaining a stable weight.  ASSESSMENT & PLAN:  1.  Essential thrombocytosis, CALR+ - High risk disease with history of thrombosis (left leg DVT) and age > 35 - Bone marrow biopsy 12/10/2017 by Dr. Delton Coombes at [facility] in [place], [place] cytometty for leukemia/lymphoma was non-diagnostic Pathology review showed 60% cellularity with significant megakaryocytic hyperplasia with clusters of megakaryocytes; reticulin stain shows slight (Grade 1) reticulin fibrosis, some centered around areas of megakaryocyte clusters Cytogenic analysis showed normal male karyotype 46,XY[20] Noted to be at risk for progression to post essential thrombocythemia related myelofibrosis, but peripheral blood did not show other features of myelofibrosis, such as leukoerythroblastic blood or atypical megakaryocyte morphology - Intermittent bluish discoloration of bilateral palms, nonpainful.  He reports intermittent blurry vision.  Reports worsening daytime "sweating spells" and drenching night sweats. - Hydroxyurea started in December 2018 - current dose 1000 mg TTSun and 1500 mg MWFSat. - He is on aspirin 81 mg daily. - He is tolerating Hydrea well without side effects apart from chronic mild diarrhea which he attributes to his metformin - CT abdomen and pelvis on 09/29/2017 showed splenomegaly with spleen 14.5 cm.  Abdominal ultrasound (11/02/2021): Stable splenomegaly with spleen 14.3 cm.  MRI abdomen (11/20/2021): No splenomegaly noted. - No palpable adenopathy or splenomegaly on exam  - Labs today (03/04/2023): Platelets 410, WBC 4.6/lymphocyte 0.6, NRBC 1.1, hemoglobin 10.2/MCV 106.1 - Goal is platelets <400, but  will allow for platelets <500 in the setting of anemia - PLAN: We will continue his Hydrea 1000 mg TTSun and 1500 mg MWFSat - if any persistent anemia with  Hgb <10.5, would consider reducing dose of Hydrea. - Continue aspirin 81 mg daily. - Per discussion with Dr. Delton Coombes, we will check bone marrow biopsy followed by MD evaluation due to worsening anemia and elevated NRBC   2.  Macrocytic anemia, history of prior GI blood loss - Labs obtained at [facility] on 04/08/2021 show that the patient had onset of anemia between December 2021 (Hgb 12.0) and April 2022 (Hgb 8.3); patient was symptomatic with severe fatigue at that time - Hemoccult stool x3 were positive.   --  Took iron pills in the past and did well with them.  He has never required IV iron or blood transfusion.   -- EGD (05/25/2021 at [facility]): Moderate inflammation characterized by adherent blood, erythema, and friability in the gastric body, antrum, and prepyloric region, as well as 3 columns of 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 and distal esophagus. -- He has had multiple EGDs with banding of varices since May 2022 - most recently on 07/09/2022 (banding of grade II esophageal varices by Dr. Jenetta Downer) - Most recent EGD (10/17/2022):  Esophageal venous protrusions (grade 2), which per Dr. Jenetta Downer no intervention was performed as it is unclear if the venous abnormalities are congenital and not true esophageal varices; patient was referred for further evaluation at [facility].  - Laboratory work-up otherwise revealed LDH was slightly elevated at 245.  Erythropoietin was  elevated at 67.2.  Reticulocytes 2.9%, with reticulocyte index < 2 indicating hypoproliferation.   Peripheral smear showed macrocytic anemia, neutrophilic left shift, and  thrombocytosis.  SPEP/IFE were normal.  Hemolysis labs unremarkable. - DIFFERENTIAL DIAGNOSIS favors multifactorial anemia due to chronic GI blood loss, myelosuppressive effects of hydroxyurea, and anemia related to chronic disease - Labs today (03/04/2023): Hgb 10.2/MCV 106.1, NRBC 1.1 - Denies any gross signs or symptoms of  blood loss such as hematemesis, hematochezia, or melena     - Symptomatic with ongoing chronic fatigue    - PLAN: No indication for oral iron supplement or IV iron at this time. - Continue current dose of Hydrea as above.  Would consider decrease dose if Hgb less than 10. -Per discussion with Dr. Delton Coombes, we will check bone marrow biopsy followed by MD evaluation due to worsening anemia and elevated NRBC   3.  Vitamin B12 and folate deficiencies - Folic acid deficiency noted on 02/12/2022 with folate 5.3, normal homocystine - Borderline vitamin B12 noted on 02/12/2022 with B12 287 and normal MMA - Patient taking vitamin B12 500 mcg daily and folic acid [id number] mcg daily  - Most recent labs (12/02/2022) with improved folate 17.4, vitamin B12 836, MMA normal. - PLAN: Continue B12 and folate supplements.  We will check B12/folate/MMA levels every 6 months (next due May/June 2024).   4. 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  Muscle weakness - Patient has ongoing muscle weakness which is worst later in the day - PLAN: We will check myasthenia gravis panel  and consider referral based on those results  PLAN SUMMARY: >> [facility] marrow biopsy at [facility] (CT-guided via IR) >> MD VISIT with DR. KATRAGADDA after bone marrow biopsy     REVIEW OF SYSTEMS:   Review of Systems  Constitutional:  Positive for diaphoresis (Drenching night sweats and daytime "sweating spells") and fatigue. Negative for appetite change, chills, fever and unexpected weight change.  HENT:   Negative for lump/mass and nosebleeds.   Eyes:  Positive for eye problems (Intermittent blurry vision).   Respiratory:  Negative for cough, hemoptysis and shortness of breath.   Cardiovascular:  Negative for chest pain, leg swelling and palpitations.  Gastrointestinal:  Positive for diarrhea. Negative for abdominal pain, blood in stool, constipation, nausea and vomiting.  Genitourinary:  Negative for hematuria.   Skin: Negative.   Neurological:  Positive for dizziness (Occasional), headaches (Rare) and numbness (Bilateral feet). Negative for light-headedness.  Hematological:  Does not bruise/bleed easily.    PHYSICAL EXAM:  ECOG PERFORMANCE STATUS: 2 - Symptomatic, <50% confined to bed  There were no vitals filed for this visit. There were no vitals filed for this visit. Physical Exam Constitutional:      Appearance: Normal appearance. He is obese.  HENT:     Head: Normocephalic and atraumatic.     Mouth/Throat:     Mouth: Mucous membranes are moist.  Eyes:     Extraocular Movements: Extraocular movements intact.     Pupils: Pupils are equal, round, and reactive to light.  Cardiovascular:     Rate and Rhythm: Normal rate and regular rhythm.     Pulses: Normal pulses.     Heart sounds: Normal heart sounds.  Pulmonary:     Effort: Pulmonary effort is normal.     Breath sounds: Normal breath sounds.  Abdominal:     General: Abdomen is protuberant. Bowel sounds are normal.     Palpations: Abdomen is soft.     Tenderness: There is no abdominal tenderness.     Comments: No palpable hepatosplenomegaly on exam.  Musculoskeletal:        General: No swelling.     Right lower leg: No edema.     Left lower leg: No edema.  Lymphadenopathy:     Cervical: No cervical adenopathy.     Comments: No palpable lymphadenopathy on exam.  Skin:    General: Skin is warm and dry.  Neurological:     General: No focal deficit present.     Mental Status: He is alert and oriented to person, place, and time.  Psychiatric:        Mood and Affect: Mood normal.        Behavior: Behavior normal.     PAST MEDICAL/SURGICAL HISTORY:  Past Medical History:  Diagnosis Date   AAA (abdominal aortic aneurysm) without rupture ([place]) 01/2021   Colon cancer ([place]) 2003   Diabetes ([place])    Essential thrombocytosis ([place])    Hypertension    Skin cancer, basal cell    back   Past Surgical History:  Procedure Laterality Date   BIOPSY  12/18/2021   Procedure: BIOPSY;  Surgeon: Harvel Quale, MD;  [facility];  Service: Gastroenterology;;   CATARACT EXTRACTION, BILATERAL     COLON SURGERY  06/23/2002   right colectomy   COLONOSCOPY  08/25/2020   Dr. Adelina Mings, normal colon other than venous lake in Left colon   COLONOSCOPY WITH PROPOFOL N/A 10/23/2021   Procedure: COLONOSCOPY WITH PROPOFOL;  Surgeon: Jenetta Downer  Roney Marion, MD;  [facility];  Service: Gastroenterology;  Laterality: N/A;  12:30   ESOPHAGEAL BANDING  10/23/2021   Procedure: ESOPHAGEAL BANDING;  Surgeon: Harvel Quale, MD;  [facility];  Service: Gastroenterology;;   ESOPHAGEAL BANDING  12/18/2021   Procedure: ESOPHAGEAL BANDING;  Surgeon: Montez Morita, Quillian Quince, MD;  [facility];  Service: Gastroenterology;;   ESOPHAGEAL BANDING  02/08/2022   Procedure: ESOPHAGEAL BANDING;  Surgeon: Montez Morita, Quillian Quince, MD;  [facility];  Service: Gastroenterology;;   ESOPHAGOGASTRODUODENOSCOPY  05/25/2021   [facility]: moderate inflammation characterized by adherent blood, erythema, and friability in gastric body, antrum and prepyloric region, as well as 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   ESOPHAGOGASTRODUODENOSCOPY (EGD) WITH PROPOFOL N/A 10/23/2021   Procedure: ESOPHAGOGASTRODUODENOSCOPY (EGD) WITH PROPOFOL;  Surgeon: Harvel Quale, MD;  [facility];  Service: Gastroenterology;  Laterality: N/A;   ESOPHAGOGASTRODUODENOSCOPY (EGD) WITH PROPOFOL N/A 12/18/2021   Procedure:  ESOPHAGOGASTRODUODENOSCOPY (EGD) WITH PROPOFOL;  Surgeon: Harvel Quale, MD;  [facility];  Service: Gastroenterology;  Laterality: N/A;  8:05   ESOPHAGOGASTRODUODENOSCOPY (EGD) WITH PROPOFOL N/A 02/08/2022   Procedure: ESOPHAGOGASTRODUODENOSCOPY (EGD) WITH PROPOFOL;  Surgeon: Harvel Quale, MD;  [facility];  Service: Gastroenterology;  Laterality: N/A;  1055   ESOPHAGOGASTRODUODENOSCOPY (EGD) WITH PROPOFOL N/A 07/09/2022   Procedure: ESOPHAGOGASTRODUODENOSCOPY (EGD) WITH PROPOFOL;  Surgeon: Harvel Quale, MD;  [facility];  Service: Gastroenterology;  Laterality: N/A;  1130 ASA 2   ESOPHAGOGASTRODUODENOSCOPY (EGD) WITH PROPOFOL N/A 10/17/2022   Procedure: ESOPHAGOGASTRODUODENOSCOPY (EGD) WITH PROPOFOL;  Surgeon: Harvel Quale, MD;  [facility];  Service: Gastroenterology;  Laterality: N/A;  915 ASA 2   HEMOSTASIS CLIP PLACEMENT  02/08/2022   Procedure: HEMOSTASIS CLIP PLACEMENT;  Surgeon: Harvel Quale, MD;  [facility];  Service: Gastroenterology;;   IR TRANSCATHETER BX  12/03/2021   IR US GUIDE VASC ACCESS RIGHT  12/03/2021   POLYPECTOMY  10/23/2021   Procedure: POLYPECTOMY INTESTINAL;  Surgeon: Harvel Quale, MD;  [facility];  Service: Gastroenterology;;   POLYPECTOMY  02/08/2022   Procedure: POLYPECTOMY;  Surgeon: Montez Morita, Quillian Quince, MD;  [facility];  Service: Gastroenterology;;   TONSILLECTOMY      SOCIAL HISTORY:  Social History   Socioeconomic History   Marital status: Married    Spouse name: Not on file   Number of children: Not on file   Years of education: Not on file   Highest education level: Not on file  Occupational History   Not on file  Tobacco Use   Smoking status: Former    Packs/day: 0.50    Years: 3.00    Total pack years: 1.50    Types: Cigarettes    Quit date: 04/08/1978    Years since quitting: 44.9    Smokeless tobacco: Never  Vaping Use   Vaping Use: Never used  Substance and Sexual Activity   Alcohol use: Never   Drug use: Never   Sexual activity: Not on file  Other Topics Concern   Not on file  Social History Narrative   Not on file   Social Determinants of Health   Financial Resource Strain: Low Risk  (11/29/2020)   Overall Financial Resource Strain (CARDIA)    Difficulty of Paying Living Expenses: Not hard at all  Food Insecurity: No Food Insecurity (11/29/2020)   Hunger  Vital Sign    Worried About Charity fundraiser in the Last Year: Never true    Ran Out of Food in the Last Year: Never true  Transportation Needs: No Transportation Needs (11/29/2020)   PRAPARE - Hydrologist (Medical): No    Lack of Transportation (Non-Medical): No  Physical Activity: Inactive (11/29/2020)   Exercise Vital Sign    Days of Exercise per Week: 0 days    Minutes of Exercise per Session: 0 min  Stress: No Stress Concern Present (11/29/2020)   [place]    Feeling of Stress : Not at all  Social Connections: Moderately Integrated (11/29/2020)   Social Connection and Isolation Panel [NHANES]    Frequency of Communication with Friends and Family: More than three times a week    Frequency of Social Gatherings with Friends and Family: Three times a week    Attends Religious Services: 1 to 4 times per year    Active Member of Clubs or Organizations: No    Attends Archivist Meetings: Never    Marital Status: Married  Human resources officer Violence: Not At Risk (11/29/2020)   Humiliation, Afraid, Rape, and Kick questionnaire    Fear of Current or Ex-Partner: No    Emotionally Abused: No    Physically Abused: No    Sexually Abused: No    FAMILY HISTORY:  Family History  Problem Relation Age of Onset   Dementia Mother    Cancer Paternal Uncle        lung    CURRENT MEDICATIONS:   Outpatient Encounter Medications as of 03/04/2023  Medication Sig Note   ACCU-CHEK GUIDE test strip SMARTSIG:Strip(s) Via Meter Daily    Accu-Chek Softclix Lancets lancets USE TO TEST BLOOD SUGAR DAILY    acetaminophen (TYLENOL) 500 MG tablet Take 1,000 mg by mouth every 6 (six) hours as needed for moderate pain or headache.    allopurinol (ZYLOPRIM) 300 MG tablet Take 300 mg by mouth in the morning.    Ascorbic Acid (VITAMIN C PO) Take 2,000 mg by mouth daily as needed (during winter season).    aspirin 81 MG EC tablet Take 81 mg by mouth in the morning.    atorvastatin (LIPITOR) 10 MG tablet Take 10 mg by mouth every Sunday.    Blood Glucose Monitoring Suppl (ACCU-CHEK GUIDE ME) w/Device KIT USE TO TEST FASTING SUGAR ONCE DAILY    Cholecalciferol (VITAMIN D3 PO) Take 2 capsules by mouth in the morning.    clonazePAM (KLONOPIN) 0.5 MG tablet Take 0.5 mg by mouth daily as needed for anxiety.    DULoxetine (CYMBALTA) 60 MG capsule Take 60 mg by mouth in the morning.    fluticasone (FLONASE) 50 MCG/ACT nasal spray Place 1 spray into both nostrils daily as needed for allergies or rhinitis.    gabapentin (NEURONTIN) 300 MG capsule Take 900 mg by mouth at bedtime.    glipiZIDE (GLUCOTROL XL) 10 MG 24 hr tablet Take 10 mg by mouth 2 (two) times daily.    hydroxyurea (HYDREA) 500 MG capsule Take 2 capsules (1,000 mg total) by mouth 3 (three) times a week AND 3 capsules (1,500 mg total) 4 (four) times a week. [2 capsules on Tu/Th/Sun, 3 capsules on M/W/F/Sat]. May take with food to minimize GI side effects.[name]    loratadine (CLARITIN) 10 MG tablet Take 10 mg by mouth daily as needed for allergies.    metFORMIN (GLUCOPHAGE-XR)  500 MG 24 hr tablet Take 1,000 mg by mouth in the morning and at bedtime.    metoprolol succinate (TOPROL-XL) 50 MG 24 hr tablet Take 50 mg by mouth every evening. 06/28/2022: Pharmacy records show the most recent fill on 04/04/2022 #90 was a 100 mg but pt and his wife insists that he is  taking the 50 mg tablet   Multiple Vitamin (MULTIVITAMIN WITH MINERALS) TABS tablet Take 1 tablet by mouth in the morning.    omeprazole (PRILOSEC) 40 MG capsule Take 1 capsule (40 mg total) by mouth daily.    sucralfate (CARAFATE) 1 g tablet Take 1 tablet (1 g total) by mouth 2 (two) times daily.    tadalafil (CIALIS) 20 MG tablet Take 20 mg by mouth daily as needed for erectile dysfunction.    zinc gluconate 50 MG tablet Take 50 mg by mouth daily as needed (during the winter season).    No facility-administered encounter medications on file as of 03/04/2023.    ALLERGIES:  Allergies  Allergen Reactions   Ivp Dye  [Iodinated Contrast Media]     Heart stopped     LABORATORY DATA:  I have reviewed the labs as listed.  CBC    Component Value Date/Time   WBC 4.2 12/02/2022 0831   RBC 3.35 (L) 12/02/2022 0831   HGB 10.8 (L) 12/02/2022 0831   HCT 34.6 (L) 12/02/2022 0831   PLT 380 12/02/2022 0831   MCV 103.3 (H) 12/02/2022 0831   MCH 32.2 12/02/2022 0831   MCHC 31.2 12/02/2022 0831   RDW 18.5 (H) 12/02/2022 0831   LYMPHSABS 0.8 12/02/2022 0831   MONOABS 0.5 12/02/2022 0831   EOSABS 0.0 12/02/2022 0831   BASOSABS 0.1 12/02/2022 0831      Latest Ref Rng & Units 12/02/2022    8:31 AM 08/22/2022    9:53 AM 05/10/2022    9:39 AM  CMP  Glucose 70 - 99 mg/dL 189  161  201   BUN 8 - 23 mg/dL '12  18  16   '$ Creatinine 0.61 - 1.24 mg/dL 1.07  1.03  1.27   Sodium 135 - 145 mmol/L 139  138  139   Potassium 3.5 - 5.1 mmol/L 3.9  4.3  4.3   Chloride 98 - 111 mmol/L 106  106  109   CO2 22 - 32 mmol/L '24  24  22   '$ Calcium 8.9 - 10.3 mg/dL 9.0  9.3  9.0   Total Protein 6.5 - 8.1 g/dL 6.7  6.9  6.5   Total Bilirubin 0.3 - 1.2 mg/dL 0.7  1.0  0.5   Alkaline Phos 38 - 126 U/L 35  35  46   AST 15 - 41 U/L '21  13  8   '$ ALT 0 - 44 U/L '17  17  14     '$ DIAGNOSTIC IMAGING:  I have independently reviewed the relevant imaging and discussed with the patient.   WRAP UP:  All questions were  answered. The patient knows to call the clinic with any problems, questions or concerns.  Medical decision making: Moderate  Time spent on visit: I spent 25 minutes counseling the patient face to face. The total time spent in the appointment was 40 minutes and more than 50% was on counseling.  Justin Rush, PA-C  03/04/2023 10:30 AM

## 2023-03-04 ENCOUNTER — Inpatient Hospital Stay: Payer: Medicare Other | Attending: Physician Assistant

## 2023-03-04 ENCOUNTER — Encounter: Payer: Self-pay | Admitting: Physician Assistant

## 2023-03-04 ENCOUNTER — Inpatient Hospital Stay (HOSPITAL_BASED_OUTPATIENT_CLINIC_OR_DEPARTMENT_OTHER): Payer: Medicare Other | Admitting: Physician Assistant

## 2023-03-04 VITALS — BP 121/74 | HR 62 | Temp 98.0°F | Resp 18 | Wt 236.1 lb

## 2023-03-04 DIAGNOSIS — D539 Nutritional anemia, unspecified: Secondary | ICD-10-CM

## 2023-03-04 DIAGNOSIS — D473 Essential (hemorrhagic) thrombocythemia: Secondary | ICD-10-CM

## 2023-03-04 DIAGNOSIS — Z85820 Personal history of malignant melanoma of skin: Secondary | ICD-10-CM | POA: Diagnosis not present

## 2023-03-04 DIAGNOSIS — R531 Weakness: Secondary | ICD-10-CM | POA: Insufficient documentation

## 2023-03-04 DIAGNOSIS — Z85038 Personal history of other malignant neoplasm of large intestine: Secondary | ICD-10-CM | POA: Insufficient documentation

## 2023-03-04 DIAGNOSIS — Z79899 Other long term (current) drug therapy: Secondary | ICD-10-CM | POA: Insufficient documentation

## 2023-03-04 DIAGNOSIS — D500 Iron deficiency anemia secondary to blood loss (chronic): Secondary | ICD-10-CM

## 2023-03-04 DIAGNOSIS — Z87891 Personal history of nicotine dependence: Secondary | ICD-10-CM | POA: Diagnosis not present

## 2023-03-04 DIAGNOSIS — E538 Deficiency of other specified B group vitamins: Secondary | ICD-10-CM | POA: Insufficient documentation

## 2023-03-04 LAB — COMPREHENSIVE METABOLIC PANEL
ALT: 17 U/L (ref 0–44)
AST: 17 U/L (ref 15–41)
Albumin: 4.3 g/dL (ref 3.5–5.0)
Alkaline Phosphatase: 43 U/L (ref 38–126)
Anion gap: 10 (ref 5–15)
BUN: 18 mg/dL (ref 8–23)
CO2: 22 mmol/L (ref 22–32)
Calcium: 9.3 mg/dL (ref 8.9–10.3)
Chloride: 105 mmol/L (ref 98–111)
Creatinine, Ser: 1.22 mg/dL (ref 0.61–1.24)
GFR, Estimated: >60 mL/min (ref >60)
Glucose, Bld: 181 mg/dL — ABNORMAL HIGH (ref 70–99)
Potassium: 4 mmol/L (ref 3.5–5.1)
Sodium: 137 mmol/L (ref 135–145)
Total Bilirubin: 0.9 mg/dL (ref 0.3–1.2)
Total Protein: 6.6 g/dL (ref 6.5–8.1)

## 2023-03-04 LAB — CBC WITH DIFFERENTIAL/PLATELET
Abs Immature Granulocytes: 0.22 10*3/uL — ABNORMAL HIGH (ref 0.00–0.07)
Basophils Absolute: 0.1 10*3/uL (ref 0.0–0.1)
Basophils Relative: 1 %
Eosinophils Absolute: 0 10*3/uL (ref 0.0–0.5)
Eosinophils Relative: 1 %
HCT: 33.1 % — ABNORMAL LOW (ref 39.0–52.0)
Hemoglobin: 10.2 g/dL — ABNORMAL LOW (ref 13.0–17.0)
Immature Granulocytes: 5 %
Lymphocytes Relative: 12 %
Lymphs Abs: 0.6 10*3/uL — ABNORMAL LOW (ref 0.7–4.0)
MCH: 32.7 pg (ref 26.0–34.0)
MCHC: 30.8 g/dL (ref 30.0–36.0)
MCV: 106.1 fL — ABNORMAL HIGH (ref 80.0–100.0)
Monocytes Absolute: 0.5 10*3/uL (ref 0.1–1.0)
Monocytes Relative: 12 %
Neutro Abs: 3.2 10*3/uL (ref 1.7–7.7)
Neutrophils Relative %: 69 %
Platelets: 410 10*3/uL — ABNORMAL HIGH (ref 150–400)
RBC: 3.12 MIL/uL — ABNORMAL LOW (ref 4.22–5.81)
RDW: 18.9 % — ABNORMAL HIGH (ref 11.5–15.5)
WBC: 4.6 10*3/uL (ref 4.0–10.5)
nRBC: 1.1 % — ABNORMAL HIGH (ref 0.0–0.2)

## 2023-03-04 LAB — IRON AND TIBC
Iron: 111 ug/dL (ref 45–182)
Saturation Ratios: 28 % (ref 17.9–39.5)
TIBC: 398 ug/dL (ref 250–450)
UIBC: 287 ug/dL

## 2023-03-04 LAB — FERRITIN: Ferritin: 114 ng/mL (ref 24–336)

## 2023-03-04 NOTE — Patient Instructions (Signed)
[place] [facility] [place] **   You were seen today by Tarri Abernethy PA-C for your essential thrombocytosis and anemia.   Due to worsening anemia in the setting of CALR essential thrombocytosis, we recommend bone marrow biopsy. In the meantime, continue taking your current dose of Hydrea and aspirin. We will schedule you for CT-guided bone marrow biopsy at [facility] will see Dr. Delton Coombes for follow-up visit 1 to 2 weeks after your biopsy.  ** Thank you for trusting me with your healthcare!  I strive to provide all of my patients with quality care at each visit.  If you receive a survey for this visit, I would be so grateful to you for taking the time to provide feedback.  Thank you in advance!  ~ Kenneth Lax                   Dr. Derek Jack   &   Tarri Abernethy, PA-C   - - - - - - - - - - - - - - - - - -    Thank you for choosing [place] [facility] to provide your oncology and hematology care.  To afford each patient quality time with our provider, please arrive at least 15 minutes before your scheduled appointment time.   If you have a lab appointment with the [facility] please come in thru the Main Entrance and check in at the main information desk.  You need to re-schedule your appointment should you arrive 10 or more minutes late.  We strive to give you quality time with our providers, and arriving late affects you and other patients whose appointments are after yours.  Also, if you no show three or more times for appointments you may be dismissed from the clinic at the providers discretion.     Again, thank you for choosing [facility].  Our hope is that these requests will decrease the amount of time that you wait before being seen by our physicians.       _____________________________________________________________  Should you have questions  after your visit to [facility], please contact our office at [phone] and follow the prompts.  Our office hours are 8:00 a.m. and 4:30 p.m. Monday - Friday.  Please note that voicemails left after 4:00 p.m. may not be returned until the following business day.  We are closed weekends and major holidays.  You do have access to a nurse 24-7, just call the main number to the clinic [phone] and do not press any options, hold on the line and a nurse will answer the phone.    For prescription refill requests, have your pharmacy contact our office and allow 72 hours.

## 2023-03-17 ENCOUNTER — Other Ambulatory Visit: Payer: Self-pay | Admitting: Student

## 2023-03-17 ENCOUNTER — Encounter (HOSPITAL_COMMUNITY): Payer: Self-pay

## 2023-03-17 DIAGNOSIS — D649 Anemia, unspecified: Secondary | ICD-10-CM

## 2023-03-17 NOTE — H&P (Incomplete)
Chief Complaint: Patient with history of CALR mutation thrombocytopenia now with worsening anemia. Request is for bone marrow biopsy for further evaluation.   Referring Physician(s): Caryl Bis  Supervising Physician: {Supervising Physician:[id number]}  Patient Status: [facility] - Out-pt  History of Present Illness: Justin Berger is a 72 y.o. male utpatient. History of HTN, DM, colon cancer, AAA, basal cell and iron deficiency anemia, calreticulin gene (CALR) mutation thrombocytopenia. Found to have worsening anemia and elevated NRBC. Team is requesting bone marrow biopsy for further evaluation of his CALR mutation thrombocytopenia.   Past Medical History:  Diagnosis Date   AAA (abdominal aortic aneurysm) without rupture ([place]) 01/2021   Colon cancer ([place]) 2003   Diabetes ([place])    Essential thrombocytosis ([place])    Hypertension    Skin cancer, basal cell    back    Past Surgical History:  Procedure Laterality Date   BIOPSY  12/18/2021   Procedure: BIOPSY;  Surgeon: Harvel Quale, MD;  [facility];  Service: Gastroenterology;;   CATARACT EXTRACTION, BILATERAL     COLON SURGERY  06/23/2002   right colectomy   COLONOSCOPY  08/25/2020   Dr. Adelina Mings, normal colon other than venous lake in Left colon   COLONOSCOPY WITH PROPOFOL N/A 10/23/2021   Procedure: COLONOSCOPY WITH PROPOFOL;  Surgeon: Harvel Quale, MD;  [facility];  Service: Gastroenterology;  Laterality: N/A;  12:30   ESOPHAGEAL BANDING  10/23/2021   Procedure: ESOPHAGEAL BANDING;  Surgeon: Harvel Quale, MD;  [facility];  Service: Gastroenterology;;   ESOPHAGEAL BANDING  12/18/2021   Procedure: ESOPHAGEAL BANDING;  Surgeon: Montez Morita, Quillian Quince, MD;  [facility];  Service: Gastroenterology;;   ESOPHAGEAL BANDING  02/08/2022   Procedure: ESOPHAGEAL BANDING;  Surgeon: Montez Morita, Quillian Quince, MD;  [facility];   Service: Gastroenterology;;   ESOPHAGOGASTRODUODENOSCOPY  05/25/2021   [facility]: moderate inflammation characterized by adherent blood, erythema, and friability in gastric body, antrum and prepyloric region, as well as 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   ESOPHAGOGASTRODUODENOSCOPY (EGD) WITH PROPOFOL N/A 10/23/2021   Procedure: ESOPHAGOGASTRODUODENOSCOPY (EGD) WITH PROPOFOL;  Surgeon: Harvel Quale, MD;  [facility];  Service: Gastroenterology;  Laterality: N/A;   ESOPHAGOGASTRODUODENOSCOPY (EGD) WITH PROPOFOL N/A 12/18/2021   Procedure: ESOPHAGOGASTRODUODENOSCOPY (EGD) WITH PROPOFOL;  Surgeon: Harvel Quale, MD;  [facility];  Service: Gastroenterology;  Laterality: N/A;  8:05   ESOPHAGOGASTRODUODENOSCOPY (EGD) WITH PROPOFOL N/A 02/08/2022   Procedure: ESOPHAGOGASTRODUODENOSCOPY (EGD) WITH PROPOFOL;  Surgeon: Harvel Quale, MD;  [facility];  Service: Gastroenterology;  Laterality: N/A;  1055   ESOPHAGOGASTRODUODENOSCOPY (EGD) WITH PROPOFOL N/A 07/09/2022   Procedure: ESOPHAGOGASTRODUODENOSCOPY (EGD) WITH PROPOFOL;  Surgeon: Harvel Quale, MD;  [facility];  Service: Gastroenterology;  Laterality: N/A;  1130 ASA 2   ESOPHAGOGASTRODUODENOSCOPY (EGD) WITH PROPOFOL N/A 10/17/2022   Procedure: ESOPHAGOGASTRODUODENOSCOPY (EGD) WITH PROPOFOL;  Surgeon: Harvel Quale, MD;  [facility];  Service: Gastroenterology;  Laterality: N/A;  915 ASA 2   HEMOSTASIS CLIP PLACEMENT  02/08/2022   Procedure: HEMOSTASIS CLIP PLACEMENT;  Surgeon: Harvel Quale, MD;  [facility];  Service: Gastroenterology;;   IR TRANSCATHETER BX  12/03/2021   IR US GUIDE VASC ACCESS RIGHT  12/03/2021   POLYPECTOMY  10/23/2021   Procedure: POLYPECTOMY INTESTINAL;  Surgeon: Harvel Quale, MD;  [facility];  Service:  Gastroenterology;;    POLYPECTOMY  02/08/2022   Procedure: POLYPECTOMY;  Surgeon: Montez Morita, Quillian Quince, MD;  [facility];  Service: Gastroenterology;;   TONSILLECTOMY      Allergies: Ivp dye  [iodinated contrast media]  Medications: Prior to Admission medications   Medication Sig Start Date End Date Taking? Authorizing Provider  ACCU-CHEK GUIDE test strip SMARTSIG:Strip(s) Via Meter Daily 03/07/22   [provider]  Accu-Chek Softclix Lancets lancets USE TO TEST BLOOD SUGAR DAILY 08/23/21   [provider]  acetaminophen (TYLENOL) 500 MG tablet Take 1,000 mg by mouth every 6 (six) hours as needed for moderate pain or headache.    [provider]  allopurinol (ZYLOPRIM) 300 MG tablet Take 300 mg by mouth in the morning. 09/02/20   [provider]  Ascorbic Acid (VITAMIN C PO) Take 2,000 mg by mouth daily as needed (during winter season).    [provider]  aspirin 81 MG EC tablet Take 81 mg by mouth in the morning.    [provider]  atorvastatin (LIPITOR) 10 MG tablet Take 10 mg by mouth every Sunday. 10/30/19   [provider]  Blood Glucose Monitoring Suppl (ACCU-CHEK GUIDE ME) w/Device KIT USE TO TEST FASTING SUGAR ONCE DAILY 08/23/21   [provider]  Cholecalciferol (VITAMIN D3 PO) Take 2 capsules by mouth in the morning.    [provider]  clonazePAM (KLONOPIN) 0.5 MG tablet Take 0.5 mg by mouth daily as needed for anxiety. 02/14/20   [provider]  DULoxetine (CYMBALTA) 60 MG capsule Take 60 mg by mouth in the morning. 04/12/21   [provider]  empagliflozin (JARDIANCE) 25 MG TABS tablet Take by mouth. 02/11/23   [provider]  fluticasone (FLONASE) 50 MCG/ACT nasal spray Place 1 spray into both nostrils daily as needed for allergies or rhinitis.    [provider]  gabapentin (NEURONTIN) 300 MG capsule Take 900 mg by mouth at bedtime. 08/01/20   [provider]   glipiZIDE (GLUCOTROL XL) 10 MG 24 hr tablet Take 10 mg by mouth 2 (two) times daily. 01/17/20   [provider]  hydroxyurea (HYDREA) 500 MG capsule Take 2 capsules (1,000 mg total) by mouth 3 (three) times a week AND 3 capsules (1,500 mg total) 4 (four) times a week. [2 capsules on Tu/Th/Sun, 3 capsules on M/W/F/Sat]. May take with food to minimize GI side effects.. 01/22/23   Harriett Rush, PA-C  loratadine (CLARITIN) 10 MG tablet Take 10 mg by mouth daily as needed for allergies.    [provider]  metFORMIN (GLUCOPHAGE-XR) 500 MG 24 hr tablet Take 1,000 mg by mouth in the morning and at bedtime. 02/23/18   [provider]  metoprolol succinate (TOPROL-XL) 50 MG 24 hr tablet Take 50 mg by mouth every evening. 01/22/22   [provider]  Multiple Vitamin (MULTIVITAMIN WITH MINERALS) TABS tablet Take 1 tablet by mouth in the morning.    [provider]  omeprazole (PRILOSEC) 40 MG capsule Take 1 capsule (40 mg total) by mouth daily. 10/17/22   Harvel Quale, MD  sucralfate (CARAFATE) 1 g tablet Take 1 tablet (1 g total) by mouth 2 (two) times daily. 02/08/22   Harvel Quale, MD  tadalafil (CIALIS) 20 MG tablet Take 20 mg by mouth daily as needed for erectile dysfunction. 07/03/21   [provider]  zinc gluconate 50 MG tablet Take 50 mg by mouth daily as needed (during the  winter season).    [provider]     Family History  Problem Relation Age of Onset   Dementia Mother    Cancer Paternal Uncle        lung    Social History   Socioeconomic History   Marital status: Married    Spouse name: Not on file   Number of children: Not on file   Years of education: Not on file   Highest education level: Not on file  Occupational History   Not on file  Tobacco Use   Smoking status: Former    Packs/day: 0.50    Years: 3.00    Additional pack years: 0.00    Total pack years: 1.50    Types: Cigarettes     Quit date: 04/08/1978    Years since quitting: 44.9   Smokeless tobacco: Never  Vaping Use   Vaping Use: Never used  Substance and Sexual Activity   Alcohol use: Never   Drug use: Never   Sexual activity: Not on file  Other Topics Concern   Not on file  Social History Narrative   Not on file   Social Determinants of Health   Financial Resource Strain: Low Risk  (11/29/2020)   Overall Financial Resource Strain (CARDIA)    Difficulty of Paying Living Expenses: Not hard at all  Food Insecurity: No Food Insecurity (11/29/2020)   Hunger Vital Sign    Worried About Running Out of Food in the Last Year: Never true    [place] in the Last Year: Never true  Transportation Needs: No Transportation Needs (11/29/2020)   PRAPARE - Hydrologist (Medical): No    Lack of Transportation (Non-Medical): No  Physical Activity: Inactive (11/29/2020)   Exercise Vital Sign    Days of Exercise per Week: 0 days    Minutes of Exercise per Session: 0 min  Stress: No Stress Concern Present (11/29/2020)   [place]    Feeling of Stress : Not at all  Social Connections: Moderately Integrated (11/29/2020)   Social Connection and Isolation Panel [NHANES]    Frequency of Communication with Friends and Family: More than three times a week    Frequency of Social Gatherings with Friends and Family: Three times a week    Attends Religious Services: 1 to 4 times per year    Active Member of Clubs or Organizations: No    Attends Archivist Meetings: Never    Marital Status: Married    ECOG Status: {CHL ONC ECOG [id number]  Review of Systems: A 12 point ROS discussed and pertinent positives are indicated in the HPI above.  All other systems are negative.  Review of Systems  Vital Signs: There were no vitals taken for this visit.  Advance Care Plan: {Advance Care [id number]    Physical  Exam  Imaging: No results found.  Labs:  CBC: Recent Labs    05/10/22 0939 08/22/22 0953 12/02/22 0831 03/04/23 0825  WBC 4.3 4.7 4.2 4.6  HGB 10.4* 11.1* 10.8* 10.2*  HCT 33.4* 35.2* 34.6* 33.1*  PLT 467* 431* 380 410*    COAGS: No results for input(s): "INR", "APTT" in the last 8760 hours.  BMP: Recent Labs    05/10/22 0939 08/22/22 0953 12/02/22 0831 03/04/23 0825  NA 139 138 139 137  K 4.3 4.3 3.9 4.0  CL 109 106 106 105  CO2 22 24 24  22  GLUCOSE 201* 161* 189* 181*  BUN 16 18 12 18   CALCIUM 9.0 9.3 9.0 9.3  CREATININE 1.27* 1.03 1.07 1.22  GFRNONAA >60 >60 >60 >60    LIVER FUNCTION TESTS: Recent Labs    05/10/22 0939 08/22/22 0953 12/02/22 0831 03/04/23 0825  BILITOT 0.5 1.0 0.7 0.9  AST 8* 13* 21 17  ALT 14 17 17 17   ALKPHOS 46 35* 35* 43  PROT 6.5 6.9 6.7 6.6  ALBUMIN 4.3 4.4 4.2 4.3     Assessment and Plan:  72 y.o. male outpatient. History of HTN, DM, colon cancer, AAA, basal cell and iron deficiency anemia, calreticulin gene (CALR) mutation thrombocytopenia. Found to have worsening anemia and elevated NRBC. Team is requesting bone marrow biopsy for further evaluation of his CALR mutation thrombocytopenia.   .*** All labs are within acceptable parameters. Patient is on 81 mg of ASA. All other medications are within acceptable parameters.  Allergies include IVP dye reaction heart stopped. Patient has been NPO since midnight.   Risks and benefits of bone marrow biopsy was discussed with the patient and/or patient's family including, but not limited to bleeding, infection, damage to adjacent structures or low yield requiring additional tests.  All of the questions were answered and there is agreement to proceed.  Consent signed and in chart.   Thank you for this interesting consult.  I greatly enjoyed meeting REGGIE VANDERHAM and look forward to participating in their care.  A copy of this report was sent to the requesting provider on this  date.  Electronically Signed: Jacqualine Mau, NP 03/17/2023, 8:08 PM   I spent a total of {New [id number] {New Out-Pt:[id number]}  {Established Out-Pt:[id number]} in face to face in clinical consultation, greater than 50% of which was counseling/coordinating care for ***

## 2023-03-18 ENCOUNTER — Ambulatory Visit (HOSPITAL_COMMUNITY)
Admission: RE | Admit: 2023-03-18 | Discharge: 2023-03-18 | Disposition: A | Discharge status: Home / Self Care | Payer: Medicare Other | Source: Ambulatory Visit | Attending: Physician Assistant | Admitting: Physician Assistant

## 2023-03-18 ENCOUNTER — Encounter (HOSPITAL_COMMUNITY): Payer: Self-pay

## 2023-03-18 DIAGNOSIS — D539 Nutritional anemia, unspecified: Secondary | ICD-10-CM | POA: Diagnosis not present

## 2023-03-18 DIAGNOSIS — Z1379 Encounter for other screening for genetic and chromosomal anomalies: Secondary | ICD-10-CM | POA: Insufficient documentation

## 2023-03-18 DIAGNOSIS — I10 Essential (primary) hypertension: Secondary | ICD-10-CM | POA: Insufficient documentation

## 2023-03-18 DIAGNOSIS — D473 Essential (hemorrhagic) thrombocythemia: Secondary | ICD-10-CM | POA: Diagnosis not present

## 2023-03-18 DIAGNOSIS — Z7984 Long term (current) use of oral hypoglycemic drugs: Secondary | ICD-10-CM | POA: Insufficient documentation

## 2023-03-18 DIAGNOSIS — Z85038 Personal history of other malignant neoplasm of large intestine: Secondary | ICD-10-CM | POA: Diagnosis not present

## 2023-03-18 DIAGNOSIS — D696 Thrombocytopenia, unspecified: Secondary | ICD-10-CM | POA: Diagnosis not present

## 2023-03-18 DIAGNOSIS — D509 Iron deficiency anemia, unspecified: Secondary | ICD-10-CM | POA: Diagnosis not present

## 2023-03-18 DIAGNOSIS — E119 Type 2 diabetes mellitus without complications: Secondary | ICD-10-CM | POA: Diagnosis not present

## 2023-03-18 DIAGNOSIS — D75839 Thrombocytosis, unspecified: Secondary | ICD-10-CM | POA: Diagnosis not present

## 2023-03-18 DIAGNOSIS — I714 Abdominal aortic aneurysm, without rupture, unspecified: Secondary | ICD-10-CM | POA: Insufficient documentation

## 2023-03-18 DIAGNOSIS — D239 Other benign neoplasm of skin, unspecified: Secondary | ICD-10-CM | POA: Diagnosis not present

## 2023-03-18 DIAGNOSIS — Z87891 Personal history of nicotine dependence: Secondary | ICD-10-CM | POA: Insufficient documentation

## 2023-03-18 DIAGNOSIS — D649 Anemia, unspecified: Secondary | ICD-10-CM

## 2023-03-18 HISTORY — PX: IR BONE MARROW BIOPSY & ASPIRATION: IMG5727

## 2023-03-18 LAB — CBC WITH DIFFERENTIAL/PLATELET
Abs Immature Granulocytes: 0.22 10*3/uL — ABNORMAL HIGH (ref 0.00–0.07)
Basophils Absolute: 0.1 10*3/uL (ref 0.0–0.1)
Basophils Relative: 2 %
Eosinophils Absolute: 0 10*3/uL (ref 0.0–0.5)
Eosinophils Relative: 1 %
HCT: 34.4 % — ABNORMAL LOW (ref 39.0–52.0)
Hemoglobin: 10.7 g/dL — ABNORMAL LOW (ref 13.0–17.0)
Immature Granulocytes: 5 %
Lymphocytes Relative: 17 %
Lymphs Abs: 0.8 10*3/uL (ref 0.7–4.0)
MCH: 32.8 pg (ref 26.0–34.0)
MCHC: 31.1 g/dL (ref 30.0–36.0)
MCV: 105.5 fL — ABNORMAL HIGH (ref 80.0–100.0)
Monocytes Absolute: 0.5 10*3/uL (ref 0.1–1.0)
Monocytes Relative: 11 %
Neutro Abs: 3 10*3/uL (ref 1.7–7.7)
Neutrophils Relative %: 64 %
Platelets: 507 10*3/uL — ABNORMAL HIGH (ref 150–400)
RBC: 3.26 MIL/uL — ABNORMAL LOW (ref 4.22–5.81)
RDW: 19.1 % — ABNORMAL HIGH (ref 11.5–15.5)
WBC: 4.6 10*3/uL (ref 4.0–10.5)
nRBC: 0.9 % — ABNORMAL HIGH (ref 0.0–0.2)

## 2023-03-18 LAB — GLUCOSE, CAPILLARY: Glucose-Capillary: 165 mg/dL — ABNORMAL HIGH (ref 70–99)

## 2023-03-18 MED ORDER — LIDOCAINE HCL (PF) 1 % IJ SOLN
INTRAMUSCULAR | Status: AC
  Administered 2023-03-18: 10 mL
  Filled 2023-03-18: qty 30

## 2023-03-18 MED ORDER — FENTANYL CITRATE (PF) 100 MCG/2ML IJ SOLN
INTRAMUSCULAR | Status: AC
  Filled 2023-03-18: qty 2

## 2023-03-18 MED ORDER — LIDOCAINE HCL (PF) 1 % IJ SOLN
INTRAMUSCULAR | Status: AC | PRN
  Administered 2023-03-18: 20 mL

## 2023-03-18 MED ORDER — MIDAZOLAM HCL 2 MG/2ML IJ SOLN
INTRAMUSCULAR | Status: AC | PRN
  Administered 2023-03-18 (×2): 1 mg via INTRAVENOUS

## 2023-03-18 MED ORDER — MIDAZOLAM HCL 2 MG/2ML IJ SOLN
INTRAMUSCULAR | Status: AC
  Filled 2023-03-18: qty 2

## 2023-03-18 MED ORDER — FENTANYL CITRATE (PF) 100 MCG/2ML IJ SOLN
INTRAMUSCULAR | Status: AC | PRN
  Administered 2023-03-18 (×2): 50 ug via INTRAVENOUS

## 2023-03-18 MED ORDER — SODIUM CHLORIDE 0.9 % IV SOLN: INTRAVENOUS | Status: DC

## 2023-03-18 NOTE — H&P (Signed)
Chief Complaint: Patient with history of CALR mutation thrombocytopenia now with worsening anemia. Request is for bone marrow biopsy for further evaluation.   Referring Physician(s): [place] M  Supervising Physician: Michaelle Birks  Patient Status: [facility] - Out-pt  History of Present Illness: Justin Berger is a 72 y.o. male outpatient. History of HTN, DM, colon cancer, AAA, basal cell and iron deficiency anemia, calreticulin gene (CALR) mutation thrombocytopenia. Found to have worsening anemia and elevated NRBC. Team is requesting bone marrow biopsy for further evaluation of his CALR mutation thrombocytopenia. Patient states that he has had a prior bone marrow biopsy not performed by IR.  Currently without any significant complaints. Patient alert and laying in bed,calm. Denies any fevers, headache, chest pain, SOB, cough, abdominal pain, nausea, vomiting or bleeding. Return precautions and treatment recommendations and follow-up discussed with the patient  who is agreeable with the plan.    Past Medical History:  Diagnosis Date   AAA (abdominal aortic aneurysm) without rupture ([place]) 01/2021   Colon cancer ([place]) 2003   Diabetes ([place])    Essential thrombocytosis ([place])    Hypertension    Skin cancer, basal cell    back    Past Surgical History:  Procedure Laterality Date   BIOPSY  12/18/2021   Procedure: BIOPSY;  Surgeon: Harvel Quale, MD;  [facility];  Service: Gastroenterology;;   CATARACT EXTRACTION, BILATERAL     COLON SURGERY  06/23/2002   right colectomy   COLONOSCOPY  08/25/2020   Dr. Adelina Mings, normal colon other than venous lake in Left colon   COLONOSCOPY WITH PROPOFOL N/A 10/23/2021   Procedure: COLONOSCOPY WITH PROPOFOL;  Surgeon: Harvel Quale, MD;  [facility];  Service: Gastroenterology;  Laterality: N/A;  12:30   ESOPHAGEAL BANDING  10/23/2021   Procedure: ESOPHAGEAL BANDING;  Surgeon: Harvel Quale, MD;  [facility];  Service: Gastroenterology;;   ESOPHAGEAL BANDING  12/18/2021   Procedure: ESOPHAGEAL BANDING;  Surgeon: Montez Morita, Quillian Quince, MD;  [facility];  Service: Gastroenterology;;   ESOPHAGEAL BANDING  02/08/2022   Procedure: ESOPHAGEAL BANDING;  Surgeon: Montez Morita, Quillian Quince, MD;  [facility];  Service: Gastroenterology;;   ESOPHAGOGASTRODUODENOSCOPY  05/25/2021   [facility]: moderate inflammation characterized by adherent blood, erythema, and friability in gastric body, antrum and prepyloric region, as well as 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   ESOPHAGOGASTRODUODENOSCOPY (EGD) WITH PROPOFOL N/A 10/23/2021   Procedure: ESOPHAGOGASTRODUODENOSCOPY (EGD) WITH PROPOFOL;  Surgeon: Harvel Quale, MD;  [facility];  Service: Gastroenterology;  Laterality: N/A;   ESOPHAGOGASTRODUODENOSCOPY (EGD) WITH PROPOFOL N/A 12/18/2021   Procedure: ESOPHAGOGASTRODUODENOSCOPY (EGD) WITH PROPOFOL;  Surgeon: Harvel Quale, MD;  [facility];  Service: Gastroenterology;  Laterality: N/A;  8:05   ESOPHAGOGASTRODUODENOSCOPY (EGD) WITH PROPOFOL N/A 02/08/2022   Procedure: ESOPHAGOGASTRODUODENOSCOPY (EGD) WITH PROPOFOL;  Surgeon: Harvel Quale, MD;  [facility];  Service: Gastroenterology;  Laterality: N/A;  1055   ESOPHAGOGASTRODUODENOSCOPY (EGD) WITH PROPOFOL N/A 07/09/2022   Procedure: ESOPHAGOGASTRODUODENOSCOPY (EGD) WITH PROPOFOL;  Surgeon: Harvel Quale, MD;  [facility];  Service: Gastroenterology;  Laterality: N/A;  1130 ASA 2   ESOPHAGOGASTRODUODENOSCOPY (EGD) WITH PROPOFOL N/A 10/17/2022   Procedure: ESOPHAGOGASTRODUODENOSCOPY (EGD) WITH PROPOFOL;  Surgeon: Harvel Quale, MD;  [facility];  Service: Gastroenterology;  Laterality: N/A;  915 ASA 2   HEMOSTASIS CLIP PLACEMENT  02/08/2022  Procedure: HEMOSTASIS CLIP PLACEMENT;  Surgeon: Harvel Quale, MD;  [facility];  Service: Gastroenterology;;   IR TRANSCATHETER BX  12/03/2021   IR US GUIDE VASC ACCESS RIGHT  12/03/2021   POLYPECTOMY  10/23/2021   Procedure: POLYPECTOMY INTESTINAL;  Surgeon: Harvel Quale, MD;  [facility];  Service: Gastroenterology;;   POLYPECTOMY  02/08/2022   Procedure: POLYPECTOMY;  Surgeon: Harvel Quale, MD;  [facility];  Service: Gastroenterology;;   TONSILLECTOMY      Allergies: Ivp dye  [iodinated contrast media]  Medications: Prior to Admission medications   Medication Sig Start Date End Date Taking? Authorizing Provider  ACCU-CHEK GUIDE test strip SMARTSIG:Strip(s) Via Meter Daily 03/07/22   [provider]  Accu-Chek Softclix Lancets lancets USE TO TEST BLOOD SUGAR DAILY 08/23/21   [provider]  acetaminophen (TYLENOL) 500 MG tablet Take 1,000 mg by mouth every 6 (six) hours as needed for moderate pain or headache.    [provider]  allopurinol (ZYLOPRIM) 300 MG tablet Take 300 mg by mouth in the morning. 09/02/20   [provider]  Ascorbic Acid (VITAMIN C PO) Take 2,000 mg by mouth daily as needed (during winter season).    [provider]  aspirin 81 MG EC tablet Take 81 mg by mouth in the morning.    [provider]  atorvastatin (LIPITOR) 10 MG tablet Take 10 mg by mouth every Sunday. 10/30/19   [provider]  Blood Glucose Monitoring Suppl (ACCU-CHEK GUIDE ME) w/Device KIT USE TO TEST FASTING SUGAR ONCE DAILY 08/23/21   [provider]  Cholecalciferol (VITAMIN D3 PO) Take 2 capsules by mouth in the morning.    [provider]  clonazePAM (KLONOPIN) 0.5 MG tablet Take 0.5 mg by mouth daily as needed for anxiety. 02/14/20   [provider]  DULoxetine (CYMBALTA) 60 MG capsule Take 60 mg by mouth in the morning. 04/12/21    [provider]  empagliflozin (JARDIANCE) 25 MG TABS tablet Take by mouth. 02/11/23   [provider]  fluticasone (FLONASE) 50 MCG/ACT nasal spray Place 1 spray into both nostrils daily as needed for allergies or rhinitis.    [provider]  gabapentin (NEURONTIN) 300 MG capsule Take 900 mg by mouth at bedtime. 08/01/20   [provider]  glipiZIDE (GLUCOTROL XL) 10 MG 24 hr tablet Take 10 mg by mouth 2 (two) times daily. 01/17/20   [provider]  hydroxyurea (HYDREA) 500 MG capsule Take 2 capsules (1,000 mg total) by mouth 3 (three) times a week AND 3 capsules (1,500 mg total) 4 (four) times a week. [2 capsules on Tu/Th/Sun, 3 capsules on M/W/F/Sat]. May take with food to minimize GI side effects.. 01/22/23   Harriett Rush, PA-C  loratadine (CLARITIN) 10 MG tablet Take 10 mg by mouth daily as needed for allergies.    [provider]  metFORMIN (GLUCOPHAGE-XR) 500 MG 24 hr tablet Take 1,000 mg by mouth in the morning and at bedtime. 02/23/18   [provider]  metoprolol succinate (TOPROL-XL) 50 MG 24 hr tablet Take 50 mg by mouth every evening. 01/22/22   [provider]  Multiple Vitamin (MULTIVITAMIN WITH MINERALS) TABS tablet Take 1 tablet by mouth in the morning.    [provider]  omeprazole (PRILOSEC) 40 MG capsule Take 1 capsule (40 mg total) by mouth daily. 10/17/22   Harvel Quale, MD  sucralfate (CARAFATE) 1 g tablet Take  1 tablet (1 g total) by mouth 2 (two) times daily. 02/08/22   Harvel Quale, MD  tadalafil (CIALIS) 20 MG tablet Take 20 mg by mouth daily as needed for erectile dysfunction. 07/03/21   [provider]  zinc gluconate 50 MG tablet Take 50 mg by mouth daily as needed (during the winter season).    [provider]     Family History  Problem Relation Age of Onset   Dementia Mother    Cancer Paternal Uncle        lung    Social History    Socioeconomic History   Marital status: Married    Spouse name: Not on file   Number of children: Not on file   Years of education: Not on file   Highest education level: Not on file  Occupational History   Not on file  Tobacco Use   Smoking status: Former    Packs/day: 0.50    Years: 3.00    Additional pack years: 0.00    Total pack years: 1.50    Types: Cigarettes    Quit date: 04/08/1978    Years since quitting: 44.9   Smokeless tobacco: Never  Vaping Use   Vaping Use: Never used  Substance and Sexual Activity   Alcohol use: Never   Drug use: Never   Sexual activity: Not on file  Other Topics Concern   Not on file  Social History Narrative   Not on file   Social Determinants of Health   Financial Resource Strain: Low Risk  (11/29/2020)   Overall Financial Resource Strain (CARDIA)    Difficulty of Paying Living Expenses: Not hard at all  Food Insecurity: No Food Insecurity (11/29/2020)   Hunger Vital Sign    Worried About Running Out of Food in the Last Year: Never true    [place] in the Last Year: Never true  Transportation Needs: No Transportation Needs (11/29/2020)   PRAPARE - Hydrologist (Medical): No    Lack of Transportation (Non-Medical): No  Physical Activity: Inactive (11/29/2020)   Exercise Vital Sign    Days of Exercise per Week: 0 days    Minutes of Exercise per Session: 0 min  Stress: No Stress Concern Present (11/29/2020)   [address]    Feeling of Stress : Not at all  Social Connections: Moderately Integrated (11/29/2020)   Social Connection and Isolation Panel [NHANES]    Frequency of Communication with Friends and Family: More than three times a week    Frequency of Social Gatherings with Friends and Family: Three times a week    Attends Religious Services: 1 to 4 times per year    Active Member of Clubs or Organizations: No    Attends Theatre manager Meetings: Never    Marital Status: Married    Review of Systems: A 12 point ROS discussed and pertinent positives are indicated in the HPI above.  All other systems are negative.  Review of Systems  Constitutional:  Negative for fever.  HENT:  Negative for congestion.   Respiratory:  Negative for cough and shortness of breath.   Cardiovascular:  Negative for chest pain.  Gastrointestinal:  Negative for abdominal pain.  Neurological:  Negative for headaches.  Psychiatric/Behavioral:  Negative for behavioral problems and confusion.     Vital Signs: Wt 236 lb 1.8 oz (107.1 kg)   BMI 33.88 kg/m  Physical Exam Vitals and nursing note reviewed.  Constitutional:      Appearance: He is well-developed. He is obese.  HENT:     Head: Normocephalic.  Cardiovascular:     Rate and Rhythm: Normal rate and regular rhythm.     Heart sounds: Normal heart sounds.  Pulmonary:     Effort: Pulmonary effort is normal.     Breath sounds: Normal breath sounds.  Musculoskeletal:        General: Normal range of motion.     Cervical back: Normal range of motion.  Skin:    General: Skin is warm and dry.  Neurological:     General: No focal deficit present.     Mental Status: He is alert and oriented to person, place, and time.  Psychiatric:        Mood and Affect: Mood normal.        Behavior: Behavior normal.        Thought Content: Thought content normal.        Judgment: Judgment normal.     Imaging: No results found.  Labs:  CBC: Recent Labs    05/10/22 0939 08/22/22 0953 12/02/22 0831 03/04/23 0825  WBC 4.3 4.7 4.2 4.6  HGB 10.4* 11.1* 10.8* 10.2*  HCT 33.4* 35.2* 34.6* 33.1*  PLT 467* 431* 380 410*     COAGS: No results for input(s): "INR", "APTT" in the last 8760 hours.  BMP: Recent Labs    05/10/22 0939 08/22/22 0953 12/02/22 0831 03/04/23 0825  NA 139 138 139 137  K 4.3 4.3 3.9 4.0  CL 109 106 106 105  CO2 22 24 24 22   GLUCOSE 201* 161*  189* 181*  BUN 16 18 12 18   CALCIUM 9.0 9.3 9.0 9.3  CREATININE 1.27* 1.03 1.07 1.22  GFRNONAA >60 >60 >60 >60     LIVER FUNCTION TESTS: Recent Labs    05/10/22 0939 08/22/22 0953 12/02/22 0831 03/04/23 0825  BILITOT 0.5 1.0 0.7 0.9  AST 8* 13* 21 17  ALT 14 17 17 17   ALKPHOS 46 35* 35* 43  PROT 6.5 6.9 6.7 6.6  ALBUMIN 4.3 4.4 4.2 4.3      Assessment and Plan:  72 y.o. male outpatient. History of HTN, DM, colon cancer, AAA, basal cell and iron deficiency anemia, calreticulin gene (CALR) mutation thrombocytopenia. Found to have worsening anemia and elevated NRBC. Team is requesting bone marrow biopsy for further evaluation of his CALR mutation thrombocytopenia. Patient states that he has had a prior bone marrow biopsy not performed by IR.  Labs today pending. Patient is on 81 mg of ASA. All other medications are within acceptable parameters.  Allergies include IVP dye reaction heart stopped. Patient has been NPO since midnight.   Risks and benefits of bone marrow biopsy was discussed with the patient and/or patient's family including, but not limited to bleeding, infection, damage to adjacent structures or low yield requiring additional tests.  All of the questions were answered and there is agreement to proceed.  Consent signed and in chart.   Thank you for this interesting consult.  I greatly enjoyed meeting LANI BEAVERS and look forward to participating in their care.  A copy of this report was sent to the requesting provider on this date.  Electronically Signed: Jacqualine Mau, NP 03/18/2023, 7:45 AM   I spent a total of  30 Minutes   in face to face in clinical consultation, greater than 50% of which was counseling/coordinating care  for bone marrow biopsy

## 2023-03-18 NOTE — Procedures (Signed)
Vascular and Interventional Radiology Procedure Note  Patient: Justin Berger DOB: 1951-04-21 Medical Record Number: [id number] Note Date/Time: 03/18/23 8:51 AM   Performing Physician: Michaelle Birks, MD Assistant(s): None  Diagnosis: Thrombocytopenia  Procedure: BONE MARROW ASPIRATION and BIOPSY  Anesthesia: Conscious Sedation Complications: None Estimated Blood Loss: Minimal Specimens: Sent for Pathology  Findings:  Successful Fluoroscopy-guided bone marrow aspiration and biopsy A total of 2 cores were obtained. Hemostasis of the tract was achieved using Manual Pressure.  Plan: Bed rest for 1 hours.  See detailed procedure note with images in PACS. The patient tolerated the procedure well without incident or complication and was returned to Recovery in stable condition.    Michaelle Birks, MD [facility]   Pager. [phone]

## 2023-03-18 NOTE — Discharge Instructions (Addendum)
Bone Marrow Aspiration and Bone Marrow Biopsy, Adult, Care After  The following information offers guidance on how to care for yourself after your procedure. Your health care provider may also give you more specific instructions. If you have problems or questions, contact your health care provider.  What can I expect after the procedure?  May remove dressing or bandaid and shower tomorrow.  Keep site clean and dry. Replace with clean dressing or bandaid as necessary. Urgent needs IR clinic [phone] (mon-fri 8-5).  After the procedure, it is common to have: Mild pain and tenderness. Swelling. Bruising. Follow these instructions at home: Incision care  Follow instructions from your health care provider about how to take care of the incision site. Make sure you: Wash your hands with soap and water for at least 20 seconds before and after you change your bandage (dressing). If soap and water are not available, use hand sanitizer. Change your dressing as told by your health care provider. Leave stitches (sutures), skin glue, or adhesive strips in place. These skin closures may need to stay in place for 2 weeks or longer. If adhesive strip edges start to loosen and curl up, you may trim the loose edges. Do not remove adhesive strips completely unless your health care provider tells you to do that. Check your incision site every day for signs of infection. Check for: More redness, swelling, or pain. Fluid or blood. Warmth. Pus or a bad smell. Activity Return to your normal activities as told by your health care provider. Ask your health care provider what activities are safe for you. Do not lift anything that is heavier than 10 lb (4.5 kg), or the limit that you are told, until your health care provider says that it is safe. If you were given a sedative during the procedure, it can affect you for  several hours. Do not drive or operate machinery until your health care provider says that it is safe. General instructions  Take over-the-counter and prescription medicines only as told by your health care provider. Do not take baths, swim, or use a hot tub until your health care provider approves. Ask your health care provider if you may take showers. You may only be allowed to take sponge baths. If directed, put ice on the affected area. To do this: Put ice in a plastic bag. Place a towel between your skin and the bag. Leave the ice on for 20 minutes, 2-3 times a day. If your skin turns bright red, remove the ice right away to prevent skin damage. The risk of skin damage is higher if you cannot feel pain, heat, or cold. Contact a health care provider if: You have signs of infection. Your pain is not controlled with medicine. You have cancer, and a temperature of 100.4F (38C) or higher. Get help right away if: You have a temperature of 101F (38.3C) or higher, or as told by your health care provider. You have bleeding from the incision site that cannot be controlled. This information is not intended to replace advice given to you by your health care provider. Make sure you discuss any questions you have with your health care provider. Document Revised: 04/22/2022 Document Reviewed: 04/22/2022 [employer] Patient Education  2023 [employer]                            Moderate Conscious Sedation, Adult, Care After  This sheet gives you information about how to care   for yourself after your procedure. Your health care provider may also give you more specific instructions. If you have problems or questions, contact your health care provider. What can I expect after the procedure? After the procedure, it is common to have: Sleepiness for several hours. Impaired judgment for several hours. Difficulty with balance. Vomiting if you eat too soon. Follow these instructions at home: For  the time period you were told by your health care provider:     Rest. Do not participate in activities where you could fall or become injured. Do not drive or use machinery. Do not drink alcohol. Do not take sleeping pills or medicines that cause drowsiness. Do not make important decisions or sign legal documents. Do not take care of children on your own. Eating and drinking  Follow the diet recommended by your health care provider. Drink enough fluid to keep your urine pale yellow. If you vomit: Drink water, juice, or soup when you can drink without vomiting. Make sure you have little or no nausea before eating solid foods. General instructions Take over-the-counter and prescription medicines only as told by your health care provider. Have a responsible adult stay with you for the time you are told. It is important to have someone help care for you until you are awake and alert. Do not smoke. Keep all follow-up visits as told by your health care provider. This is important. Contact a health care provider if: You are still sleepy or having trouble with balance after 24 hours. You feel light-headed. You keep feeling nauseous or you keep vomiting. You develop a rash. You have a fever. You have redness or swelling around the IV site. Get help right away if: You have trouble breathing. You have new-onset confusion at home. Summary After the procedure, it is common to feel sleepy, have impaired judgment, or feel nauseous if you eat too soon. Rest after you get home. Know the things you should not do after the procedure. Follow the diet recommended by your health care provider and drink enough fluid to keep your urine pale yellow. Get help right away if you have trouble breathing or new-onset confusion at home. This information is not intended to replace advice given to you by your health care provider. Make sure you discuss any questions you have with your health care  provider. Document Revised: 04/14/2020 Document Reviewed: 11/11/2019 [employer] Patient Education  2023 [employer]  

## 2023-03-19 DIAGNOSIS — H26493 Other secondary cataract, bilateral: Secondary | ICD-10-CM | POA: Diagnosis not present

## 2023-03-19 DIAGNOSIS — H40013 Open angle with borderline findings, low risk, bilateral: Secondary | ICD-10-CM | POA: Diagnosis not present

## 2023-03-19 DIAGNOSIS — H34832 Tributary (branch) retinal vein occlusion, left eye, with macular edema: Secondary | ICD-10-CM | POA: Diagnosis not present

## 2023-03-19 DIAGNOSIS — E119 Type 2 diabetes mellitus without complications: Secondary | ICD-10-CM | POA: Diagnosis not present

## 2023-03-20 LAB — SURGICAL PATHOLOGY

## 2023-03-27 ENCOUNTER — Encounter (HOSPITAL_COMMUNITY): Payer: Self-pay

## 2023-03-28 ENCOUNTER — Encounter (HOSPITAL_COMMUNITY): Payer: Self-pay

## 2023-03-30 NOTE — Progress Notes (Signed)
[facility] [address], [place] [address]    Clinic Day:  03/31/2023  Referring physician: Caryl Bis, MD  Patient Care Team: Derek Jack, MD as PCP - General (Hematology) Gala Romney Cristopher Estimable, MD as Consulting Physician (Gastroenterology) Derek Jack, MD as Consulting Physician (Hematology)   ASSESSMENT & PLAN:   Assessment: 1.  Essential thrombocytosis, CALR+ - High risk disease with history of thrombosis (left leg DVT) and age > 69 - Bone marrow biopsy 12/10/2017 by Dr. Delton Coombes at [facility] in [employer] [place], [place] cytometty for leukemia/lymphoma was non-diagnostic Pathology review showed 60% cellularity with significant megakaryocytic hyperplasia with clusters of megakaryocytes; reticulin stain shows slight (Grade 1) reticulin fibrosis, some centered around areas of megakaryocyte clusters Cytogenic analysis showed normal male karyotype 46,XY[20] Noted to be at risk for progression to post essential thrombocythemia related myelofibrosis, but peripheral blood did not show other features of myelofibrosis, such as leukoerythroblastic blood or atypical megakaryocyte morphology - Intermittent bluish discoloration of bilateral palms, nonpainful.  He reports intermittent blurry vision.  Reports worsening daytime "sweating spells" and drenching night sweats. - Hydroxyurea started in December 2018 - current dose 1000 mg TTSun and 1500 mg MWFSat. - He is on aspirin 81 mg daily. - He is tolerating Hydrea well without side effects apart from chronic mild diarrhea which he attributes to his metformin - CT abdomen and pelvis on 09/29/2017 showed splenomegaly with spleen 14.5 cm.  Abdominal ultrasound (11/02/2021): Stable splenomegaly with spleen 14.3 cm.  MRI abdomen (11/20/2021): No splenomegaly noted. - No palpable adenopathy or splenomegaly on exam  - Labs today (03/04/2023): Platelets 410, WBC 4.6/lymphocyte 0.6, NRBC 1.1, hemoglobin  10.2/MCV 106.1 - Goal is platelets <400, but will allow for platelets <500 in the setting of anemia   2.  Macrocytic anemia, history of prior GI blood loss - Labs obtained at [facility] on 04/08/2021 show that the patient had onset of anemia between December 2021 (Hgb 12.0) and April 2022 (Hgb 8.3); patient was symptomatic with severe fatigue at that time - Hemoccult stool x3 were positive.   --  Took iron pills in the past and did well with them.  He has never required IV iron or blood transfusion.   -- EGD (05/25/2021 at [facility]): Moderate inflammation characterized by adherent blood, erythema, and friability in the gastric body, antrum, and prepyloric region, as well as 3 columns of 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 and distal esophagus. -- He has had multiple EGDs with banding of varices since May 2022 - most recently on 07/09/2022 (banding of grade II esophageal varices by Dr. Jenetta Downer) - Most recent EGD (10/17/2022):  Esophageal venous protrusions (grade 2), which per Dr. Jenetta Downer no intervention was performed as it is unclear if the venous abnormalities are congenital and not true esophageal varices; patient was referred for further evaluation at [facility].  - Laboratory work-up otherwise revealed LDH was slightly elevated at 245.  Erythropoietin was  elevated at 67.2.  Reticulocytes 2.9%, with reticulocyte index < 2 indicating hypoproliferation.   Peripheral smear showed macrocytic anemia, neutrophilic left shift, and  thrombocytosis.  SPEP/IFE were normal.  Hemolysis labs unremarkable. - DIFFERENTIAL DIAGNOSIS favors multifactorial anemia due to chronic GI blood loss, myelosuppressive effects of hydroxyurea, and anemia related to chronic disease - Labs today (03/04/2023): Hgb 10.2/MCV 106.1, NRBC 1.1 - Denies any gross signs or symptoms of blood loss such as hematemesis, hematochezia, or melena     - Symptomatic  with ongoing chronic fatigue      3.  Vitamin B12  and folate deficiencies - Folic acid deficiency noted on 02/12/2022 with folate 5.3, normal homocystine - Borderline vitamin B12 noted on 02/12/2022 with B12 287 and normal MMA - Patient taking vitamin B12 500 mcg daily and folic acid [id number] mcg daily  - Most recent labs (12/02/2022) with improved folate 17.4, vitamin B12 836, MMA normal.   4. 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  Muscle weakness - Patient has ongoing muscle weakness which is worst later in the day   Plan: 1.  CALR positive essential thrombocytosis: -Continue hydroxyurea 3 tablets daily on Monday, Wednesday, Friday and Saturday and 2 tablets daily rest of the week. - Reports feeling tired after 2 PM daily.  He has also been feeling wobbly (off balance) for a while.  For the past 2 to 3 months, he reports flushing sensation in the face with redness followed by sweating which happens once a week.  Wobbliness improves on walking. - BMBX: Moderate reticulin fibrosis in addition to small foci of collagen fibrosis. - CBC today shows platelet count 439.  Hematocrit 35.  WBC normal.  Continue same dose of hydroxyurea.  2.  Myelodysplastic syndrome: - We reviewed BMBX (03/18/2023): Results.  Showed coexisting MDS.  Ring sideroblasts more than 15%.  No increased blasts. - Chromosome analysis: 46, XY[15].  MDS FISH panel: Within normal limits. - Recommend serum EPO levels. - Recommend myeloid NGS panel. - If anemia worse, consider Luspatercept. - I will see him back in 4 weeks to discuss NGS panel and further plan of treatment for MDS.  3.  Vitamin B12 and folate deficiencies  - Continue [id number] and folic  acid supplements.  4.  Muscle weakness  - Myasthenia panel was normal.  Orders Placed This Encounter  Procedures   CBC with Differential/Platelet    Standing Status:   Future    Number of Occurrences:   1    Standing Expiration Date:   03/30/2024    Order Specific Question:   Release to patient    Answer:   Immediate   Reticulocytes    Standing Status:   Future    Number of Occurrences:   1    Standing Expiration Date:   03/30/2024      I,Katie Daubenspeck,acting as a scribe for Derek Jack, MD.,have documented all relevant documentation on the behalf of Derek Jack, MD,as directed by  Derek Jack, MD while in the presence of Derek Jack, MD.   I, Derek Jack MD, have reviewed the above documentation for accuracy and completeness, and I agree with the above.   Derek Jack, MD   4/1/20245:42 PM  CHIEF COMPLAINT:   Diagnosis: CALR+ essential thrombocytosis and iron deficiency anemia    Cancer Staging  No matching staging information was found for the patient.   Prior Therapy: none  Current Therapy:  Hydrea (1,000 mg Tu/Th/Su and 1,500 mg on Mo/We/Fr/Sat)    HISTORY OF PRESENT ILLNESS:   Oncology History  Essential thrombocytosis  08/31/2011 Cancer Diagnosis   History of colon cancer, status post resection in 2012, status post adjuvant chemotherapy  with 6 months of 5-FU and leucovorin under the direction of Dr.Karb at [facility]   09/29/2017 Imaging   CT scan of the abdomen done for nonspecific abdominal pain shows incidental splenomegaly  Patient referred to [place] for further workup   10/30/2017 Genetic Testing   CALR mutation positive Jak 2 [id number] Negative   12/10/2017 Bone Marrow Biopsy   60% cellularity with significant megakaryocytic hyperplasia Grade 1 reticulin fibrosis Occasional hypo-lobulated megakaryocytes Flow cytometry with no significant abnormality Chromosome analysis 28, XY   12/10/2017 Initial  Diagnosis   Essential thrombocytosis, CALR positive, high risk disease given his history of thrombosis and age more than 89  Hydroxyurea started on 12/12/2017      INTERVAL HISTORY:   [place] is a 72 y.o. male presenting to clinic today for follow up of CALR+ essential thrombocytosis and iron deficiency anemia. He was last seen by Tarri Abernethy PA-C on 03/04/23.  Since his last visit, he underwent repeat bone marrow biopsy on 03/18/23. Pathology from the bone marrow showed hypercellular bone marrow with a myeloid neoplasm, no definite increase in blastic cells. Flow pathology showed: no significant CD34 positive blastic population; no monoclonal B-cell population or significant T-cell abnormalities. Cytogenetics and FISH analysis were normal. Of note, peripheral blood showed macrocytic anemia, mild neutrophilic left shift, and thrombocytosis.  Today, he states that he is doing well overall. His appetite level is at 85%. His energy level is at 25%.  PAST MEDICAL HISTORY:   Past Medical History: Past Medical History:  Diagnosis Date   AAA (abdominal aortic aneurysm) without rupture ([place]) 01/2021   Colon cancer ([place]) 2003   Diabetes ([place])    Essential thrombocytosis ([place])    Hypertension    Skin cancer, basal cell    back    Surgical History: Past Surgical History:  Procedure Laterality Date   BIOPSY  12/18/2021   Procedure: BIOPSY;  Surgeon: Harvel Quale, MD;  [facility];  Service: Gastroenterology;;   CATARACT EXTRACTION, BILATERAL     COLON SURGERY  06/23/2002   right colectomy   COLONOSCOPY  08/25/2020   Dr. Adelina Mings, normal colon other than venous lake in Left colon   COLONOSCOPY WITH PROPOFOL N/A 10/23/2021   Procedure: COLONOSCOPY WITH PROPOFOL;  Surgeon: Harvel Quale, MD;  [facility];  Service: Gastroenterology;  Laterality: N/A;  12:30   ESOPHAGEAL BANDING  10/23/2021   Procedure: ESOPHAGEAL BANDING;  Surgeon:  Harvel Quale, MD;  [facility];  Service: Gastroenterology;;   ESOPHAGEAL BANDING  12/18/2021   Procedure: ESOPHAGEAL BANDING;  Surgeon: Montez Morita, Quillian Quince, MD;  [facility];  Service: Gastroenterology;;   ESOPHAGEAL BANDING  02/08/2022   Procedure: ESOPHAGEAL BANDING;  Surgeon: Montez Morita, Quillian Quince, MD;  [facility];  Service: Gastroenterology;;   ESOPHAGOGASTRODUODENOSCOPY  05/25/2021   [facility]: moderate inflammation characterized by adherent blood, erythema, and friability in gastric body, antrum and prepyloric region, as well as 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   ESOPHAGOGASTRODUODENOSCOPY (EGD) WITH PROPOFOL N/A 10/23/2021   Procedure: ESOPHAGOGASTRODUODENOSCOPY (EGD) WITH PROPOFOL;  Surgeon: Harvel Quale, MD;  [facility];  Service: Gastroenterology;  Laterality: N/A;   ESOPHAGOGASTRODUODENOSCOPY (EGD) WITH PROPOFOL N/A 12/18/2021   Procedure: ESOPHAGOGASTRODUODENOSCOPY (EGD) WITH PROPOFOL;  Surgeon: Harvel Quale, MD;  [facility];  Service: Gastroenterology;  Laterality: N/A;  8:05   ESOPHAGOGASTRODUODENOSCOPY (EGD) WITH PROPOFOL N/A 02/08/2022   Procedure:  ESOPHAGOGASTRODUODENOSCOPY (EGD) WITH PROPOFOL;  Surgeon: Montez Morita, Quillian Quince, MD;  [facility];  Service: Gastroenterology;  Laterality: N/A;  1055   ESOPHAGOGASTRODUODENOSCOPY (EGD) WITH PROPOFOL N/A 07/09/2022   Procedure: ESOPHAGOGASTRODUODENOSCOPY (EGD) WITH PROPOFOL;  Surgeon: Harvel Quale, MD;  [facility];  Service: Gastroenterology;  Laterality: N/A;  1130 ASA 2   ESOPHAGOGASTRODUODENOSCOPY (EGD) WITH PROPOFOL N/A 10/17/2022   Procedure: ESOPHAGOGASTRODUODENOSCOPY (EGD) WITH PROPOFOL;  Surgeon: Harvel Quale, MD;  [facility];  Service: Gastroenterology;  Laterality: N/A;  915 ASA 2   HEMOSTASIS CLIP PLACEMENT  02/08/2022    Procedure: HEMOSTASIS CLIP PLACEMENT;  Surgeon: Harvel Quale, MD;  [facility];  Service: Gastroenterology;;   IR BONE MARROW BIOPSY & ASPIRATION  03/18/2023   IR TRANSCATHETER BX  12/03/2021   IR US GUIDE VASC ACCESS RIGHT  12/03/2021   POLYPECTOMY  10/23/2021   Procedure: POLYPECTOMY INTESTINAL;  Surgeon: Harvel Quale, MD;  [facility];  Service: Gastroenterology;;   POLYPECTOMY  02/08/2022   Procedure: POLYPECTOMY;  Surgeon: Harvel Quale, MD;  [facility];  Service: Gastroenterology;;   TONSILLECTOMY      Social History: Social History   Socioeconomic History   Marital status: Married    Spouse name: Not on file   Number of children: Not on file   Years of education: Not on file   Highest education level: Not on file  Occupational History   Not on file  Tobacco Use   Smoking status: Former    Packs/day: 0.50    Years: 3.00    Additional pack years: 0.00    Total pack years: 1.50    Types: Cigarettes    Quit date: 04/08/1978    Years since quitting: 45.0   Smokeless tobacco: Never  Vaping Use   Vaping Use: Never used  Substance and Sexual Activity   Alcohol use: Never   Drug use: Never   Sexual activity: Not on file  Other Topics Concern   Not on file  Social History Narrative   Not on file   Social Determinants of Health   Financial Resource Strain: Low Risk  (11/29/2020)   Overall Financial Resource Strain (CARDIA)    Difficulty of Paying Living Expenses: Not hard at all  Food Insecurity: No Food Insecurity (11/29/2020)   Hunger Vital Sign    Worried About Running Out of Food in the Last Year: Never true    Ran Out of Food in the Last Year: Never true  Transportation Needs: No Transportation Needs (11/29/2020)   PRAPARE - Hydrologist (Medical): No    Lack of Transportation (Non-Medical): No  Physical Activity: Inactive (11/29/2020)   Exercise Vital Sign     Days of Exercise per Week: 0 days    Minutes of Exercise per Session: 0 min  Stress: No Stress Concern Present (11/29/2020)   [place]    Feeling of Stress : Not at all  Social Connections: Moderately Integrated (11/29/2020)   Social Connection and Isolation Panel [NHANES]    Frequency of Communication with Friends and Family: More than three times a week    Frequency of Social Gatherings with Friends and Family: Three times a week    Attends Religious Services: 1 to 4 times per year    Active Member of Clubs or Organizations: No    Attends Archivist Meetings: Never  Marital Status: Married  Human resources officer Violence: Not At Risk (11/29/2020)   Humiliation, Afraid, Rape, and Kick questionnaire    Fear of Current or Ex-Partner: No    Emotionally Abused: No    Physically Abused: No    Sexually Abused: No    Family History: Family History  Problem Relation Age of Onset   Dementia Mother    Cancer Paternal Uncle        lung    Current Medications:  Current Outpatient Medications:    ACCU-CHEK GUIDE test strip, SMARTSIG:Strip(s) Via Meter Daily, Disp: , Rfl:    Accu-Chek Softclix Lancets lancets, USE TO TEST BLOOD SUGAR DAILY, Disp: , Rfl:    acetaminophen (TYLENOL) 500 MG tablet, Take 1,000 mg by mouth every 6 (six) hours as needed for moderate pain or headache., Disp: , Rfl:    allopurinol (ZYLOPRIM) 300 MG tablet, Take 300 mg by mouth in the morning., Disp: , Rfl:    Ascorbic Acid (VITAMIN C PO), Take 2,000 mg by mouth daily as needed (during winter season)., Disp: , Rfl:    aspirin 81 MG EC tablet, Take 81 mg by mouth in the morning., Disp: , Rfl:    atorvastatin (LIPITOR) 10 MG tablet, Take 10 mg by mouth every Sunday., Disp: , Rfl:    Blood Glucose Monitoring Suppl (ACCU-CHEK GUIDE ME) w/Device KIT, USE TO TEST FASTING SUGAR ONCE DAILY, Disp: , Rfl:    Cholecalciferol (VITAMIN D3 PO), Take 2  capsules by mouth in the morning., Disp: , Rfl:    clonazePAM (KLONOPIN) 0.5 MG tablet, Take 0.5 mg by mouth daily as needed for anxiety., Disp: , Rfl:    DULoxetine (CYMBALTA) 60 MG capsule, Take 60 mg by mouth in the morning., Disp: , Rfl:    empagliflozin (JARDIANCE) 25 MG TABS tablet, Take by mouth., Disp: , Rfl:    fluticasone (FLONASE) 50 MCG/ACT nasal spray, Place 1 spray into both nostrils daily as needed for allergies or rhinitis., Disp: , Rfl:    gabapentin (NEURONTIN) 300 MG capsule, Take 900 mg by mouth at bedtime., Disp: , Rfl:    glipiZIDE (GLUCOTROL XL) 10 MG 24 hr tablet, Take 10 mg by mouth 2 (two) times daily., Disp: , Rfl:    hydroxyurea (HYDREA) 500 MG capsule, Take 2 capsules (1,000 mg total) by mouth 3 (three) times a week AND 3 capsules (1,500 mg total) 4 (four) times a week. [2 capsules on Tu/Th/Sun, 3 capsules on M/W/F/Sat]. May take with food to minimize GI side effects.., Disp: 204 capsule, Rfl: 3   loratadine (CLARITIN) 10 MG tablet, Take 10 mg by mouth daily as needed for allergies., Disp: , Rfl:    metFORMIN (GLUCOPHAGE-XR) 500 MG 24 hr tablet, Take 1,000 mg by mouth in the morning and at bedtime., Disp: , Rfl: 6   metoprolol succinate (TOPROL-XL) 50 MG 24 hr tablet, Take 50 mg by mouth every evening., Disp: , Rfl:    Multiple Vitamin (MULTIVITAMIN WITH MINERALS) TABS tablet, Take 1 tablet by mouth in the morning., Disp: , Rfl:    omeprazole (PRILOSEC) 40 MG capsule, Take 1 capsule (40 mg total) by mouth daily., Disp: 90 capsule, Rfl: 3   sucralfate (CARAFATE) 1 g tablet, Take 1 tablet (1 g total) by mouth 2 (two) times daily., Disp: 28 tablet, Rfl: 0   tadalafil (CIALIS) 20 MG tablet, Take 20 mg by mouth daily as needed for erectile dysfunction., Disp: , Rfl:    zinc gluconate 50 MG tablet, Take 50  mg by mouth daily as needed (during the winter season)., Disp: , Rfl:    Allergies: Allergies  Allergen Reactions   Ivp Dye  [Iodinated Contrast Media]     Heart stopped      REVIEW OF SYSTEMS:   Review of Systems  Constitutional:  Negative for chills, fatigue and fever.  HENT:   Negative for lump/mass, mouth sores, nosebleeds, sore throat and trouble swallowing.   Eyes:  Negative for eye problems.  Respiratory:  Negative for cough and shortness of breath.   Cardiovascular:  Negative for chest pain, leg swelling and palpitations.  Gastrointestinal:  Positive for diarrhea. Negative for abdominal pain, constipation, nausea and vomiting.  Genitourinary:  Negative for bladder incontinence, difficulty urinating, dysuria, frequency, hematuria and nocturia.   Musculoskeletal:  Negative for arthralgias, back pain, flank pain, myalgias and neck pain.  Skin:  Negative for itching and rash.  Neurological:  Positive for dizziness. Negative for headaches and numbness.  Hematological:  Does not bruise/bleed easily.  Psychiatric/Behavioral:  Positive for depression. Negative for sleep disturbance and suicidal ideas. The patient is not nervous/anxious.   All other systems reviewed and are negative.    VITALS:   Blood pressure 132/83, pulse 71, temperature 98 F (36.7 C), temperature source Oral, resp. rate 18, height 5\' 10"  (1.778 m), weight 236 lb 9.6 oz (107.3 kg), SpO2 98 %.  Wt Readings from Last 3 Encounters:  03/31/23 236 lb 9.6 oz (107.3 kg)  03/18/23 236 lb 1.8 oz (107.1 kg)  03/04/23 236 lb 1.8 oz (107.1 kg)    Body mass index is 33.95 kg/m.  Performance status (ECOG): 1 - Symptomatic but completely ambulatory  PHYSICAL EXAM:   Physical Exam Vitals and nursing note reviewed. Exam conducted with a chaperone present.  Constitutional:      Appearance: Normal appearance.  Cardiovascular:     Rate and Rhythm: Normal rate and regular rhythm.     Pulses: Normal pulses.     Heart sounds: Normal heart sounds.  Pulmonary:     Effort: Pulmonary effort is normal.     Breath sounds: Normal breath sounds.  Abdominal:     Palpations: Abdomen is soft. There  is no hepatomegaly, splenomegaly or mass.     Tenderness: There is no abdominal tenderness.  Musculoskeletal:     Right lower leg: No edema.     Left lower leg: No edema.  Lymphadenopathy:     Cervical: No cervical adenopathy.     Right cervical: No superficial, deep or posterior cervical adenopathy.    Left cervical: No superficial, deep or posterior cervical adenopathy.     Upper Body:     Right upper body: No supraclavicular or axillary adenopathy.     Left upper body: No supraclavicular or axillary adenopathy.  Neurological:     General: No focal deficit present.     Mental Status: He is alert and oriented to person, place, and time.  Psychiatric:        Mood and Affect: Mood normal.        Behavior: Behavior normal.     LABS:      Latest Ref Rng & Units 03/31/2023    3:34 PM 03/18/2023    7:08 AM 03/04/2023    8:25 AM  CBC  WBC 4.0 - 10.5 K/uL 4.6  4.6  4.6   Hemoglobin 13.0 - 17.0 g/dL 10.6  10.7  10.2   Hematocrit 39.0 - 52.0 % 34.8  34.4  33.1   Platelets  150 - 400 K/uL 439  507  410       Latest Ref Rng & Units 03/04/2023    8:25 AM 12/02/2022    8:31 AM 08/22/2022    9:53 AM  CMP  Glucose 70 - 99 mg/dL 181  189  161   BUN 8 - 23 mg/dL 18  12  18    Creatinine 0.61 - 1.24 mg/dL 1.22  1.07  1.03   Sodium 135 - 145 mmol/L 137  139  138   Potassium 3.5 - 5.1 mmol/L 4.0  3.9  4.3   Chloride 98 - 111 mmol/L 105  106  106   CO2 22 - 32 mmol/L 22  24  24    Calcium 8.9 - 10.3 mg/dL 9.3  9.0  9.3   Total Protein 6.5 - 8.1 g/dL 6.6  6.7  6.9   Total Bilirubin 0.3 - 1.2 mg/dL 0.9  0.7  1.0   Alkaline Phos 38 - 126 U/L 43  35  35   AST 15 - 41 U/L 17  21  13    ALT 0 - 44 U/L 17  17  17       No results found for: "CEA1", "CEA" / No results found for: "CEA1", "CEA" No results found for: "PSA1" No results found for: "CAN199" No results found for: "CAN125"  Lab Results  Component Value Date   TOTALPROTELP 5.8 (L) 04/27/2021   TOTALPROTELP 5.7 (L) 04/27/2021   ALBUMINELP  3.6 04/27/2021   A1GS 0.2 04/27/2021   A2GS 0.7 04/27/2021   BETS 0.9 04/27/2021   GAMS 0.4 04/27/2021   MSPIKE Not Observed 04/27/2021   SPEI Comment 04/27/2021   Lab Results  Component Value Date   TIBC 398 03/04/2023   TIBC 399 12/02/2022   TIBC 440 08/22/2022   FERRITIN 114 03/04/2023   FERRITIN 135 12/02/2022   FERRITIN 158 08/22/2022   IRONPCTSAT 28 03/04/2023   IRONPCTSAT 26 12/02/2022   IRONPCTSAT 25 08/22/2022   Lab Results  Component Value Date   LDH 189 12/02/2022   LDH 186 08/22/2022   LDH 183 05/10/2022     STUDIES:   IR BONE MARROW BIOPSY & ASPIRATION  Result Date: 03/18/2023 INDICATION: Thrombocytopenia EXAM: FLUOROSCOPIC GUIDED BONE MARROW BIOPSY AND ASPIRATION MEDICATIONS: 10 mL lidocaine 1% FLUOROSCOPY TIME:  Fluoroscopic dose; 2 mGy ANESTHESIA/SEDATION: Moderate (conscious) sedation was employed during this procedure as administered by the Interventional Radiology RN. A total of Versed 2 mg and Fentanyl 100 mcg was administered intravenously. Moderate Sedation Time: 13 minutes. The patient's level of consciousness and vital signs were monitored continuously by radiology nursing throughout the procedure under my direct supervision. COMPLICATIONS: None immediate. PROCEDURE: Informed consent was obtained from the patient following an explanation of the procedure, risks, benefits and alternatives. The patient understands, agrees and consents for the procedure. All questions were addressed. A time out was performed prior to the initiation of the procedure. The patient was positioned prone on the fluoroscopy table and the posterior aspect of the RIGHT iliac crest was marked fluoroscopically. The operative site was prepped and draped in the usual sterile fashion. Under sterile conditions and local anesthesia, an 11 gauge coaxial bone biopsy needle was advanced into the posterior aspect of the RIGHT iliac marrow space under intermittent fluoroscopic guidance. Multiple  fluoroscopic images were saved procedural documentation purposes. Initially, a bone marrow aspiration was performed. Next, a bone marrow biopsy was obtained with the 11 gauge outer bone marrow device. The 11 gauge coaxial  bone biopsy needle was re-advanced into a slightly different location within the RIGHT iliac marrow space, positioning was confirmed with fluoroscopic imaging and an additional bone marrow biopsy was obtained. Samples were prepared with the cytotechnologist and deemed adequate. The needle was removed and superficial hemostasis was obtained with manual compression. A dressing was applied. The patient tolerated the procedure well without immediate post procedural complication. IMPRESSION: Successful fluoroscopic guided RIGHT iliac bone marrow aspiration and core biopsy. Michaelle Birks, MD [facility] Electronically Signed   By: Michaelle Birks M.D.   On: 03/18/2023 09:50

## 2023-03-31 ENCOUNTER — Inpatient Hospital Stay: Payer: Medicare Other

## 2023-03-31 ENCOUNTER — Inpatient Hospital Stay: Payer: Medicare Other | Attending: Physician Assistant | Admitting: Hematology

## 2023-03-31 ENCOUNTER — Other Ambulatory Visit: Payer: Self-pay | Admitting: *Deleted

## 2023-03-31 VITALS — BP 132/83 | HR 71 | Temp 98.0°F | Resp 18 | Ht 70.0 in | Wt 236.6 lb

## 2023-03-31 DIAGNOSIS — D539 Nutritional anemia, unspecified: Secondary | ICD-10-CM

## 2023-03-31 DIAGNOSIS — Z87891 Personal history of nicotine dependence: Secondary | ICD-10-CM | POA: Diagnosis not present

## 2023-03-31 DIAGNOSIS — E538 Deficiency of other specified B group vitamins: Secondary | ICD-10-CM | POA: Diagnosis not present

## 2023-03-31 DIAGNOSIS — D473 Essential (hemorrhagic) thrombocythemia: Secondary | ICD-10-CM

## 2023-03-31 DIAGNOSIS — M6281 Muscle weakness (generalized): Secondary | ICD-10-CM | POA: Diagnosis not present

## 2023-03-31 DIAGNOSIS — Z801 Family history of malignant neoplasm of trachea, bronchus and lung: Secondary | ICD-10-CM | POA: Diagnosis not present

## 2023-03-31 DIAGNOSIS — D469 Myelodysplastic syndrome, unspecified: Secondary | ICD-10-CM | POA: Diagnosis not present

## 2023-03-31 LAB — CBC WITH DIFFERENTIAL/PLATELET
Abs Immature Granulocytes: 0.25 10*3/uL — ABNORMAL HIGH (ref 0.00–0.07)
Basophils Absolute: 0.1 10*3/uL (ref 0.0–0.1)
Basophils Relative: 1 %
Eosinophils Absolute: 0 10*3/uL (ref 0.0–0.5)
Eosinophils Relative: 1 %
HCT: 34.8 % — ABNORMAL LOW (ref 39.0–52.0)
Hemoglobin: 10.6 g/dL — ABNORMAL LOW (ref 13.0–17.0)
Immature Granulocytes: 5 %
Lymphocytes Relative: 22 %
Lymphs Abs: 1 10*3/uL (ref 0.7–4.0)
MCH: 32.5 pg (ref 26.0–34.0)
MCHC: 30.5 g/dL (ref 30.0–36.0)
MCV: 106.7 fL — ABNORMAL HIGH (ref 80.0–100.0)
Monocytes Absolute: 0.4 10*3/uL (ref 0.1–1.0)
Monocytes Relative: 9 %
Neutro Abs: 2.9 10*3/uL (ref 1.7–7.7)
Neutrophils Relative %: 62 %
Platelets: 439 10*3/uL — ABNORMAL HIGH (ref 150–400)
RBC: 3.26 MIL/uL — ABNORMAL LOW (ref 4.22–5.81)
RDW: 18.8 % — ABNORMAL HIGH (ref 11.5–15.5)
WBC: 4.6 10*3/uL (ref 4.0–10.5)
nRBC: 1.3 % — ABNORMAL HIGH (ref 0.0–0.2)

## 2023-03-31 LAB — RETICULOCYTES
Immature Retic Fract: 34 % — ABNORMAL HIGH (ref 2.3–15.9)
RBC.: 3.15 MIL/uL — ABNORMAL LOW (ref 4.22–5.81)
Retic Count, Absolute: 75.3 10*3/uL (ref 19.0–186.0)
Retic Ct Pct: 2.4 % (ref 0.4–3.1)

## 2023-03-31 NOTE — Patient Instructions (Signed)
[facility]  Discharge Instructions  You were seen and examined today by Dr. Delton Coombes.  Dr. 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  Dr. Delton Coombes is going to do further labs NGS- Next Generation Sequencing to see what may treat these.   Follow-up as scheduled in 4 weeks to discuss lab results.    Thank you for choosing [facility] to provide your oncology and hematology care.   To afford each patient quality time with our provider, please arrive at least 15 minutes before your scheduled appointment time. You may need to reschedule your appointment if you arrive late (10 or more minutes). Arriving late affects you and other patients whose appointments are after yours.  Also, if you miss three or more appointments without notifying the office, you may be dismissed from the clinic at the provider's discretion.    Again, thank you for choosing [facility].  Our hope is that these requests will decrease the amount of time that you wait before being seen by our physicians.   If you have a lab appointment with the [facility] please come in thru the Main Entrance and check in at the main information desk.           _____________________________________________________________  Should you have questions after your visit to [facility], please contact our office at [phone] and follow the prompts.  Our office hours are 8:00 a.m. to 4:30 p.m. Monday - Thursday and 8:00 a.m. to 2:30 p.m. Friday.  Please note that voicemails left after 4:00 p.m. may not be returned until the following business day.  We are closed weekends and all major holidays.  You do have access to a nurse 24-7, just call the main number to the clinic [phone] and do not press any  options, hold on the line and a nurse will answer the phone.    For prescription refill requests, have your pharmacy contact our office and allow 72 hours.    Masks are optional in the cancer centers. If you would like for your care team to wear a mask while they are taking care of you, please let them know. You may have one support person who is at least 73 years old accompany you for your appointments.

## 2023-04-01 ENCOUNTER — Other Ambulatory Visit: Payer: Self-pay

## 2023-04-01 DIAGNOSIS — D473 Essential (hemorrhagic) thrombocythemia: Secondary | ICD-10-CM

## 2023-04-01 LAB — ERYTHROPOIETIN: Erythropoietin: 112.8 m[IU]/mL — ABNORMAL HIGH (ref 2.6–18.5)

## 2023-04-01 MED ORDER — HYDROXYUREA 500 MG PO CAPS: ORAL_CAPSULE | ORAL | 3 refills | Status: DC

## 2023-04-01 NOTE — Telephone Encounter (Signed)
Chart reviewed. Hydrea refilled per last office visit with Dr. Delton Coombes.

## 2023-04-07 IMAGING — XA IR TRANSCATHETER BIOPSY
6 of 8 series · 13 of 24 positions shown · non-contrast
Comparison: MRI abdomen from 11/20/2021

INDICATION: 70-year-old male with history of gastroesophageal varices with
indeterminate presence of hepatic cirrhosis.

EXAM:
FLUOROSCOPIC GUIDED TRANSJUGULAR LIVER BIOPSY
ULTRASOUND GUIDANCE FOR VENOUS ACCESS
TECHNIQUE: Informed written consent was obtained from the patient after a
discussion of the risks, benefits and alternatives to treatment.
Questions regarding the procedure were encouraged and answered. A
timeout was performed prior to the initiation of the procedure.

[Series 1: fl (-) angio · 2 acquisitions, 2 frames shown (1 of 2)]
[im 1/2]
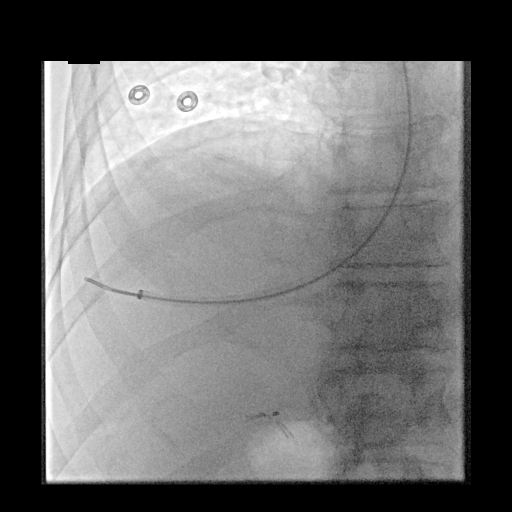
[im 1/2]
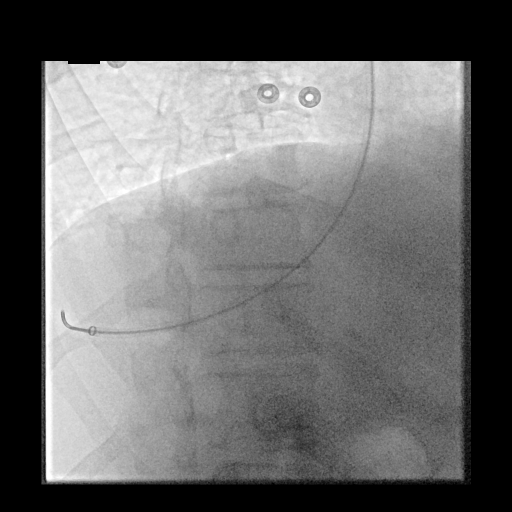

[Series 2: co2 evenflow · 2 acquisitions, 3 frames shown (1 of 4)]
[im 1/2]
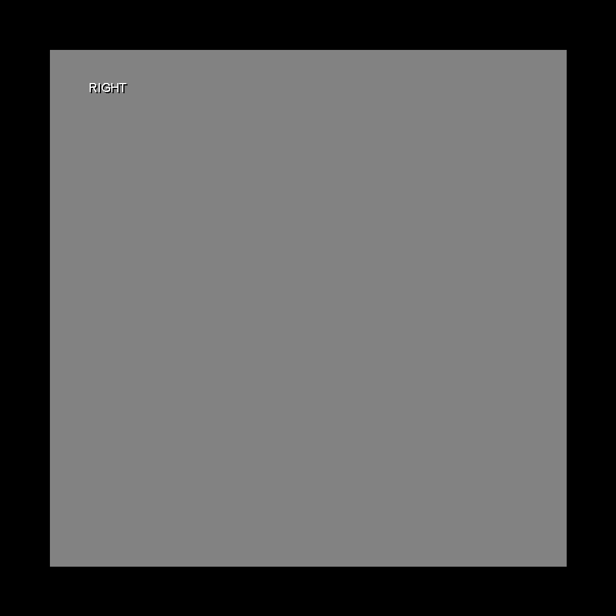
[im 1/2]
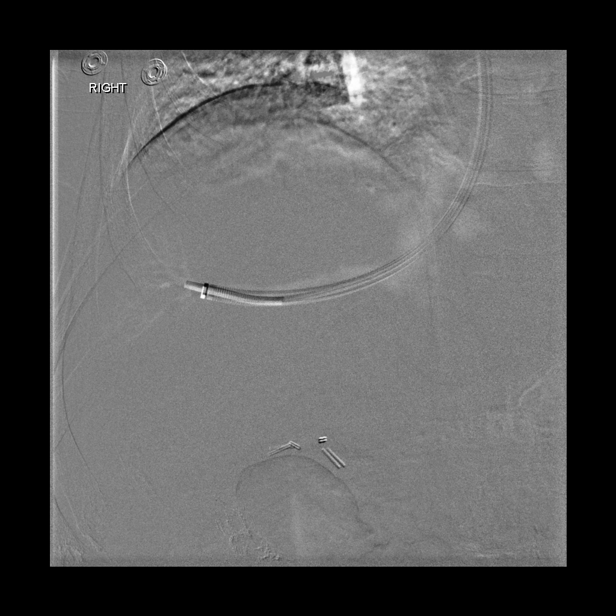
[im 2/2]
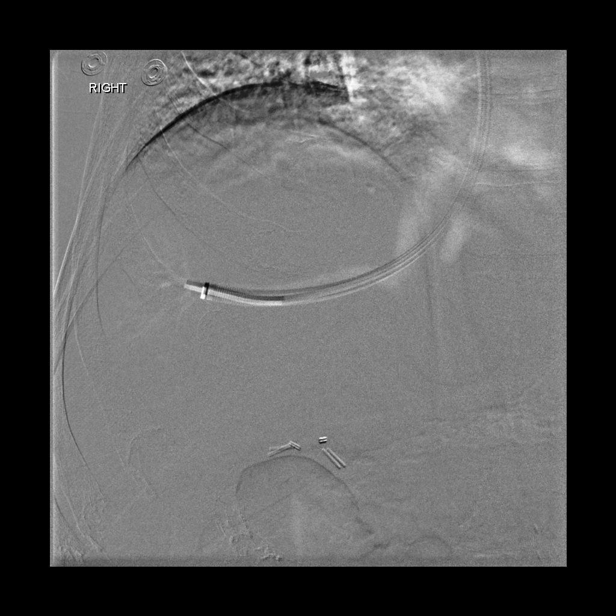

[Series 4: fl (-) angio · 1 of 1 slices shown (2 of 2)]
[im 1/1]
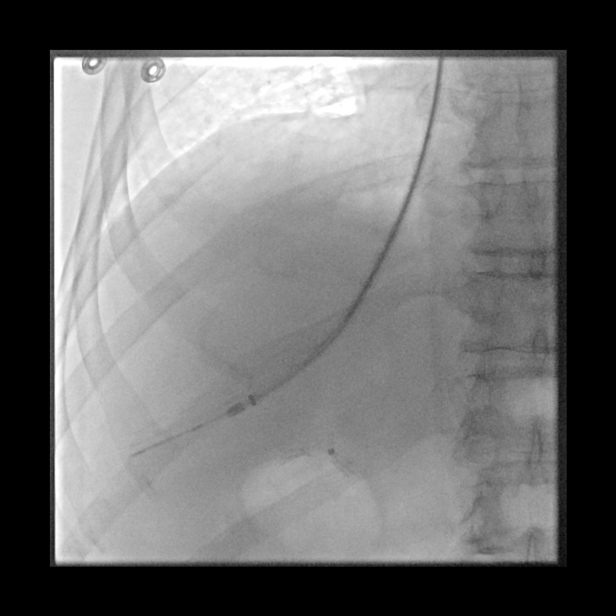

[Series 7: co2 evenflow · 3 of 26 frames shown (2 of 4)]
[frame 1/26]
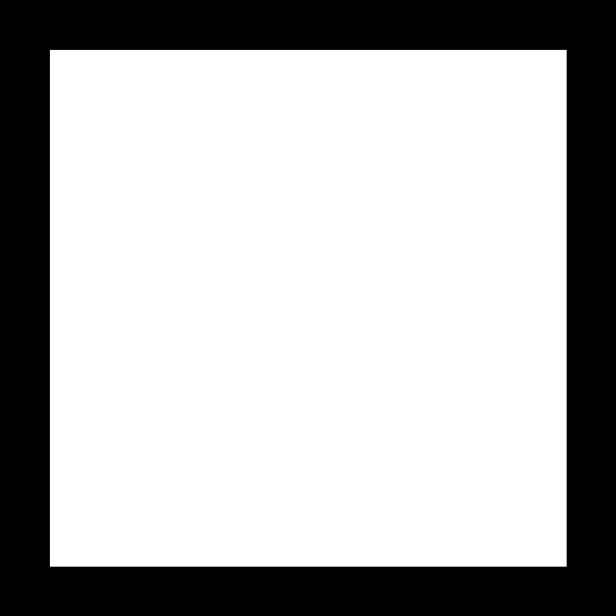
[frame 4/26]
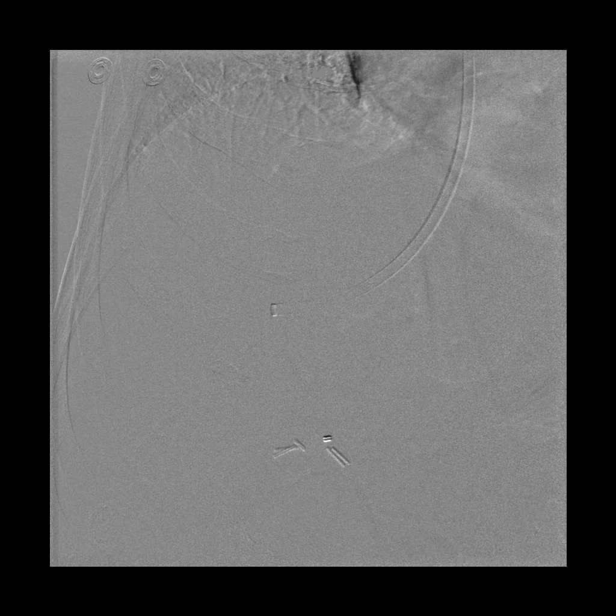
[frame 23/26]
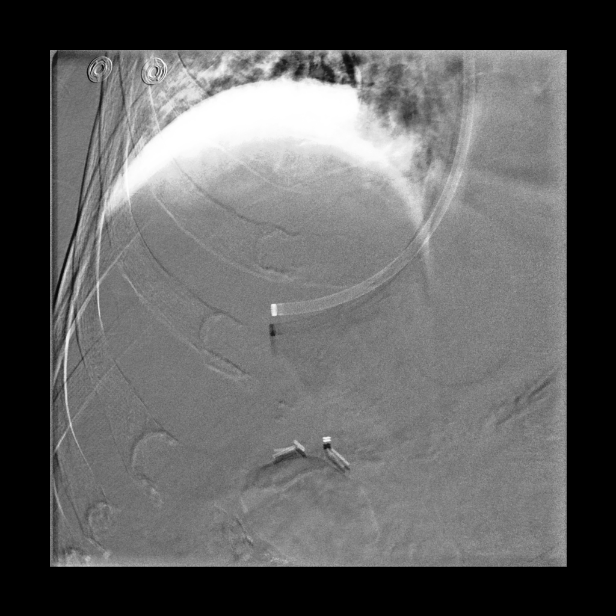

[Series 8: co2 evenflow · 2 of 26 frames shown (3 of 4)]
[frame 14/26]
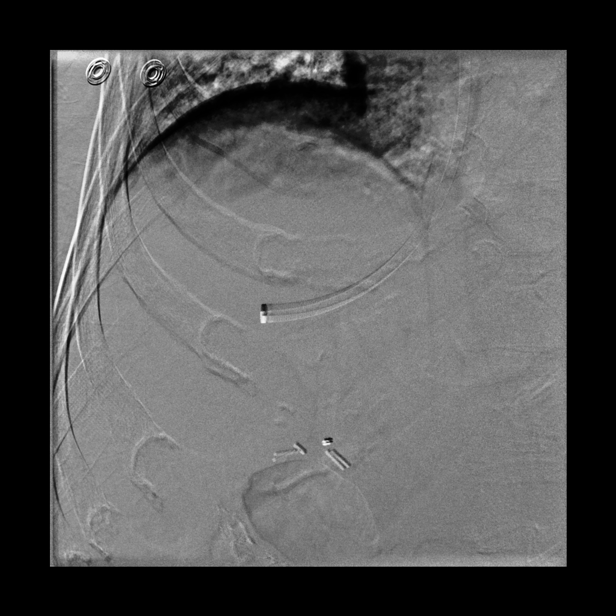
[frame 24/26]
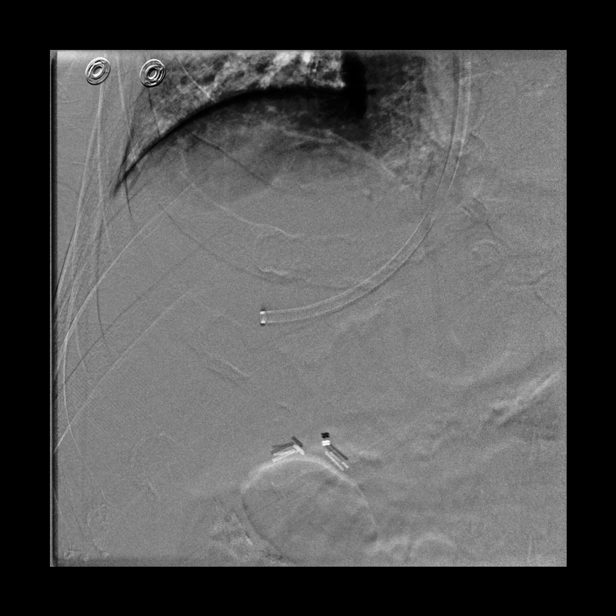

[Series 9: co2 evenflow · 2 acquisitions, 2 frames shown (4 of 4)]
[im 1/2]
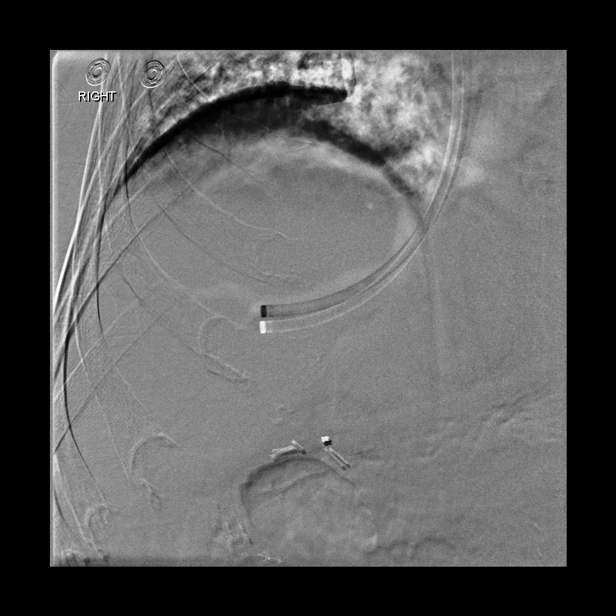
[im 2/2]
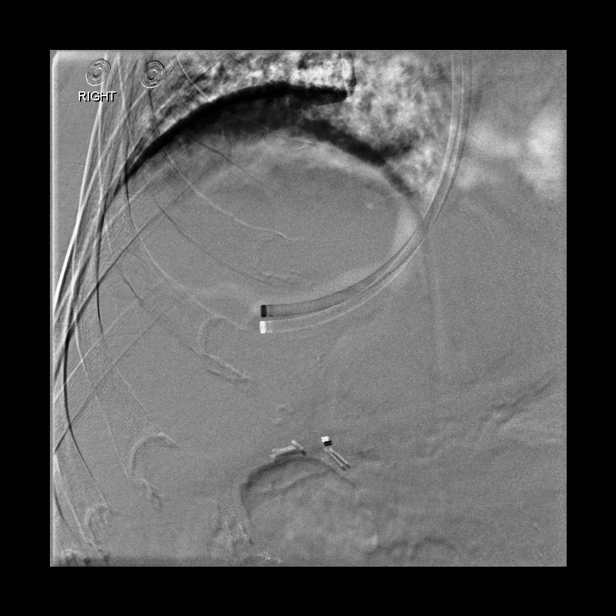

[13 of 24 positions shown; findings below may reference images not displayed]

MEDICATIONS:
None.

CONTRAST:  Approximately 40 cc carbon dioxide, intravenous

ANESTHESIA/SEDATION:
Moderate (conscious) sedation was employed during this procedure. A
total of Versed 2.5 mg and Fentanyl 100 mcg was administered
intravenously.

Moderate Sedation Time: 22 minutes. The patient's level of
consciousness and vital signs were monitored continuously by
radiology nursing throughout the procedure under my direct
supervision.

FLUOROSCOPY TIME:  Three minutes. Twenty seconds. Three hundred
ninety-six mGy

COMPLICATIONS:
None immediate.
The right neck was prepped and draped in the usual sterile fashion,
and a sterile drape was applied covering the operative field.
Maximum barrier sterile technique with sterile gowns and gloves were
used for the procedure. A timeout was performed prior to the
initiation of the procedure. Local anesthesia was provided with 1%
lidocaine with epinephrine.

The right internal jugular vein was accessed with a micropuncture
kit under direct ultrasound guidance. An ultrasound image was saved
for documentation purposes. Through the micropuncture sheath, a
Rosen wire was directed to the inferior vena cava. Serial dilation
was performed and a 10 Fr angle-tipped vascular sheath was placed.
Next to the indwelling wire, a Kumpe catheter was directed into the
right hepatic vein. The Rosen wire was withdrawn and inserted
through the catheter for more peripheral selection of the right
hepatic vein. A right anterior oblique image was obtained to verify
position within the right hepatic vein. The catheter was removed and
exchanged for a Fogarty balloon, which was positioned within the mid
right hepatic vein. The pressure transducer was connected to the
Fogarty catheter and free hepatic vein pressure was obtained. The
balloon was then inflated to occluded the hepatic vein, and wedged
portal pressure was recorded. The sheath was withdrawn to the right
atrium, and right atrial pressure was recorded. The sheath was
re-advanced to the right hepatic vein. The Fogarty was removed and
the transjugular biopsy introducer sheath was inserted. 3 core
needle biopsies were obtained and anterior to the right hepatic vein
under direct fluoroscopic guidance. Samples were deemed adequate,
placed in formalin and submitted to Pathology. Completion hepatic
venogram was performed. The sheath was withdrawn and hemostasis was
achieved at the right neck access site with manual compression. A
dressing was placed. The patient tolerated the procedure well
without immediate postprocedural complication.
FINDINGS: Normal hepatic venogram.  Pressure measurements as follows:

Right atrium

[DATE] (mean 4) mmHg

Free right hepatic

mean 3 mmHg

Wedged hepatic

mean 8 mmHg

Mean Portosystemic Gradient

4 mmHg

Successful acquisition of 3 core needle biopsies anterior to the
right hepatic vein.
IMPRESSION: 1. No portosystemic gradient to suggest portal venous hypertension.
2. Successful fluoroscopic guided transjugular liver biopsy.
3. Normal CO2 hepatic venogram.

## 2023-04-21 LAB — INTELLIGEN MYELOID

## 2023-04-29 NOTE — Progress Notes (Signed)
[name] [address], [place] [address]    Clinic Day:  04/30/2023  Referring physician: Richardean Chimera, MD  Patient Care Team: Doreatha Massed, MD as PCP - General (Hematology) Jena Gauss Gerrit Friends, MD as Consulting Physician (Gastroenterology) Doreatha Massed, MD as Consulting Physician (Hematology)   ASSESSMENT & PLAN:   Assessment: 1.  Essential thrombocytosis, CALR+ - High risk disease with history of thrombosis (left leg DVT) and age > 80 - Bone marrow biopsy 12/10/2017 by Dr. Ellin Saba at [facility] in [place], [place] Flow cytometty for leukemia/lymphoma was non-diagnostic Pathology review showed 60% cellularity with significant megakaryocytic hyperplasia with clusters of megakaryocytes; reticulin stain shows slight (Grade 1) reticulin fibrosis, some centered around areas of megakaryocyte clusters Cytogenic analysis showed normal male karyotype 46,XY[20] Noted to be at risk for progression to post essential thrombocythemia related myelofibrosis, but peripheral blood did not show other features of myelofibrosis, such as leukoerythroblastic blood or atypical megakaryocyte morphology - Intermittent bluish discoloration of bilateral palms, nonpainful.  He reports intermittent blurry vision.  Reports worsening daytime "sweating spells" and drenching night sweats. - Hydroxyurea started in December 2018 - current dose 1000 mg TTSun and 1500 mg MWFSat. - He is on aspirin 81 mg daily. - He is tolerating Hydrea well without side effects apart from chronic mild diarrhea which he attributes to his metformin - CT abdomen and pelvis on 09/29/2017 showed splenomegaly with spleen 14.5 cm.  Abdominal ultrasound (11/02/2021): Stable splenomegaly with spleen 14.3 cm.  MRI abdomen (11/20/2021): No splenomegaly noted. - No palpable adenopathy or splenomegaly on exam  - Labs today (03/04/2023): Platelets 410, WBC 4.6/lymphocyte 0.6, NRBC 1.1, hemoglobin  10.2/MCV 106.1 - Goal is platelets <400, but will allow for platelets <500 in the setting of anemia   2.  Macrocytic anemia, history of prior GI blood loss - Labs obtained at [facility] on 04/08/2021 show that the patient had onset of anemia between December 2021 (Hgb 12.0) and April 2022 (Hgb 8.3); patient was symptomatic with severe fatigue at that time - Hemoccult stool x3 were positive.   --  Took iron pills in the past and did well with them.  He has never required IV iron or blood transfusion.   -- EGD (05/25/2021 at [facility]): Moderate inflammation characterized by adherent blood, erythema, and friability in the gastric body, antrum, and prepyloric region, as well as 3 columns of 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 and distal esophagus. -- He has had multiple EGDs with banding of varices since May 2022 - most recently on 07/09/2022 (banding of grade II esophageal varices by Dr. Levon Hedger) - Most recent EGD (10/17/2022):  Esophageal venous protrusions (grade 2), which per Dr. Levon Hedger no intervention was performed as it is unclear if the venous abnormalities are congenital and not true esophageal varices; patient was referred for further evaluation at [facility].  - Laboratory work-up otherwise revealed LDH was slightly elevated at 245.  Erythropoietin was  elevated at 67.2.  Reticulocytes 2.9%, with reticulocyte index < 2 indicating hypoproliferation.   Peripheral smear showed macrocytic anemia, neutrophilic left shift, and  thrombocytosis.  SPEP/IFE were normal.  Hemolysis labs unremarkable. - DIFFERENTIAL DIAGNOSIS favors multifactorial anemia due to chronic GI blood loss, myelosuppressive effects of hydroxyurea, and anemia related to chronic disease - Labs today (03/04/2023): Hgb 10.2/MCV 106.1, NRBC 1.1 - Denies any gross signs or symptoms of blood loss such as hematemesis, hematochezia, or melena     - Symptomatic  with ongoing chronic fatigue      3.  Vitamin B12  and folate deficiencies - Folic acid deficiency noted on 02/12/2022 with folate 5.3, normal homocystine - Borderline vitamin B12 noted on 02/12/2022 with B12 287 and normal MMA - Patient taking vitamin B12 500 mcg daily and folic acid 400 mcg daily  - Most recent labs (12/02/2022) with improved folate 17.4, vitamin B12 836, MMA normal.   4. 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  Muscle weakness - Patient has ongoing muscle weakness which is worst later in the day    Plan: 1.  CALR positive essential thrombocytosis: -He is taking hydroxyurea 3 tablets daily on Monday, Wednesday, Friday and Saturday and 2 tablets daily rest of the week. - He is reporting severe fatigue. 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  Hence I would cut back on dose of hydroxyurea to 2 tablets daily and see if it improves his fatigue.   2.  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  No increase in blasts. - MDS FISH panel and chromosome analysis were normal. - Serum EPO level: 112.8. - NGS myeloid panel results were discussed. - If anemia gets worse, will consider Luspatercept. - We checked CBC today which showed hemoglobin 10.8.  Ferritin was 96 and percent saturation 22. - I will give him Monoferric 1 g x 1 as he is complaining of severe fatigue. - I will reevaluate him in 6 weeks for follow-up.    3.  Vitamin B12 and folate deficiencies  - Continue B12 and folic acid supplements.      Orders Placed This Encounter  Procedures   CBC with Differential    Standing  Status:   Future    Number of Occurrences:   1    Standing Expiration Date:   04/29/2024   Reticulocytes    Standing Status:   Future    Number of Occurrences:   1    Standing Expiration Date:   04/29/2024   Iron and TIBC ([facility] ONLY)    Standing Status:   Future    Number of Occurrences:   1    Standing Expiration Date:   04/29/2024   Ferritin    Standing Status:   Future    Number of Occurrences:   1    Standing Expiration Date:   04/29/2024   CBC with Differential    Standing Status:   Future    Standing Expiration Date:   04/29/2024   Lactate dehydrogenase    Standing Status:   Future    Standing Expiration Date:   04/29/2024      I,Katie Daubenspeck,acting as a scribe for Doreatha Massed, MD.,have documented all relevant documentation on the behalf of Doreatha Massed, MD,as directed by  Doreatha Massed, MD while in the presence of Doreatha Massed, MD.   I, Doreatha Massed MD, have reviewed the above documentation for accuracy and completeness, and I agree with the above.   Doreatha Massed, MD   5/1/20246:23 PM  CHIEF COMPLAINT:   Diagnosis: CALR+ essential  thrombocytosis and iron deficiency anemia    Cancer Staging  No matching staging information was found for the patient.   Prior Therapy: none  Current Therapy:  Hydrea (1,000 mg Tu/Th/Su and 1,500 mg on Mo/We/Fr/Sat)    HISTORY OF PRESENT ILLNESS:   Oncology History  Essential thrombocytosis (HCC)  08/31/2011 Cancer Diagnosis   History of colon cancer, status post resection in 2012, status post adjuvant chemotherapy with 6 months of 5-FU and leucovorin under the direction of Dr.Karb at [facility]   09/29/2017 Imaging   CT scan of the abdomen done for nonspecific abdominal pain shows incidental splenomegaly  Patient referred to [place] for further workup   10/30/2017 Genetic Testing   CALR mutation positive Jak 2 [id number] Negative   12/10/2017 Bone Marrow Biopsy   60%  cellularity with significant megakaryocytic hyperplasia Grade 1 reticulin fibrosis Occasional hypo-lobulated megakaryocytes Flow cytometry with no significant abnormality Chromosome analysis 46, XY   12/10/2017 Initial Diagnosis   Essential thrombocytosis, CALR positive, high risk disease given his history of thrombosis and age more than 60  Hydroxyurea started on 12/12/2017      INTERVAL HISTORY:   Justin Berger is a 72 y.o. male presenting to clinic today for follow up of CALR+ essential thrombocytosis and iron deficiency anemia. He was last seen by me on 03/31/23.  Today, he states that he is doing well overall. His appetite level is at 100%. His energy level is at 10%.  PAST MEDICAL HISTORY:   Past Medical History: Past Medical History:  Diagnosis Date   AAA (abdominal aortic aneurysm) without rupture (HCC) 01/2021   Colon cancer (HCC) 2003   Diabetes (HCC)    Essential thrombocytosis (HCC)    Hypertension    Skin cancer, basal cell    back    Surgical History: Past Surgical History:  Procedure Laterality Date   BIOPSY  12/18/2021   Procedure: BIOPSY;  Surgeon: Dolores Frame, MD;  [facility];  Service: Gastroenterology;;   CATARACT EXTRACTION, BILATERAL     COLON SURGERY  06/23/2002   right colectomy   COLONOSCOPY  08/25/2020   Dr. Arlyn Dunning, normal colon other than venous lake in Left colon   COLONOSCOPY WITH PROPOFOL N/A 10/23/2021   Procedure: COLONOSCOPY WITH PROPOFOL;  Surgeon: Dolores Frame, MD;  [facility];  Service: Gastroenterology;  Laterality: N/A;  12:30   ESOPHAGEAL BANDING  10/23/2021   Procedure: ESOPHAGEAL BANDING;  Surgeon: Dolores Frame, MD;  [facility];  Service: Gastroenterology;;   ESOPHAGEAL BANDING  12/18/2021   Procedure: ESOPHAGEAL BANDING;  Surgeon: Marguerita Merles, Reuel Boom, MD;  [facility];  Service: Gastroenterology;;   ESOPHAGEAL BANDING  02/08/2022    Procedure: ESOPHAGEAL BANDING;  Surgeon: Marguerita Merles, Reuel Boom, MD;  [facility];  Service: Gastroenterology;;   ESOPHAGOGASTRODUODENOSCOPY  05/25/2021   [facility]: moderate inflammation characterized by adherent blood, erythema, and friability in gastric body, antrum and prepyloric region, as well as 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   ESOPHAGOGASTRODUODENOSCOPY (EGD) WITH PROPOFOL N/A 10/23/2021   Procedure: ESOPHAGOGASTRODUODENOSCOPY (EGD) WITH PROPOFOL;  Surgeon: Dolores Frame, MD;  [facility];  Service: Gastroenterology;  Laterality: N/A;   ESOPHAGOGASTRODUODENOSCOPY (EGD) WITH PROPOFOL N/A 12/18/2021   Procedure: ESOPHAGOGASTRODUODENOSCOPY (EGD) WITH PROPOFOL;  Surgeon: Dolores Frame, MD;  [facility];  Service: Gastroenterology;  Laterality: N/A;  8:05   ESOPHAGOGASTRODUODENOSCOPY (EGD) WITH PROPOFOL N/A 02/08/2022  Procedure: ESOPHAGOGASTRODUODENOSCOPY (EGD) WITH PROPOFOL;  Surgeon: Dolores Frame, MD;  [facility];  Service: Gastroenterology;  Laterality: N/A;  1055   ESOPHAGOGASTRODUODENOSCOPY (EGD) WITH PROPOFOL N/A 07/09/2022   Procedure: ESOPHAGOGASTRODUODENOSCOPY (EGD) WITH PROPOFOL;  Surgeon: Dolores Frame, MD;  [facility];  Service: Gastroenterology;  Laterality: N/A;  1130 ASA 2   ESOPHAGOGASTRODUODENOSCOPY (EGD) WITH PROPOFOL N/A 10/17/2022   Procedure: ESOPHAGOGASTRODUODENOSCOPY (EGD) WITH PROPOFOL;  Surgeon: Dolores Frame, MD;  [facility];  Service: Gastroenterology;  Laterality: N/A;  915 ASA 2   HEMOSTASIS CLIP PLACEMENT  02/08/2022   Procedure: HEMOSTASIS CLIP PLACEMENT;  Surgeon: Dolores Frame, MD;  [facility];  Service: Gastroenterology;;   IR BONE MARROW BIOPSY & ASPIRATION  03/18/2023   IR TRANSCATHETER BX  12/03/2021   IR US GUIDE VASC ACCESS RIGHT  12/03/2021   POLYPECTOMY   10/23/2021   Procedure: POLYPECTOMY INTESTINAL;  Surgeon: Dolores Frame, MD;  [facility];  Service: Gastroenterology;;   POLYPECTOMY  02/08/2022   Procedure: POLYPECTOMY;  Surgeon: Dolores Frame, MD;  [facility];  Service: Gastroenterology;;   TONSILLECTOMY      Social History: Social History   Socioeconomic History   Marital status: Married    Spouse name: Not on file   Number of children: Not on file   Years of education: Not on file   Highest education level: Not on file  Occupational History   Not on file  Tobacco Use   Smoking status: Former    Packs/day: 0.50    Years: 3.00    Additional pack years: 0.00    Total pack years: 1.50    Types: Cigarettes    Quit date: 04/08/1978    Years since quitting: 45.0   Smokeless tobacco: Never  Vaping Use   Vaping Use: Never used  Substance and Sexual Activity   Alcohol use: Never   Drug use: Never   Sexual activity: Not on file  Other Topics Concern   Not on file  Social History Narrative   Not on file   Social Determinants of Health   Financial Resource Strain: Low Risk  (11/29/2020)   Overall Financial Resource Strain (CARDIA)    Difficulty of Paying Living Expenses: Not hard at all  Food Insecurity: No Food Insecurity (11/29/2020)   Hunger Vital Sign    Worried About Running Out of Food in the Last Year: Never true    Ran Out of Food in the Last Year: Never true  Transportation Needs: No Transportation Needs (11/29/2020)   PRAPARE - Administrator, Civil Service (Medical): No    Lack of Transportation (Non-Medical): No  Physical Activity: Inactive (11/29/2020)   Exercise Vital Sign    Days of Exercise per Week: 0 days    Minutes of Exercise per Session: 0 min  Stress: No Stress Concern Present (11/29/2020)   [employer] of Occupational Health - Occupational Stress Questionnaire    Feeling of Stress : Not at all  Social Connections: Moderately  Integrated (11/29/2020)   Social Connection and Isolation Panel [NHANES]    Frequency of Communication with Friends and Family: More than three times a week    Frequency of Social Gatherings with Friends and Family: Three times a week    Attends Religious Services: 1 to 4 times per year    Active Member of Clubs or Organizations: No    Attends Banker Meetings: Never  Marital Status: Married  Catering manager Violence: Not At Risk (11/29/2020)   Humiliation, Afraid, Rape, and Kick questionnaire    Fear of Current or Ex-Partner: No    Emotionally Abused: No    Physically Abused: No    Sexually Abused: No    Family History: Family History  Problem Relation Age of Onset   Dementia Mother    Cancer Paternal Uncle        lung    Current Medications:  Current Outpatient Medications:    ACCU-CHEK GUIDE test strip, SMARTSIG:Strip(s) Via Meter Daily, Disp: , Rfl:    Accu-Chek Softclix Lancets lancets, USE TO TEST BLOOD SUGAR DAILY, Disp: , Rfl:    acetaminophen (TYLENOL) 500 MG tablet, Take 1,000 mg by mouth every 6 (six) hours as needed for moderate pain or headache., Disp: , Rfl:    allopurinol (ZYLOPRIM) 300 MG tablet, Take 300 mg by mouth in the morning., Disp: , Rfl:    Ascorbic Acid (VITAMIN C PO), Take 2,000 mg by mouth daily as needed (during winter season)., Disp: , Rfl:    aspirin 81 MG EC tablet, Take 81 mg by mouth in the morning., Disp: , Rfl:    atorvastatin (LIPITOR) 10 MG tablet, Take 10 mg by mouth every Sunday., Disp: , Rfl:    Blood Glucose Monitoring Suppl (ACCU-CHEK GUIDE ME) w/Device KIT, USE TO TEST FASTING SUGAR ONCE DAILY, Disp: , Rfl:    Cholecalciferol (VITAMIN D3 PO), Take 2 capsules by mouth in the morning., Disp: , Rfl:    clonazePAM (KLONOPIN) 0.5 MG tablet, Take 0.5 mg by mouth daily as needed for anxiety., Disp: , Rfl:    DULoxetine (CYMBALTA) 60 MG capsule, Take 60 mg by mouth in the morning., Disp: , Rfl:    empagliflozin (JARDIANCE) 25 MG  TABS tablet, Take by mouth., Disp: , Rfl:    fluticasone (FLONASE) 50 MCG/ACT nasal spray, Place 1 spray into both nostrils daily as needed for allergies or rhinitis., Disp: , Rfl:    gabapentin (NEURONTIN) 300 MG capsule, Take 900 mg by mouth at bedtime., Disp: , Rfl:    glipiZIDE (GLUCOTROL XL) 10 MG 24 hr tablet, Take 10 mg by mouth 2 (two) times daily., Disp: , Rfl:    hydroxyurea (HYDREA) 500 MG capsule, Take 2 capsules (1,000 mg total) by mouth 3 (three) times a week AND 3 capsules (1,500 mg total) 4 (four) times a week. [2 capsules on Tu/Th/Sun, 3 capsules on M/W/F/Sat]. May take with food to minimize GI side effects.., Disp: 204 capsule, Rfl: 3   loratadine (CLARITIN) 10 MG tablet, Take 10 mg by mouth daily as needed for allergies., Disp: , Rfl:    metFORMIN (GLUCOPHAGE-XR) 500 MG 24 hr tablet, Take 1,000 mg by mouth in the morning and at bedtime., Disp: , Rfl: 6   metoprolol succinate (TOPROL-XL) 50 MG 24 hr tablet, Take 50 mg by mouth every evening., Disp: , Rfl:    Multiple Vitamin (MULTIVITAMIN WITH MINERALS) TABS tablet, Take 1 tablet by mouth in the morning., Disp: , Rfl:    omeprazole (PRILOSEC) 40 MG capsule, Take 1 capsule (40 mg total) by mouth daily., Disp: 90 capsule, Rfl: 3   sucralfate (CARAFATE) 1 g tablet, Take 1 tablet (1 g total) by mouth 2 (two) times daily., Disp: 28 tablet, Rfl: 0   tadalafil (CIALIS) 20 MG tablet, Take 20 mg by mouth daily as needed for erectile dysfunction., Disp: , Rfl:    zinc gluconate 50 MG tablet, Take 50  mg by mouth daily as needed (during the winter season)., Disp: , Rfl:    Allergies: Allergies  Allergen Reactions   Ivp Dye  [Iodinated Contrast Media]     Heart stopped     REVIEW OF SYSTEMS:   Review of Systems  Constitutional:  Negative for chills, fatigue and fever.  HENT:   Negative for lump/mass, mouth sores, nosebleeds, sore throat and trouble swallowing.   Eyes:  Negative for eye problems.  Respiratory:  Negative for cough and  shortness of breath.   Cardiovascular:  Negative for chest pain, leg swelling and palpitations.  Gastrointestinal:  Positive for diarrhea. Negative for abdominal pain, constipation, nausea and vomiting.  Genitourinary:  Negative for bladder incontinence, difficulty urinating, dysuria, frequency, hematuria and nocturia.   Musculoskeletal:  Negative for arthralgias, back pain, flank pain, myalgias and neck pain.  Skin:  Negative for itching and rash.  Neurological:  Positive for dizziness and numbness. Negative for headaches.  Hematological:  Does not bruise/bleed easily.  Psychiatric/Behavioral:  Negative for depression, sleep disturbance and suicidal ideas. The patient is not nervous/anxious.   All other systems reviewed and are negative.    VITALS:   Blood pressure 111/75, pulse 74, temperature 98.2 F (36.8 C), temperature source Oral, resp. rate 16, weight 235 lb 14.3 oz (107 kg), SpO2 99 %.  Wt Readings from Last 3 Encounters:  04/30/23 235 lb 14.3 oz (107 kg)  03/31/23 236 lb 9.6 oz (107.3 kg)  03/18/23 236 lb 1.8 oz (107.1 kg)    Body mass index is 33.85 kg/m.  Performance status (ECOG): 1 - Symptomatic but completely ambulatory  PHYSICAL EXAM:   Physical Exam Vitals and nursing note reviewed. Exam conducted with a chaperone present.  Constitutional:      Appearance: Normal appearance.  Cardiovascular:     Rate and Rhythm: Normal rate and regular rhythm.     Pulses: Normal pulses.     Heart sounds: Normal heart sounds.  Pulmonary:     Effort: Pulmonary effort is normal.     Breath sounds: Normal breath sounds.  Abdominal:     Palpations: Abdomen is soft. There is no hepatomegaly, splenomegaly or mass.     Tenderness: There is no abdominal tenderness.  Musculoskeletal:     Right lower leg: No edema.     Left lower leg: No edema.  Lymphadenopathy:     Cervical: No cervical adenopathy.     Right cervical: No superficial, deep or posterior cervical adenopathy.     Left cervical: No superficial, deep or posterior cervical adenopathy.     Upper Body:     Right upper body: No supraclavicular or axillary adenopathy.     Left upper body: No supraclavicular or axillary adenopathy.  Neurological:     General: No focal deficit present.     Mental Status: He is alert and oriented to person, place, and time.  Psychiatric:        Mood and Affect: Mood normal.        Behavior: Behavior normal.     LABS:      Latest Ref Rng & Units 04/30/2023   12:13 PM 03/31/2023    3:34 PM 03/18/2023    7:08 AM  CBC  WBC 4.0 - 10.5 K/uL 4.1  4.6  4.6   Hemoglobin 13.0 - 17.0 g/dL 16.1  09.6  04.5   Hematocrit 39.0 - 52.0 % 34.9  34.8  34.4   Platelets 150 - 400 K/uL 440  439  507       Latest Ref Rng & Units 03/04/2023    8:25 AM 12/02/2022    8:31 AM 08/22/2022    9:53 AM  CMP  Glucose 70 - 99 mg/dL 161  096  045   BUN 8 - 23 mg/dL 18  12  18    Creatinine 0.61 - 1.24 mg/dL 4.09  8.11  9.14   Sodium 135 - 145 mmol/L 137  139  138   Potassium 3.5 - 5.1 mmol/L 4.0  3.9  4.3   Chloride 98 - 111 mmol/L 105  106  106   CO2 22 - 32 mmol/L 22  24  24    Calcium 8.9 - 10.3 mg/dL 9.3  9.0  9.3   Total Protein 6.5 - 8.1 g/dL 6.6  6.7  6.9   Total Bilirubin 0.3 - 1.2 mg/dL 0.9  0.7  1.0   Alkaline Phos 38 - 126 U/L 43  35  35   AST 15 - 41 U/L 17  21  13    ALT 0 - 44 U/L 17  17  17       No results found for: "CEA1", "CEA" / No results found for: "CEA1", "CEA" No results found for: "PSA1" No results found for: "CAN199" No results found for: "CAN125"  Lab Results  Component Value Date   TOTALPROTELP 5.8 (L) 04/27/2021   TOTALPROTELP 5.7 (L) 04/27/2021   ALBUMINELP 3.6 04/27/2021   A1GS 0.2 04/27/2021   A2GS 0.7 04/27/2021   BETS 0.9 04/27/2021   GAMS 0.4 04/27/2021   MSPIKE Not Observed 04/27/2021   SPEI Comment 04/27/2021   Lab Results  Component Value Date   TIBC 425 04/30/2023   TIBC 398 03/04/2023   TIBC 399 12/02/2022   FERRITIN 96 04/30/2023    FERRITIN 114 03/04/2023   FERRITIN 135 12/02/2022   IRONPCTSAT 22 04/30/2023   IRONPCTSAT 28 03/04/2023   IRONPCTSAT 26 12/02/2022   Lab Results  Component Value Date   LDH 172 04/30/2023   LDH 189 12/02/2022   LDH 186 08/22/2022     STUDIES:   No results found.

## 2023-04-30 ENCOUNTER — Inpatient Hospital Stay: Payer: Medicare Other

## 2023-04-30 ENCOUNTER — Inpatient Hospital Stay: Payer: Medicare Other | Attending: Physician Assistant | Admitting: Hematology

## 2023-04-30 VITALS — BP 111/75 | HR 74 | Temp 98.2°F | Resp 16 | Wt 235.9 lb

## 2023-04-30 DIAGNOSIS — D469 Myelodysplastic syndrome, unspecified: Secondary | ICD-10-CM | POA: Diagnosis not present

## 2023-04-30 DIAGNOSIS — D500 Iron deficiency anemia secondary to blood loss (chronic): Secondary | ICD-10-CM

## 2023-04-30 DIAGNOSIS — E538 Deficiency of other specified B group vitamins: Secondary | ICD-10-CM

## 2023-04-30 DIAGNOSIS — D473 Essential (hemorrhagic) thrombocythemia: Secondary | ICD-10-CM | POA: Diagnosis not present

## 2023-04-30 DIAGNOSIS — D539 Nutritional anemia, unspecified: Secondary | ICD-10-CM

## 2023-04-30 DIAGNOSIS — D509 Iron deficiency anemia, unspecified: Secondary | ICD-10-CM | POA: Insufficient documentation

## 2023-04-30 LAB — RETICULOCYTES
Immature Retic Fract: 30.3 % — ABNORMAL HIGH (ref 2.3–15.9)
RBC.: 3.23 MIL/uL — ABNORMAL LOW (ref 4.22–5.81)
Retic Count, Absolute: 80.8 10*3/uL (ref 19.0–186.0)
Retic Ct Pct: 2.5 % (ref 0.4–3.1)

## 2023-04-30 LAB — CBC WITH DIFFERENTIAL/PLATELET
Abs Immature Granulocytes: 0.21 10*3/uL — ABNORMAL HIGH (ref 0.00–0.07)
Basophils Absolute: 0.1 10*3/uL (ref 0.0–0.1)
Basophils Relative: 2 %
Eosinophils Absolute: 0 10*3/uL (ref 0.0–0.5)
Eosinophils Relative: 1 %
HCT: 34.9 % — ABNORMAL LOW (ref 39.0–52.0)
Hemoglobin: 10.8 g/dL — ABNORMAL LOW (ref 13.0–17.0)
Immature Granulocytes: 5 %
Lymphocytes Relative: 23 %
Lymphs Abs: 0.9 10*3/uL (ref 0.7–4.0)
MCH: 33.1 pg (ref 26.0–34.0)
MCHC: 30.9 g/dL (ref 30.0–36.0)
MCV: 107.1 fL — ABNORMAL HIGH (ref 80.0–100.0)
Monocytes Absolute: 0.4 10*3/uL (ref 0.1–1.0)
Monocytes Relative: 10 %
Neutro Abs: 2.5 10*3/uL (ref 1.7–7.7)
Neutrophils Relative %: 59 %
Platelets: 440 10*3/uL — ABNORMAL HIGH (ref 150–400)
RBC: 3.26 MIL/uL — ABNORMAL LOW (ref 4.22–5.81)
RDW: 19.3 % — ABNORMAL HIGH (ref 11.5–15.5)
WBC: 4.1 10*3/uL (ref 4.0–10.5)
nRBC: 0.7 % — ABNORMAL HIGH (ref 0.0–0.2)

## 2023-04-30 LAB — IRON AND TIBC
Iron: 95 ug/dL (ref 45–182)
Saturation Ratios: 22 % (ref 17.9–39.5)
TIBC: 425 ug/dL (ref 250–450)
UIBC: 330 ug/dL

## 2023-04-30 LAB — FERRITIN: Ferritin: 96 ng/mL (ref 24–336)

## 2023-04-30 LAB — LACTATE DEHYDROGENASE: LDH: 172 U/L (ref 98–192)

## 2023-04-30 NOTE — Patient Instructions (Addendum)
[facility] at [facility] Discharge Instructions   You were seen and examined today by Dr. Ellin Saba.  He reviewed the results of your lab work that was done 4 weeks ago. The special test that we did called IntelliGEN myeloid panel shows you have a condition called MDS (myelodysplastic syndrome). All other results were normal/stable.   We will repeat lab work today. Dr. Kirtland Bouchard discussed with you starting on a shot called Reblozyl. This is given every 3 weeks in the clinic. This is given to help improve your hemoglobin.   Cut back on your Hydrea to 2 pills a day.   Return as scheduled.    Thank you for choosing [facility] at [facility] to provide your oncology and hematology care.  To afford each patient quality time with our provider, please arrive at least 15 minutes before your scheduled appointment time.   If you have a lab appointment with the Cancer Center please come in thru the Main Entrance and check in at the main information desk.  You need to re-schedule your appointment should you arrive 10 or more minutes late.  We strive to give you quality time with our providers, and arriving late affects you and other patients whose appointments are after yours.  Also, if you no show three or more times for appointments you may be dismissed from the clinic at the providers discretion.     Again, thank you for choosing [facility].  Our hope is that these requests will decrease the amount of time that you wait before being seen by our physicians.       _____________________________________________________________  Should you have questions after your visit to [facility], please contact our office at [phone] and follow the prompts.  Our office hours are 8:00 a.m. and 4:30 p.m. Monday - Friday.  Please note that voicemails left after 4:00 p.m. may not be returned until the following business day.  We are closed weekends  and major holidays.  You do have access to a nurse 24-7, just call the main number to the clinic [phone] and do not press any options, hold on the line and a nurse will answer the phone.    For prescription refill requests, have your pharmacy contact our office and allow 72 hours.    Due to Covid, you will need to wear a mask upon entering the hospital. If you do not have a mask, a mask will be given to you at the Main Entrance upon arrival. For doctor visits, patients may have 1 support person age 55 or older with them. For treatment visits, patients can not have anyone with them due to social distancing guidelines and our immunocompromised population.

## 2023-05-05 ENCOUNTER — Other Ambulatory Visit: Payer: Medicare Other

## 2023-05-05 ENCOUNTER — Ambulatory Visit: Payer: Medicare Other | Admitting: Hematology

## 2023-05-19 ENCOUNTER — Inpatient Hospital Stay: Payer: Medicare Other

## 2023-05-19 VITALS — BP 122/72 | HR 66 | Temp 97.7°F | Resp 18 | Wt 235.4 lb

## 2023-05-19 DIAGNOSIS — D469 Myelodysplastic syndrome, unspecified: Secondary | ICD-10-CM | POA: Diagnosis not present

## 2023-05-19 DIAGNOSIS — D500 Iron deficiency anemia secondary to blood loss (chronic): Secondary | ICD-10-CM

## 2023-05-19 MED ORDER — FAMOTIDINE IN NACL 20-0.9 MG/50ML-% IV SOLN
20.0000 mg | Freq: Once | INTRAVENOUS | Status: AC
  Administered 2023-05-19: 20 mg via INTRAVENOUS
  Filled 2023-05-19: qty 50

## 2023-05-19 MED ORDER — SODIUM CHLORIDE 0.9 % IV SOLN
1000.0000 mg | Freq: Once | INTRAVENOUS | Status: AC
  Administered 2023-05-19: 1000 mg via INTRAVENOUS
  Filled 2023-05-19: qty 10

## 2023-05-19 MED ORDER — SODIUM CHLORIDE 0.9 % IV SOLN: Freq: Once | INTRAVENOUS | Status: AC

## 2023-05-19 MED ORDER — METHYLPREDNISOLONE SODIUM SUCC 125 MG IJ SOLR
125.0000 mg | Freq: Once | INTRAMUSCULAR | Status: AC
  Administered 2023-05-19: 125 mg via INTRAVENOUS
  Filled 2023-05-19: qty 2

## 2023-05-19 MED ORDER — CETIRIZINE HCL 10 MG/ML IV SOLN
10.0000 mg | Freq: Once | INTRAVENOUS | Status: AC
  Administered 2023-05-19: 10 mg via INTRAVENOUS
  Filled 2023-05-19: qty 1

## 2023-05-19 NOTE — Patient Instructions (Signed)

## 2023-05-23 DIAGNOSIS — E559 Vitamin D deficiency, unspecified: Secondary | ICD-10-CM | POA: Diagnosis not present

## 2023-05-23 DIAGNOSIS — D519 Vitamin B12 deficiency anemia, unspecified: Secondary | ICD-10-CM | POA: Diagnosis not present

## 2023-05-23 DIAGNOSIS — D649 Anemia, unspecified: Secondary | ICD-10-CM | POA: Diagnosis not present

## 2023-05-23 DIAGNOSIS — Z1329 Encounter for screening for other suspected endocrine disorder: Secondary | ICD-10-CM | POA: Diagnosis not present

## 2023-05-23 DIAGNOSIS — E1165 Type 2 diabetes mellitus with hyperglycemia: Secondary | ICD-10-CM | POA: Diagnosis not present

## 2023-05-23 DIAGNOSIS — E7849 Other hyperlipidemia: Secondary | ICD-10-CM | POA: Diagnosis not present

## 2023-05-23 DIAGNOSIS — D529 Folate deficiency anemia, unspecified: Secondary | ICD-10-CM | POA: Diagnosis not present

## 2023-05-29 DIAGNOSIS — G6289 Other specified polyneuropathies: Secondary | ICD-10-CM | POA: Diagnosis not present

## 2023-05-29 DIAGNOSIS — E7849 Other hyperlipidemia: Secondary | ICD-10-CM | POA: Diagnosis not present

## 2023-05-29 DIAGNOSIS — G252 Other specified forms of tremor: Secondary | ICD-10-CM | POA: Diagnosis not present

## 2023-05-29 DIAGNOSIS — D473 Essential (hemorrhagic) thrombocythemia: Secondary | ICD-10-CM | POA: Diagnosis not present

## 2023-05-29 DIAGNOSIS — E1142 Type 2 diabetes mellitus with diabetic polyneuropathy: Secondary | ICD-10-CM | POA: Diagnosis not present

## 2023-05-29 DIAGNOSIS — D469 Myelodysplastic syndrome, unspecified: Secondary | ICD-10-CM | POA: Diagnosis not present

## 2023-05-29 DIAGNOSIS — M109 Gout, unspecified: Secondary | ICD-10-CM | POA: Diagnosis not present

## 2023-05-29 DIAGNOSIS — I10 Essential (primary) hypertension: Secondary | ICD-10-CM | POA: Diagnosis not present

## 2023-05-29 DIAGNOSIS — Z6835 Body mass index (BMI) 35.0-35.9, adult: Secondary | ICD-10-CM | POA: Diagnosis not present

## 2023-05-29 DIAGNOSIS — F331 Major depressive disorder, recurrent, moderate: Secondary | ICD-10-CM | POA: Diagnosis not present

## 2023-06-06 DIAGNOSIS — H26493 Other secondary cataract, bilateral: Secondary | ICD-10-CM | POA: Diagnosis not present

## 2023-06-06 DIAGNOSIS — H40013 Open angle with borderline findings, low risk, bilateral: Secondary | ICD-10-CM | POA: Diagnosis not present

## 2023-06-06 DIAGNOSIS — E119 Type 2 diabetes mellitus without complications: Secondary | ICD-10-CM | POA: Diagnosis not present

## 2023-06-06 DIAGNOSIS — H34832 Tributary (branch) retinal vein occlusion, left eye, with macular edema: Secondary | ICD-10-CM | POA: Diagnosis not present

## 2023-06-11 ENCOUNTER — Other Ambulatory Visit: Payer: Medicare Other

## 2023-06-11 ENCOUNTER — Ambulatory Visit: Payer: Medicare Other | Admitting: Hematology

## 2023-06-11 DIAGNOSIS — H40013 Open angle with borderline findings, low risk, bilateral: Secondary | ICD-10-CM | POA: Diagnosis not present

## 2023-06-11 DIAGNOSIS — H26493 Other secondary cataract, bilateral: Secondary | ICD-10-CM | POA: Diagnosis not present

## 2023-06-11 DIAGNOSIS — E119 Type 2 diabetes mellitus without complications: Secondary | ICD-10-CM | POA: Diagnosis not present

## 2023-06-11 DIAGNOSIS — H34832 Tributary (branch) retinal vein occlusion, left eye, with macular edema: Secondary | ICD-10-CM | POA: Diagnosis not present

## 2023-06-15 NOTE — Progress Notes (Signed)
[facility] [address], [place] [address]    Clinic Day:  06/16/2023  Referring physician: Doreatha Massed, MD  Patient Care Team: Doreatha Massed, MD as PCP - General (Hematology) Jena Gauss Gerrit Friends, MD as Consulting Physician (Gastroenterology) Doreatha Massed, MD as Consulting Physician (Hematology)   ASSESSMENT & PLAN:   Assessment: 1.  Essential thrombocytosis, CALR+ - High risk disease with history of thrombosis (left leg DVT) and age > 36 - Bone marrow biopsy 12/10/2017 by Dr. Ellin Saba at [facility] in [place], [place] Flow cytometty for leukemia/lymphoma was non-diagnostic Pathology review showed 60% cellularity with significant megakaryocytic hyperplasia with clusters of megakaryocytes; reticulin stain shows slight (Grade 1) reticulin fibrosis, some centered around areas of megakaryocyte clusters Cytogenic analysis showed normal male karyotype 46,XY[20] Noted to be at risk for progression to post essential thrombocythemia related myelofibrosis, but peripheral blood did not show other features of myelofibrosis, such as leukoerythroblastic blood or atypical megakaryocyte morphology - Intermittent bluish discoloration of bilateral palms, nonpainful.  He reports intermittent blurry vision.  Reports worsening daytime "sweating spells" and drenching night sweats. - Hydroxyurea started in December 2018 - current dose 1000 mg TTSun and 1500 mg MWFSat. - He is on aspirin 81 mg daily. - He is tolerating Hydrea well without side effects apart from chronic mild diarrhea which he attributes to his metformin - CT abdomen and pelvis on 09/29/2017 showed splenomegaly with spleen 14.5 cm.  Abdominal ultrasound (11/02/2021): Stable splenomegaly with spleen 14.3 cm.  MRI abdomen (11/20/2021): No splenomegaly noted. - No palpable adenopathy or splenomegaly on exam  - Labs today (03/04/2023): Platelets 410, WBC 4.6/lymphocyte 0.6, NRBC 1.1, hemoglobin  10.2/MCV 106.1 - Goal is platelets <400, but will allow for platelets <500 in the setting of anemia   2.  Macrocytic anemia, history of prior GI blood loss - Labs obtained at [facility] on 04/08/2021 show that the patient had onset of anemia between December 2021 (Hgb 12.0) and April 2022 (Hgb 8.3); patient was symptomatic with severe fatigue at that time - Hemoccult stool x3 were positive.   --  Took iron pills in the past and did well with them.  He has never required IV iron or blood transfusion.   -- EGD (05/25/2021 at [facility]): Moderate inflammation characterized by adherent blood, erythema, and friability in the gastric body, antrum, and prepyloric region, as well as 3 columns of 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 and distal esophagus. -- He has had multiple EGDs with banding of varices since May 2022 - most recently on 07/09/2022 (banding of grade II esophageal varices by Dr. Levon Hedger) - Most recent EGD (10/17/2022):  Esophageal venous protrusions (grade 2), which per Dr. Levon Hedger no intervention was performed as it is unclear if the venous abnormalities are congenital and not true esophageal varices; patient was referred for further evaluation at [facility].  - Laboratory work-up otherwise revealed LDH was slightly elevated at 245.  Erythropoietin was  elevated at 67.2.  Reticulocytes 2.9%, with reticulocyte index < 2 indicating hypoproliferation.   Peripheral smear showed macrocytic anemia, neutrophilic left shift, and  thrombocytosis.  SPEP/IFE were normal.  Hemolysis labs unremarkable. - DIFFERENTIAL DIAGNOSIS favors multifactorial anemia due to chronic GI blood loss, myelosuppressive effects of hydroxyurea, and anemia related to chronic disease - Labs today (03/04/2023): Hgb 10.2/MCV 106.1, NRBC 1.1 - Denies any gross signs or symptoms of blood loss such as hematemesis, hematochezia, or melena     - Symptomatic with  ongoing chronic fatigue      3.  Vitamin B12  and folate deficiencies - Folic acid deficiency noted on 02/12/2022 with folate 5.3, normal homocystine - Borderline vitamin B12 noted on 02/12/2022 with B12 287 and normal MMA - Patient taking vitamin B12 500 mcg daily and folic acid 400 mcg daily  - Most recent labs (12/02/2022) with improved folate 17.4, vitamin B12 836, MMA normal.   4. 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 - At last visit, we have dose reduced hydroxyurea to 2 tablets daily. - Platelet count today is 565, up from 440 on 04/30/2023. - No vasomotor symptoms or erythromelalgia's. - Continue aspirin 81 mg daily.  Will closely monitor. - He reports 1 week history of right-sided facial numbness mostly in the evenings lasting about 10 minutes.  He is planning to follow-up with his PMD.   2.  Myelodysplastic syndrome: - BMBX on 03/18/2023: Coexisting MDS with ring sideroblasts more than 15%, no increase in blasts.  MDS FISH panel and chromosome analysis were normal.  Serum EPO level 112.8.  NGS panel showed IDH 2, SF3 B1 and CALR mutations. - He received 1 dose of Monoferric on 05/19/2023.  He felt better for 2 to 3 weeks after the iron infusion. - Labs today shows hemoglobin 11.1.  LDH is 221.  Will consider adding ferritin and iron panel if we can to today's labs.  If the ferritin is less than 100, will consider giving another dose of IV iron. - If anemia gets worse in the future, will consider adding Luspatercept. - RTC 6 weeks for follow-up with repeat CBC,  LDH, ferritin, iron panel, B12 and folic acid.    3.  Vitamin B12 and folate deficiencies  - Continue B12 and folic acid supplements.     Orders Placed This Encounter  Procedures   Lactate dehydrogenase    Standing Status:   Future    Standing Expiration Date:   06/15/2024    Order Specific Question:   Release to patient    Answer:   Immediate   CBC    Standing Status:   Future    Standing Expiration Date:   06/15/2024   Ferritin    Standing Status:   Future    Standing Expiration Date:   06/15/2024    Order Specific Question:   Release to patient    Answer:   Immediate   Iron and TIBC    Standing Status:   Future    Standing Expiration Date:   06/15/2024    Order Specific Question:   Release to patient    Answer:   Immediate   Vitamin B12    Standing Status:   Future    Standing Expiration Date:   06/15/2024    Order Specific Question:   Release to patient    Answer:   Immediate   Folate    Standing Status:   Future    Standing Expiration Date:   06/15/2024    Order Specific Question:   Release  to patient    Answer:   Immediate   Ferritin    Standing Status:   Future    Number of Occurrences:   1    Standing Expiration Date:   06/15/2024    Order Specific Question:   Release to patient    Answer:   Immediate   Iron and TIBC    Standing Status:   Future    Number of Occurrences:   1    Standing Expiration Date:   06/15/2024    Order Specific Question:   Release to patient    Answer:   Immediate      I,Katie Daubenspeck,acting as a scribe for Doreatha Massed, MD.,have documented all relevant documentation on the behalf of Doreatha Massed, MD,as directed by  Doreatha Massed, MD while in the presence of Doreatha Massed, MD.   I, Doreatha Massed MD, have reviewed the above documentation for accuracy and completeness, and I agree with the above.   Doreatha Massed, MD   6/17/20244:26 PM  CHIEF COMPLAINT:   Diagnosis: CALR+ essential  thrombocytosis and iron deficiency anemia    Cancer Staging  No matching staging information was found for the patient.   Prior Therapy: Hydrea (1,000 mg Tu/Th/Su and 1,500 mg on Mo/We/Fr/Sat)   Current Therapy: Hydroxyurea 1000 mg daily.   HISTORY OF PRESENT ILLNESS:   Oncology History  Essential thrombocytosis (HCC)  08/31/2011 Cancer Diagnosis   History of colon cancer, status post resection in 2012, status post adjuvant chemotherapy with 6 months of 5-FU and leucovorin under the direction of Dr.Karb at [facility]   09/29/2017 Imaging   CT scan of the abdomen done for nonspecific abdominal pain shows incidental splenomegaly  Patient referred to [place] for further workup   10/30/2017 Genetic Testing   CALR mutation positive Jak 2 [id number] Negative   12/10/2017 Bone Marrow Biopsy   60% cellularity with significant megakaryocytic hyperplasia Grade 1 reticulin fibrosis Occasional hypo-lobulated megakaryocytes Flow cytometry with no significant abnormality Chromosome analysis 46, XY   12/10/2017 Initial Diagnosis   Essential thrombocytosis, CALR positive, high risk disease given his history of thrombosis and age more than 60  Hydroxyurea started on 12/12/2017      INTERVAL HISTORY:   Justin Berger is a 72 y.o. male presenting to clinic today for follow up of CALR+ essential thrombocytosis and iron deficiency anemia. He was last seen by me on 04/30/23.  Today, he states that he is doing well overall. His appetite level is at 65%. His energy level is at 10%.  PAST MEDICAL HISTORY:   Past Medical History: Past Medical History:  Diagnosis Date   AAA (abdominal aortic aneurysm) without rupture (HCC) 01/2021   Colon cancer (HCC) 2003   Diabetes (HCC)    Essential thrombocytosis (HCC)    Hypertension    Skin cancer, basal cell    back    Surgical History: Past Surgical History:  Procedure Laterality Date   BIOPSY  12/18/2021   Procedure: BIOPSY;  Surgeon: Dolores Frame, MD;  [facility];  Service: Gastroenterology;;   CATARACT EXTRACTION, BILATERAL     COLON SURGERY  06/23/2002   right colectomy   COLONOSCOPY  08/25/2020   Dr. Arlyn Dunning, normal colon other than venous lake in Left colon   COLONOSCOPY WITH PROPOFOL N/A 10/23/2021   Procedure: COLONOSCOPY WITH PROPOFOL;  Surgeon: Dolores Frame, MD;  [facility]: [facility];  Service: Gastroenterology;  Laterality: N/A;  12:30   ESOPHAGEAL BANDING  10/23/2021  Procedure: ESOPHAGEAL BANDING;  Surgeon: Marguerita Merles, Reuel Boom, MD;  [facility]: [facility];  Service: Gastroenterology;;   ESOPHAGEAL BANDING  12/18/2021   Procedure: ESOPHAGEAL BANDING;  Surgeon: Marguerita Merles, Reuel Boom, MD;  [facility]: [facility];  Service: Gastroenterology;;   ESOPHAGEAL BANDING  02/08/2022   Procedure: ESOPHAGEAL BANDING;  Surgeon: Marguerita Merles, Reuel Boom, MD;  [facility]: [facility];  Service: Gastroenterology;;   ESOPHAGOGASTRODUODENOSCOPY  05/25/2021   [facility]: moderate inflammation characterized by adherent blood, erythema, and friability in gastric body, antrum and prepyloric region, as well as 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   ESOPHAGOGASTRODUODENOSCOPY (EGD) WITH PROPOFOL N/A 10/23/2021   Procedure: ESOPHAGOGASTRODUODENOSCOPY (EGD) WITH PROPOFOL;  Surgeon: Dolores Frame, MD;  [facility];  Service: Gastroenterology;  Laterality: N/A;   ESOPHAGOGASTRODUODENOSCOPY (EGD) WITH PROPOFOL N/A 12/18/2021   Procedure: ESOPHAGOGASTRODUODENOSCOPY (EGD) WITH PROPOFOL;  Surgeon: Dolores Frame, MD;  [facility]: [facility];  Service: Gastroenterology;  Laterality: N/A;  8:05   ESOPHAGOGASTRODUODENOSCOPY (EGD) WITH PROPOFOL N/A 02/08/2022   Procedure: ESOPHAGOGASTRODUODENOSCOPY (EGD) WITH PROPOFOL;  Surgeon: Dolores Frame, MD;  [facility];  Service: Gastroenterology;  Laterality: N/A;  1055    ESOPHAGOGASTRODUODENOSCOPY (EGD) WITH PROPOFOL N/A 07/09/2022   Procedure: ESOPHAGOGASTRODUODENOSCOPY (EGD) WITH PROPOFOL;  Surgeon: Dolores Frame, MD;  [facility];  Service: Gastroenterology;  Laterality: N/A;  1130 ASA 2   ESOPHAGOGASTRODUODENOSCOPY (EGD) WITH PROPOFOL N/A 10/17/2022   Procedure: ESOPHAGOGASTRODUODENOSCOPY (EGD) WITH PROPOFOL;  Surgeon: Dolores Frame, MD;  [facility];  Service: Gastroenterology;  Laterality: N/A;  915 ASA 2   HEMOSTASIS CLIP PLACEMENT  02/08/2022   Procedure: HEMOSTASIS CLIP PLACEMENT;  Surgeon: Dolores Frame, MD;  [facility];  Service: Gastroenterology;;   IR BONE MARROW BIOPSY & ASPIRATION  03/18/2023   IR TRANSCATHETER BX  12/03/2021   IR US GUIDE VASC ACCESS RIGHT  12/03/2021   POLYPECTOMY  10/23/2021   Procedure: POLYPECTOMY INTESTINAL;  Surgeon: Dolores Frame, MD;  [facility];  Service: Gastroenterology;;   POLYPECTOMY  02/08/2022   Procedure: POLYPECTOMY;  Surgeon: Dolores Frame, MD;  [facility];  Service: Gastroenterology;;   TONSILLECTOMY      Social History: Social History   Socioeconomic History   Marital status: Married    Spouse name: Not on file   Number of children: Not on file   Years of education: Not on file   Highest education level: Not on file  Occupational History   Not on file  Tobacco Use   Smoking status: Former    Packs/day: 0.50    Years: 3.00    Additional pack years: 0.00    Total pack years: 1.50    Types: Cigarettes    Quit date: 04/08/1978    Years since quitting: 45.2   Smokeless tobacco: Never  Vaping Use   Vaping Use: Never used  Substance and Sexual Activity   Alcohol use: Never   Drug use: Never   Sexual activity: Not on file  Other Topics Concern   Not on file  Social History Narrative   Not on file   Social Determinants of Health   Financial Resource Strain: Low Risk   (11/29/2020)   Overall Financial Resource Strain (CARDIA)    Difficulty of Paying Living Expenses: Not hard at all  Food Insecurity: No Food Insecurity (11/29/2020)   Hunger Vital Sign    Worried About Running  Out of Food in the Last Year: Never true    Ran Out of Food in the Last Year: Never true  Transportation Needs: No Transportation Needs (11/29/2020)   PRAPARE - Administrator, Civil Service (Medical): No    Lack of Transportation (Non-Medical): No  Physical Activity: Inactive (11/29/2020)   Exercise Vital Sign    Days of Exercise per Week: 0 days    Minutes of Exercise per Session: 0 min  Stress: No Stress Concern Present (11/29/2020)   [employer] of Occupational Health - Occupational Stress Questionnaire    Feeling of Stress : Not at all  Social Connections: Moderately Integrated (11/29/2020)   Social Connection and Isolation Panel [NHANES]    Frequency of Communication with Friends and Family: More than three times a week    Frequency of Social Gatherings with Friends and Family: Three times a week    Attends Religious Services: 1 to 4 times per year    Active Member of Clubs or Organizations: No    Attends Banker Meetings: Never    Marital Status: Married  Catering manager Violence: Not At Risk (11/29/2020)   Humiliation, Afraid, Rape, and Kick questionnaire    Fear of Current or Ex-Partner: No    Emotionally Abused: No    Physically Abused: No    Sexually Abused: No    Family History: Family History  Problem Relation Age of Onset   Dementia Mother    Cancer Paternal Uncle        lung    Current Medications:  Current Outpatient Medications:    ACCU-CHEK GUIDE test strip, SMARTSIG:Strip(s) Via Meter Daily, Disp: , Rfl:    Accu-Chek Softclix Lancets lancets, USE TO TEST BLOOD SUGAR DAILY, Disp: , Rfl:    acetaminophen (TYLENOL) 500 MG tablet, Take 1,000 mg by mouth every 6 (six) hours as needed for moderate pain or headache., Disp: ,  Rfl:    allopurinol (ZYLOPRIM) 300 MG tablet, Take 300 mg by mouth in the morning., Disp: , Rfl:    Ascorbic Acid (VITAMIN C PO), Take 2,000 mg by mouth daily as needed (during winter season)., Disp: , Rfl:    aspirin 81 MG EC tablet, Take 81 mg by mouth in the morning., Disp: , Rfl:    atorvastatin (LIPITOR) 10 MG tablet, Take 10 mg by mouth every Sunday., Disp: , Rfl:    Blood Glucose Monitoring Suppl (ACCU-CHEK GUIDE ME) w/Device KIT, USE TO TEST FASTING SUGAR ONCE DAILY, Disp: , Rfl:    Cholecalciferol (VITAMIN D3 PO), Take 2 capsules by mouth in the morning., Disp: , Rfl:    clonazePAM (KLONOPIN) 0.5 MG tablet, Take 0.5 mg by mouth daily as needed for anxiety., Disp: , Rfl:    DULoxetine (CYMBALTA) 60 MG capsule, Take 60 mg by mouth in the morning., Disp: , Rfl:    empagliflozin (JARDIANCE) 25 MG TABS tablet, Take by mouth., Disp: , Rfl:    fluticasone (FLONASE) 50 MCG/ACT nasal spray, Place 1 spray into both nostrils daily as needed for allergies or rhinitis., Disp: , Rfl:    gabapentin (NEURONTIN) 300 MG capsule, Take 900 mg by mouth at bedtime., Disp: , Rfl:    glipiZIDE (GLUCOTROL XL) 10 MG 24 hr tablet, Take 10 mg by mouth 2 (two) times daily., Disp: , Rfl:    hydroxyurea (HYDREA) 500 MG capsule, Take 2 capsules (1,000 mg total) by mouth 3 (three) times a week AND 3 capsules (1,500 mg total) 4 (four) times  a week. [2 capsules on Tu/Th/Sun, 3 capsules on M/W/F/Sat]. May take with food to minimize GI side effects.., Disp: 204 capsule, Rfl: 3   loratadine (CLARITIN) 10 MG tablet, Take 10 mg by mouth daily as needed for allergies., Disp: , Rfl:    metFORMIN (GLUCOPHAGE-XR) 500 MG 24 hr tablet, Take 1,000 mg by mouth in the morning and at bedtime. 2 in the morning and 2 at  bedtime, Disp: , Rfl: 6   metoprolol succinate (TOPROL-XL) 50 MG 24 hr tablet, Take 50 mg by mouth every evening., Disp: , Rfl:    Multiple Vitamin (MULTIVITAMIN WITH MINERALS) TABS tablet, Take 1 tablet by mouth in the  morning., Disp: , Rfl:    omeprazole (PRILOSEC) 40 MG capsule, Take 1 capsule (40 mg total) by mouth daily., Disp: 90 capsule, Rfl: 3   sucralfate (CARAFATE) 1 g tablet, Take 1 tablet (1 g total) by mouth 2 (two) times daily., Disp: 28 tablet, Rfl: 0   tadalafil (CIALIS) 20 MG tablet, Take 20 mg by mouth daily as needed for erectile dysfunction., Disp: , Rfl:    zinc gluconate 50 MG tablet, Take 50 mg by mouth daily as needed (during the winter season)., Disp: , Rfl:    Allergies: Allergies  Allergen Reactions   Ivp Dye  [Iodinated Contrast Media]     Heart stopped     REVIEW OF SYSTEMS:   Review of Systems  Constitutional:  Negative for chills, fatigue and fever.  HENT:   Negative for lump/mass, mouth sores, nosebleeds, sore throat and trouble swallowing.   Eyes:  Negative for eye problems.  Respiratory:  Negative for cough and shortness of breath.   Cardiovascular:  Negative for chest pain, leg swelling and palpitations.  Gastrointestinal:  Positive for diarrhea. Negative for abdominal pain, constipation, nausea and vomiting.  Genitourinary:  Negative for bladder incontinence, difficulty urinating, dysuria, frequency, hematuria and nocturia.   Musculoskeletal:  Negative for arthralgias, back pain, flank pain, myalgias and neck pain.  Skin:  Negative for itching and rash.  Neurological:  Positive for dizziness and numbness. Negative for headaches.  Hematological:  Does not bruise/bleed easily.  Psychiatric/Behavioral:  Negative for depression, sleep disturbance and suicidal ideas. The patient is not nervous/anxious.   All other systems reviewed and are negative.    VITALS:   Blood pressure 139/76, pulse (!) 56, temperature 98.2 F (36.8 C), temperature source Oral, resp. rate 17, SpO2 100 %.  Wt Readings from Last 3 Encounters:  05/19/23 235 lb 6.4 oz (106.8 kg)  04/30/23 235 lb 14.3 oz (107 kg)  03/31/23 236 lb 9.6 oz (107.3 kg)    There is no height or weight on file to  calculate BMI.  Performance status (ECOG): 1 - Symptomatic but completely ambulatory  PHYSICAL EXAM:   Physical Exam Vitals and nursing note reviewed. Exam conducted with a chaperone present.  Constitutional:      Appearance: Normal appearance.  Cardiovascular:     Rate and Rhythm: Normal rate and regular rhythm.     Pulses: Normal pulses.     Heart sounds: Normal heart sounds.  Pulmonary:     Effort: Pulmonary effort is normal.     Breath sounds: Normal breath sounds.  Abdominal:     Palpations: Abdomen is soft. There is no hepatomegaly, splenomegaly or mass.     Tenderness: There is no abdominal tenderness.  Musculoskeletal:     Right lower leg: No edema.     Left lower leg: No edema.  Lymphadenopathy:     Cervical: No cervical adenopathy.     Right cervical: No superficial, deep or posterior cervical adenopathy.    Left cervical: No superficial, deep or posterior cervical adenopathy.     Upper Body:     Right upper body: No supraclavicular or axillary adenopathy.     Left upper body: No supraclavicular or axillary adenopathy.  Neurological:     General: No focal deficit present.     Mental Status: He is alert and oriented to person, place, and time.  Psychiatric:        Mood and Affect: Mood normal.        Behavior: Behavior normal.     LABS:      Latest Ref Rng & Units 06/16/2023    1:34 PM 04/30/2023   12:13 PM 03/31/2023    3:34 PM  CBC  WBC 4.0 - 10.5 K/uL 5.2  4.1  4.6   Hemoglobin 13.0 - 17.0 g/dL 40.9  81.1  91.4   Hematocrit 39.0 - 52.0 % 36.0  34.9  34.8   Platelets 150 - 400 K/uL 565  440  439       Latest Ref Rng & Units 03/04/2023    8:25 AM 12/02/2022    8:31 AM 08/22/2022    9:53 AM  CMP  Glucose 70 - 99 mg/dL 782  956  213   BUN 8 - 23 mg/dL 18  12  18    Creatinine 0.61 - 1.24 mg/dL 0.86  5.78  4.69   Sodium 135 - 145 mmol/L 137  139  138   Potassium 3.5 - 5.1 mmol/L 4.0  3.9  4.3   Chloride 98 - 111 mmol/L 105  106  106   CO2 22 - 32 mmol/L 22   24  24    Calcium 8.9 - 10.3 mg/dL 9.3  9.0  9.3   Total Protein 6.5 - 8.1 g/dL 6.6  6.7  6.9   Total Bilirubin 0.3 - 1.2 mg/dL 0.9  0.7  1.0   Alkaline Phos 38 - 126 U/L 43  35  35   AST 15 - 41 U/L 17  21  13    ALT 0 - 44 U/L 17  17  17       No results found for: "CEA1", "CEA" / No results found for: "CEA1", "CEA" No results found for: "PSA1" No results found for: "CAN199" No results found for: "CAN125"  Lab Results  Component Value Date   TOTALPROTELP 5.8 (L) 04/27/2021   TOTALPROTELP 5.7 (L) 04/27/2021   ALBUMINELP 3.6 04/27/2021   A1GS 0.2 04/27/2021   A2GS 0.7 04/27/2021   BETS 0.9 04/27/2021   GAMS 0.4 04/27/2021   MSPIKE Not Observed 04/27/2021   SPEI Comment 04/27/2021   Lab Results  Component Value Date   TIBC 425 04/30/2023   TIBC 398 03/04/2023   TIBC 399 12/02/2022   FERRITIN 96 04/30/2023   FERRITIN 114 03/04/2023   FERRITIN 135 12/02/2022   IRONPCTSAT 22 04/30/2023   IRONPCTSAT 28 03/04/2023   IRONPCTSAT 26 12/02/2022   Lab Results  Component Value Date   LDH 221 (H) 06/16/2023   LDH 172 04/30/2023   LDH 189 12/02/2022     STUDIES:   No results found.

## 2023-06-16 ENCOUNTER — Inpatient Hospital Stay (HOSPITAL_BASED_OUTPATIENT_CLINIC_OR_DEPARTMENT_OTHER): Payer: Medicare Other | Admitting: Hematology

## 2023-06-16 ENCOUNTER — Inpatient Hospital Stay: Payer: Medicare Other | Attending: Physician Assistant

## 2023-06-16 VITALS — BP 139/76 | HR 56 | Temp 98.2°F | Resp 17

## 2023-06-16 DIAGNOSIS — Z85820 Personal history of malignant melanoma of skin: Secondary | ICD-10-CM | POA: Diagnosis not present

## 2023-06-16 DIAGNOSIS — D473 Essential (hemorrhagic) thrombocythemia: Secondary | ICD-10-CM | POA: Diagnosis not present

## 2023-06-16 DIAGNOSIS — Z7982 Long term (current) use of aspirin: Secondary | ICD-10-CM | POA: Insufficient documentation

## 2023-06-16 DIAGNOSIS — E538 Deficiency of other specified B group vitamins: Secondary | ICD-10-CM

## 2023-06-16 DIAGNOSIS — Z85038 Personal history of other malignant neoplasm of large intestine: Secondary | ICD-10-CM | POA: Insufficient documentation

## 2023-06-16 DIAGNOSIS — D500 Iron deficiency anemia secondary to blood loss (chronic): Secondary | ICD-10-CM | POA: Diagnosis not present

## 2023-06-16 DIAGNOSIS — Z79899 Other long term (current) drug therapy: Secondary | ICD-10-CM | POA: Insufficient documentation

## 2023-06-16 DIAGNOSIS — D469 Myelodysplastic syndrome, unspecified: Secondary | ICD-10-CM | POA: Insufficient documentation

## 2023-06-16 DIAGNOSIS — D539 Nutritional anemia, unspecified: Secondary | ICD-10-CM | POA: Insufficient documentation

## 2023-06-16 LAB — CBC WITH DIFFERENTIAL/PLATELET
Abs Immature Granulocytes: 0.3 10*3/uL — ABNORMAL HIGH (ref 0.00–0.07)
Basophils Absolute: 0.3 10*3/uL — ABNORMAL HIGH (ref 0.0–0.1)
Basophils Relative: 5 %
Eosinophils Absolute: 0.1 10*3/uL (ref 0.0–0.5)
Eosinophils Relative: 2 %
HCT: 36 % — ABNORMAL LOW (ref 39.0–52.0)
Hemoglobin: 11.1 g/dL — ABNORMAL LOW (ref 13.0–17.0)
Lymphocytes Relative: 16 %
Lymphs Abs: 0.8 10*3/uL (ref 0.7–4.0)
MCH: 32.8 pg (ref 26.0–34.0)
MCHC: 30.8 g/dL (ref 30.0–36.0)
MCV: 106.5 fL — ABNORMAL HIGH (ref 80.0–100.0)
Metamyelocytes Relative: 4 %
Monocytes Absolute: 0.5 10*3/uL (ref 0.1–1.0)
Monocytes Relative: 9 %
Myelocytes: 1 %
Neutro Abs: 3.3 10*3/uL (ref 1.7–7.7)
Neutrophils Relative %: 63 %
Platelets: 565 10*3/uL — ABNORMAL HIGH (ref 150–400)
RBC: 3.38 MIL/uL — ABNORMAL LOW (ref 4.22–5.81)
RDW: 18.7 % — ABNORMAL HIGH (ref 11.5–15.5)
WBC: 5.2 10*3/uL (ref 4.0–10.5)
nRBC: 1 % — ABNORMAL HIGH (ref 0.0–0.2)

## 2023-06-16 LAB — IRON AND TIBC
Iron: 98 ug/dL (ref 45–182)
Saturation Ratios: 28 % (ref 17.9–39.5)
TIBC: 351 ug/dL (ref 250–450)
UIBC: 253 ug/dL

## 2023-06-16 LAB — FERRITIN: Ferritin: 243 ng/mL (ref 24–336)

## 2023-06-16 LAB — LACTATE DEHYDROGENASE: LDH: 221 U/L — ABNORMAL HIGH (ref 98–192)

## 2023-06-16 NOTE — Patient Instructions (Addendum)
[facility]  Discharge Instructions  You were seen and examined today by Dr. Ellin Saba.  Dr. Ellin Saba discussed your most recent lab work and CT scan which revealed that your hemoglobin is still a little low.   Dr. Ellin Saba is going to see if we can add on a ferritin and iron panel to the labs you had drawn today.  Follow-up as scheduled in 6 weeks.    Thank you for choosing  [facility] to provide your oncology and hematology care.   To afford each patient quality time with our provider, please arrive at least 15 minutes before your scheduled appointment time. You may need to reschedule your appointment if you arrive late (10 or more minutes). Arriving late affects you and other patients whose appointments are after yours.  Also, if you miss three or more appointments without notifying the office, you may be dismissed from the clinic at the provider's discretion.    Again, thank you for choosing [facility].  Our hope is that these requests will decrease the amount of time that you wait before being seen by our physicians.   If you have a lab appointment with the Cancer Center - please note that after April 8th, all labs will be drawn in the cancer center.  You do not have to check in or register with the main entrance as you have in the past but will complete your check-in at the cancer center.            _____________________________________________________________  Should you have questions after your visit to [facility], please contact our office at [phone] and follow the prompts.  Our office hours are 8:00 a.m. to 4:30 p.m. Monday - Thursday and 8:00 a.m. to 2:30 p.m. Friday.  Please note that voicemails left after 4:00 p.m. may not be returned until the following business day.  We are closed weekends and all major holidays.  You do have access to a nurse 24-7, just call the main number to the  clinic [phone] and do not press any options, hold on the line and a nurse will answer the phone.    For prescription refill requests, have your pharmacy contact our office and allow 72 hours.    Masks are no longer required in the cancer centers. If you would like for your care team to wear a mask while they are taking care of you, please let them know. You may have one support person who is at least 73 years old accompany you for your appointments.

## 2023-06-27 DIAGNOSIS — H26491 Other secondary cataract, right eye: Secondary | ICD-10-CM | POA: Diagnosis not present

## 2023-07-29 ENCOUNTER — Other Ambulatory Visit: Payer: Self-pay

## 2023-07-29 ENCOUNTER — Inpatient Hospital Stay: Payer: Medicare Other

## 2023-07-29 ENCOUNTER — Inpatient Hospital Stay: Payer: Medicare Other | Attending: Physician Assistant | Admitting: Hematology

## 2023-07-29 VITALS — BP 130/80 | HR 62 | Temp 97.8°F | Resp 18

## 2023-07-29 DIAGNOSIS — D500 Iron deficiency anemia secondary to blood loss (chronic): Secondary | ICD-10-CM

## 2023-07-29 DIAGNOSIS — E538 Deficiency of other specified B group vitamins: Secondary | ICD-10-CM | POA: Diagnosis not present

## 2023-07-29 DIAGNOSIS — Z87891 Personal history of nicotine dependence: Secondary | ICD-10-CM | POA: Insufficient documentation

## 2023-07-29 DIAGNOSIS — Z85038 Personal history of other malignant neoplasm of large intestine: Secondary | ICD-10-CM | POA: Insufficient documentation

## 2023-07-29 DIAGNOSIS — D473 Essential (hemorrhagic) thrombocythemia: Secondary | ICD-10-CM | POA: Diagnosis not present

## 2023-07-29 DIAGNOSIS — Z7982 Long term (current) use of aspirin: Secondary | ICD-10-CM | POA: Diagnosis not present

## 2023-07-29 DIAGNOSIS — D461 Refractory anemia with ring sideroblasts: Secondary | ICD-10-CM | POA: Insufficient documentation

## 2023-07-29 DIAGNOSIS — Z85820 Personal history of malignant melanoma of skin: Secondary | ICD-10-CM | POA: Insufficient documentation

## 2023-07-29 LAB — CBC
HCT: 38.2 % — ABNORMAL LOW (ref 39.0–52.0)
Hemoglobin: 11.7 g/dL — ABNORMAL LOW (ref 13.0–17.0)
MCH: 32.3 pg (ref 26.0–34.0)
MCHC: 30.6 g/dL (ref 30.0–36.0)
MCV: 105.5 fL — ABNORMAL HIGH (ref 80.0–100.0)
Platelets: 501 10*3/uL — ABNORMAL HIGH (ref 150–400)
RBC: 3.62 MIL/uL — ABNORMAL LOW (ref 4.22–5.81)
RDW: 18 % — ABNORMAL HIGH (ref 11.5–15.5)
WBC: 6.1 10*3/uL (ref 4.0–10.5)
nRBC: 1.1 % — ABNORMAL HIGH (ref 0.0–0.2)

## 2023-07-29 LAB — IRON AND TIBC
Iron: 109 ug/dL (ref 45–182)
Saturation Ratios: 29 % (ref 17.9–39.5)
TIBC: 372 ug/dL (ref 250–450)
UIBC: 263 ug/dL

## 2023-07-29 LAB — LACTATE DEHYDROGENASE: LDH: 212 U/L — ABNORMAL HIGH (ref 98–192)

## 2023-07-29 LAB — VITAMIN B12: Vitamin B-12: 728 pg/mL (ref 180–914)

## 2023-07-29 LAB — FERRITIN: Ferritin: 196 ng/mL (ref 24–336)

## 2023-07-29 LAB — FOLATE: Folate: 9.9 ng/mL (ref >5.9)

## 2023-07-29 MED ORDER — METHYLPHENIDATE HCL 5 MG PO TABS: 5.0000 mg | ORAL_TABLET | Freq: Every day | ORAL | 0 refills | Status: DC | PRN

## 2023-07-29 NOTE — Progress Notes (Signed)
Justin [facility] [address] [place] [address]    Clinic Day:  07/29/2023  Referring physician: Doreatha Massed, MD  Patient Care Team: Justin Massed, MD as PCP - General (Hematology) [facility] Justin Friends, MD as Consulting Physician (Gastroenterology) Justin Massed, MD as Consulting Physician (Hematology)   ASSESSMENT & PLAN:   Assessment: 1.  Essential thrombocytosis, CALR+ - High risk disease with history of thrombosis (left leg DVT) and age > 43 - Bone marrow biopsy 12/10/2017 by Dr. Ellin Berger at [facility] in [place], [place] Flow cytometty for leukemia/lymphoma was non-diagnostic Pathology review showed 60% cellularity with significant megakaryocytic hyperplasia with clusters of megakaryocytes; reticulin stain shows slight (Grade 1) reticulin fibrosis, some centered around areas of megakaryocyte clusters Cytogenic analysis showed normal male karyotype 46,XY[20] Noted to be at risk for progression to post essential thrombocythemia related myelofibrosis, but peripheral blood did not show other features of myelofibrosis, such as leukoerythroblastic blood or atypical megakaryocyte morphology - Intermittent bluish discoloration of bilateral palms, nonpainful.  He reports intermittent blurry vision.  Reports worsening daytime "sweating spells" and drenching night sweats. - Hydroxyurea started in December 2018 - current dose 1000 mg TTSun and 1500 mg MWFSat. - CT abdomen and pelvis on 09/29/2017 showed splenomegaly with spleen 14.5 cm.  Abdominal ultrasound (11/02/2021): Stable splenomegaly with spleen 14.3 cm.  MRI abdomen (11/20/2021): No splenomegaly noted.     2.  Myelodysplastic syndrome: - BMBX on 03/18/2023: Coexisting MDS with ring sideroblasts more than 15%, no increase in blasts.  MDS FISH panel and chromosome analysis were normal.  Serum EPO level 112.8.  NGS panel showed IDH 2, SF3 B1 and CALR mutations.    3.  Vitamin B12 and  folate deficiencies - Folic acid deficiency noted on 02/12/2022 with folate 5.3, normal homocystine - Borderline vitamin B12 noted on 02/12/2022 with B12 287 and normal MMA - Patient taking vitamin B12 500 mcg daily and folic acid 400 mcg daily  - Most recent labs (12/02/2022) with improved folate 17.4, vitamin B12 836, MMA normal.   4. 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  CALR positive essential thrombocytosis: - He is taking hydroxyurea 2 tablets daily.  He is tolerating reasonably well. - He denies any aquagenic pruritus.  No fevers or night sweats.  No vasomotor symptoms. - His palms turn blue for few minutes after taking warm shower. - Reports tiredness which is worse on few days.  He will always get tired by midday. - Continue aspirin 81 mg daily.  Continue hydroxyurea 2 tablets daily. - Will give a trial of Justin Berger 5 mg as needed for fatigue. - Will plan on checking testosterone levels at next visit in [redacted] weeks along with routine labs.   2.  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 - Hemoglobin today is 11.7, ferritin 196, percent saturation 29.  B12 and folic acid normal. - If there is any significant anemia, will consider Luspatercept.    3.  Vitamin B12 and folate deficiencies  - B12 and folic acid are normal.  Continue supplements.     Orders Placed This Encounter  Procedures   CBC with Differential/Platelet    Standing Status:   Future    Standing Expiration Date:   07/28/2024    Order Specific Question:   Release to patient    Answer:   Immediate   Comprehensive metabolic  panel    Standing Status:   Future    Standing Expiration Date:   07/28/2024    Order Specific Question:   Release to patient    Answer:   Immediate   Ferritin    Standing Status:   Future    Standing Expiration Date:   07/28/2024    Order Specific Question:   Release to patient    Answer:   Immediate   Iron and TIBC    Standing Status:   Future    Standing Expiration Date:   07/28/2024    Order Specific Question:   Release to patient    Answer:   Immediate   Testosterone    Standing Status:   Future    Standing Expiration Date:   07/28/2024      Justin Berger,acting as a scribe for Justin Massed, MD.,have documented all relevant documentation on the behalf of Justin Massed, MD,as directed by  Justin Massed, MD while in the presence of Justin Massed, MD.  I, Justin Massed MD, have reviewed the above documentation for accuracy and completeness, and I agree with the above.    Justin Massed, MD   7/30/20244:46 PM  CHIEF COMPLAINT:   Diagnosis: CALR+ essential thrombocytosis and iron deficiency anemia    Cancer Staging  No matching staging information was found for the patient.    Prior Therapy: Hydrea (1,000 mg Tu/Th/Su and 1,500 mg on Mo/We/Fr/Sat)   Current Therapy: Hydroxyurea 1000 mg daily.   HISTORY OF PRESENT ILLNESS:   Oncology History  Essential thrombocytosis (HCC)  08/31/2011 Cancer Diagnosis   History of colon cancer, status post resection in 2012, status post adjuvant chemotherapy with 6 months of 5-FU and leucovorin under the direction of Dr.Karb at [facility]   09/29/2017 Imaging   CT scan of the abdomen done for nonspecific abdominal pain shows incidental splenomegaly  Patient referred to [place] for further workup   10/30/2017 Genetic Testing   CALR mutation positive Jak 2 [id number] Negative   12/10/2017 Bone Marrow Biopsy   60% cellularity with significant megakaryocytic hyperplasia Grade 1 reticulin  fibrosis Occasional hypo-lobulated megakaryocytes Flow cytometry with no significant abnormality Chromosome analysis 46, XY   12/10/2017 Initial Diagnosis   Essential thrombocytosis, CALR positive, high risk disease given his history of thrombosis and age more than 59  Hydroxyurea started on 12/12/2017      INTERVAL HISTORY:   Justin Berger is a 72 y.o. male presenting to clinic today for follow up of CALR+ essential thrombocytosis and iron deficiency anemia. He was last seen by me on 06/16/23.  Today, he states that he is doing well overall. His appetite level is at 100%. His energy level is at 10%.  PAST MEDICAL HISTORY:   Past Medical History: Past Medical History:  Diagnosis Date   AAA (abdominal aortic aneurysm) without rupture (HCC) 01/2021   Colon cancer (HCC) 2003   Diabetes (HCC)    Essential thrombocytosis (HCC)    Hypertension    Skin cancer, basal cell    back    Surgical History: Past Surgical History:  Procedure Laterality Date   BIOPSY  12/18/2021   Procedure: BIOPSY;  Surgeon: Dolores Frame, MD;  [facility];  Service:  Gastroenterology;;   CATARACT EXTRACTION, BILATERAL     COLON SURGERY  06/23/2002   right colectomy   COLONOSCOPY  08/25/2020   Dr. Arlyn Dunning, normal colon other than venous lake in Left colon   COLONOSCOPY WITH PROPOFOL N/A 10/23/2021   Procedure: COLONOSCOPY WITH PROPOFOL;  Surgeon: Dolores Frame, MD;  [facility];  Service: Gastroenterology;  Laterality: N/A;  12:30   ESOPHAGEAL BANDING  10/23/2021   Procedure: ESOPHAGEAL BANDING;  Surgeon: Dolores Frame, MD;  [facility];  Service: Gastroenterology;;   ESOPHAGEAL BANDING  12/18/2021   Procedure: ESOPHAGEAL BANDING;  Surgeon: Marguerita Merles, Reuel Boom, MD;  [facility];  Service: Gastroenterology;;   ESOPHAGEAL BANDING  02/08/2022   Procedure: ESOPHAGEAL BANDING;  Surgeon: Marguerita Merles, Reuel Boom, MD;   [facility];  Service: Gastroenterology;;   ESOPHAGOGASTRODUODENOSCOPY  05/25/2021   [facility]: moderate inflammation characterized by adherent blood, erythema, and friability in gastric body, antrum and prepyloric region, as well as 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   ESOPHAGOGASTRODUODENOSCOPY (EGD) WITH PROPOFOL N/A 10/23/2021   Procedure: ESOPHAGOGASTRODUODENOSCOPY (EGD) WITH PROPOFOL;  Surgeon: Dolores Frame, MD;  [facility];  Service: Gastroenterology;  Laterality: N/A;   ESOPHAGOGASTRODUODENOSCOPY (EGD) WITH PROPOFOL N/A 12/18/2021   Procedure: ESOPHAGOGASTRODUODENOSCOPY (EGD) WITH PROPOFOL;  Surgeon: Dolores Frame, MD;  [facility];  Service: Gastroenterology;  Laterality: N/A;  8:05   ESOPHAGOGASTRODUODENOSCOPY (EGD) WITH PROPOFOL N/A 02/08/2022   Procedure: ESOPHAGOGASTRODUODENOSCOPY (EGD) WITH PROPOFOL;  Surgeon: Dolores Frame, MD;  [facility];  Service: Gastroenterology;  Laterality: N/A;  1055   ESOPHAGOGASTRODUODENOSCOPY (EGD) WITH PROPOFOL N/A 07/09/2022   Procedure: ESOPHAGOGASTRODUODENOSCOPY (EGD) WITH PROPOFOL;  Surgeon: Dolores Frame, MD;  [facility];  Service: Gastroenterology;  Laterality: N/A;  1130 ASA 2   ESOPHAGOGASTRODUODENOSCOPY (EGD) WITH PROPOFOL N/A 10/17/2022   Procedure: ESOPHAGOGASTRODUODENOSCOPY (EGD) WITH PROPOFOL;  Surgeon: Dolores Frame, MD;  [facility];  Service: Gastroenterology;  Laterality: N/A;  915 ASA 2   HEMOSTASIS CLIP PLACEMENT  02/08/2022   Procedure: HEMOSTASIS CLIP PLACEMENT;  Surgeon: Dolores Frame, MD;  [facility];  Service: Gastroenterology;;   IR BONE MARROW BIOPSY & ASPIRATION  03/18/2023   IR TRANSCATHETER BX  12/03/2021   IR US GUIDE VASC ACCESS RIGHT  12/03/2021   POLYPECTOMY  10/23/2021   Procedure: POLYPECTOMY INTESTINAL;  Surgeon: Dolores Frame, MD;  [facility];  Service: Gastroenterology;;   POLYPECTOMY  02/08/2022   Procedure: POLYPECTOMY;  Surgeon: Dolores Frame, MD;  [facility];  Service: Gastroenterology;;   TONSILLECTOMY      Social History: Social History   Socioeconomic History   Marital status: Married    Spouse name: Not on file   Number of children: Not on file   Years of education: Not on file   Highest education level: Not on file  Occupational History   Not on file  Tobacco Use   Smoking status: Former    Current packs/day: 0.00    Average packs/day: 0.5 packs/day for 3.0 years (1.5 ttl pk-yrs)    Types: Cigarettes    Start date: 04/09/1975    Quit date: 04/08/1978    Years since quitting: 45.3   Smokeless tobacco: Never  Vaping Use   Vaping status: Never Used  Substance and Sexual Activity   Alcohol use: Never   Drug use:  Never   Sexual activity: Not on file  Other Topics Concern   Not on file  Social History Narrative   Not on file   Social Determinants of Health   Financial Resource Strain: Low Risk  (11/29/2020)   Overall Financial Resource Strain (CARDIA)    Difficulty of Paying Living Expenses: Not hard at all  Food Insecurity: No Food Insecurity (11/29/2020)   Hunger Vital Sign    Worried About Running Out of Food in the Last Year: Never true    Ran Out of Food in the Last Year: Never true  Transportation Needs: No Transportation Needs (11/29/2020)   PRAPARE - Administrator, Civil Service (Medical): No    Lack of Transportation (Non-Medical): No  Physical Activity: Inactive (11/29/2020)   Exercise Vital Sign    Days of Exercise per Week: 0 days    Minutes of Exercise per Session: 0 min  Stress: No Stress Concern Present (11/29/2020)   [employer] of Occupational Health - Occupational Stress Questionnaire    Feeling of Stress : Not at all  Social Connections: Moderately Integrated (11/29/2020)   Social Connection and Isolation  Panel [NHANES]    Frequency of Communication with Berger and Family: More than three times a week    Frequency of Social Gatherings with Berger and Family: Three times a week    Attends Religious Services: 1 to 4 times per year    Active Member of Clubs or Organizations: No    Attends Banker Meetings: Never    Marital Status: Married  Catering manager Violence: Not At Risk (11/29/2020)   Humiliation, Afraid, Rape, and Kick questionnaire    Fear of Current or Ex-Partner: No    Emotionally Abused: No    Physically Abused: No    Sexually Abused: No    Family History: Family History  Problem Relation Age of Onset   Dementia Mother    Cancer Paternal Uncle        lung    Current Medications:  Current Outpatient Medications:    ACCU-CHEK GUIDE test strip, SMARTSIG:Strip(s) Via Meter Daily, Disp: , Rfl:    Accu-Chek Softclix Lancets lancets, USE TO TEST BLOOD SUGAR DAILY, Disp: , Rfl:    acetaminophen (TYLENOL) 500 MG tablet, Take 1,000 mg by mouth every 6 (six) hours as needed for moderate pain or headache., Disp: , Rfl:    allopurinol (ZYLOPRIM) 300 MG tablet, Take 300 mg by mouth in the morning., Disp: , Rfl:    Ascorbic Acid (VITAMIN C PO), Take 2,000 mg by mouth daily as needed (during winter season)., Disp: , Rfl:    aspirin 81 MG EC tablet, Take 81 mg by mouth in the morning., Disp: , Rfl:    atorvastatin (LIPITOR) 10 MG tablet, Take 10 mg by mouth every Sunday., Disp: , Rfl:    Blood Glucose Monitoring Suppl (ACCU-CHEK GUIDE ME) w/Device KIT, USE TO TEST FASTING SUGAR ONCE DAILY, Disp: , Rfl:    Cholecalciferol (VITAMIN D3 PO), Take 2 capsules by mouth in the morning., Disp: , Rfl:    clonazePAM (KLONOPIN) 0.5 MG tablet, Take 0.5 mg by mouth daily as needed for anxiety., Disp: , Rfl:    DULoxetine (CYMBALTA) 60 MG capsule, Take 60 mg by mouth in the morning., Disp: , Rfl:    empagliflozin (JARDIANCE) 25 MG TABS tablet, Take by mouth., Disp: , Rfl:    fluticasone  (FLONASE) 50 MCG/ACT nasal spray, Place 1 spray into both nostrils daily as  needed for allergies or rhinitis., Disp: , Rfl:    gabapentin (NEURONTIN) 300 MG capsule, Take 900 mg by mouth at bedtime., Disp: , Rfl:    glipiZIDE (GLUCOTROL XL) 10 MG 24 hr tablet, Take 10 mg by mouth 2 (two) times daily., Disp: , Rfl:    hydroxyurea (HYDREA) 500 MG capsule, Take 2 capsules (1,000 mg total) by mouth 3 (three) times a week AND 3 capsules (1,500 mg total) 4 (four) times a week. [2 capsules on Tu/Th/Sun, 3 capsules on M/W/F/Sat]. May take with food to minimize GI side effects.., Disp: 204 capsule, Rfl: 3   loratadine (CLARITIN) 10 MG tablet, Take 10 mg by mouth daily as needed for allergies., Disp: , Rfl:    metFORMIN (GLUCOPHAGE-XR) 500 MG 24 hr tablet, Take 1,000 mg by mouth in the morning and at bedtime. 2 in the morning and 2 at  bedtime, Disp: , Rfl: 6   metoprolol succinate (TOPROL-XL) 50 MG 24 hr tablet, Take 50 mg by mouth every evening., Disp: , Rfl:    Multiple Vitamin (MULTIVITAMIN WITH MINERALS) TABS tablet, Take 1 tablet by mouth in the morning., Disp: , Rfl:    omeprazole (PRILOSEC) 40 MG capsule, Take 1 capsule (40 mg total) by mouth daily., Disp: 90 capsule, Rfl: 3   sucralfate (CARAFATE) 1 g tablet, Take 1 tablet (1 g total) by mouth 2 (two) times daily., Disp: 28 tablet, Rfl: 0   tadalafil (CIALIS) 20 MG tablet, Take 20 mg by mouth daily as needed for erectile dysfunction., Disp: , Rfl:    zinc gluconate 50 MG tablet, Take 50 mg by mouth daily as needed (during the winter season)., Disp: , Rfl:    Allergies: Allergies  Allergen Reactions   Ivp Dye  [Iodinated Contrast Media]     Heart stopped     REVIEW OF SYSTEMS:   Review of Systems  Constitutional:  Positive for fatigue. Negative for chills and fever.  HENT:   Negative for lump/mass, mouth sores, nosebleeds, sore throat and trouble swallowing.   Eyes:  Negative for eye problems.  Respiratory:  Positive for shortness of breath.  Negative for cough.   Cardiovascular:  Negative for chest pain, leg swelling and palpitations.  Gastrointestinal:  Positive for abdominal pain and diarrhea. Negative for constipation, nausea and vomiting.  Genitourinary:  Negative for bladder incontinence, difficulty urinating, dysuria, frequency, hematuria and nocturia.   Musculoskeletal:  Negative for arthralgias, back pain, flank pain, myalgias and neck pain.  Skin:  Negative for itching and rash.  Neurological:  Positive for dizziness and numbness. Negative for headaches.  Hematological:  Bruises/bleeds easily.  Psychiatric/Behavioral:  Negative for depression, sleep disturbance and suicidal ideas. The patient is not nervous/anxious.   All other systems reviewed and are negative.    VITALS:   Blood pressure 130/80, pulse 62, temperature 97.8 F (36.6 C), temperature source Oral, resp. rate 18, SpO2 98%.  Wt Readings from Last 3 Encounters:  05/19/23 235 lb 6.4 oz (106.8 kg)  04/30/23 235 lb 14.3 oz (107 kg)  03/31/23 236 lb 9.6 oz (107.3 kg)    There is no height or weight on file to calculate BMI.  Performance status (ECOG): 1 - Symptomatic but completely ambulatory  PHYSICAL EXAM:   Physical Exam Vitals and nursing note reviewed. Exam conducted with a chaperone present.  Constitutional:      Appearance: Normal appearance.  Cardiovascular:     Rate and Rhythm: Normal rate and regular rhythm.     Pulses: Normal  pulses.     Heart sounds: Normal heart sounds.  Pulmonary:     Effort: Pulmonary effort is normal.     Breath sounds: Normal breath sounds.  Abdominal:     Palpations: Abdomen is soft. There is no hepatomegaly, splenomegaly or mass.     Tenderness: There is no abdominal tenderness.  Musculoskeletal:     Right lower leg: No edema.     Left lower leg: No edema.  Lymphadenopathy:     Cervical: No cervical adenopathy.     Right cervical: No superficial, deep or posterior cervical adenopathy.    Left cervical: No  superficial, deep or posterior cervical adenopathy.     Upper Body:     Right upper body: No supraclavicular or axillary adenopathy.     Left upper body: No supraclavicular or axillary adenopathy.  Neurological:     General: No focal deficit present.     Mental Status: He is alert and oriented to person, place, and time.  Psychiatric:        Mood and Affect: Mood normal.        Behavior: Behavior normal.     LABS:      Latest Ref Rng & Units 07/29/2023   12:52 PM 06/16/2023    1:34 PM 04/30/2023   12:13 PM  CBC  WBC 4.0 - 10.5 K/uL 6.1  5.2  4.1   Hemoglobin 13.0 - 17.0 g/dL 16.1  09.6  04.5   Hematocrit 39.0 - 52.0 % 38.2  36.0  34.9   Platelets 150 - 400 K/uL 501  565  440       Latest Ref Rng & Units 03/04/2023    8:25 AM 12/02/2022    8:31 AM 08/22/2022    9:53 AM  CMP  Glucose 70 - 99 mg/dL 409  811  914   BUN 8 - 23 mg/dL 18  12  18    Creatinine 0.61 - 1.24 mg/dL 7.82  9.56  2.13   Sodium 135 - 145 mmol/L 137  139  138   Potassium 3.5 - 5.1 mmol/L 4.0  3.9  4.3   Chloride 98 - 111 mmol/L 105  106  106   CO2 22 - 32 mmol/L 22  24  24    Calcium 8.9 - 10.3 mg/dL 9.3  9.0  9.3   Total Protein 6.5 - 8.1 g/dL 6.6  6.7  6.9   Total Bilirubin 0.3 - 1.2 mg/dL 0.9  0.7  1.0   Alkaline Phos 38 - 126 U/L 43  35  35   AST 15 - 41 U/L 17  21  13    ALT 0 - 44 U/L 17  17  17       No results found for: "CEA1", "CEA" / No results found for: "CEA1", "CEA" No results found for: "PSA1" No results found for: "CAN199" No results found for: "CAN125"  Lab Results  Component Value Date   TOTALPROTELP 5.8 (L) 04/27/2021   TOTALPROTELP 5.7 (L) 04/27/2021   ALBUMINELP 3.6 04/27/2021   A1GS 0.2 04/27/2021   A2GS 0.7 04/27/2021   BETS 0.9 04/27/2021   GAMS 0.4 04/27/2021   MSPIKE Not Observed 04/27/2021   SPEI Comment 04/27/2021   Lab Results  Component Value Date   TIBC 372 07/29/2023   TIBC 351 06/16/2023   TIBC 425 04/30/2023   FERRITIN 196 07/29/2023   FERRITIN 243 06/16/2023    FERRITIN 96 04/30/2023   IRONPCTSAT 29 07/29/2023   IRONPCTSAT 28 06/16/2023  IRONPCTSAT 22 04/30/2023   Lab Results  Component Value Date   LDH 212 (H) 07/29/2023   LDH 221 (H) 06/16/2023   LDH 172 04/30/2023     STUDIES:   No results found.

## 2023-07-29 NOTE — Patient Instructions (Addendum)
[facility]  Discharge Instructions  You were seen and examined today by Dr. Ellin Saba.  Dr. Ellin Saba discussed your most recent lab work which revealed everything looks good and stable.  We will call you based on testosterone levels. Dr. Ellin Saba sent in Ritalin for you to see if it will help with your fatigue.  Follow-up as scheduled in 12 weeks.    Thank you for choosing [facility] to provide your oncology and hematology care.   To afford each patient quality time with our provider, please arrive at least 15 minutes before your scheduled appointment time. You may need to reschedule your appointment if you arrive late (10 or more minutes). Arriving late affects you and other patients whose appointments are after yours.  Also, if you miss three or more appointments without notifying the office, you may be dismissed from the clinic at the provider's discretion.    Again, thank you for choosing [facility].  Our hope is that these requests will decrease the amount of time that you wait before being seen by our physicians.   If you have a lab appointment with the Cancer Center - please note that after April 8th, all labs will be drawn in the cancer center.  You do not have to check in or register with the main entrance as you have in the past but will complete your check-in at the cancer center.            _____________________________________________________________  Should you have questions after your visit to [facility], please contact our office at [phone] and follow the prompts.  Our office hours are 8:00 a.m. to 4:30 p.m. Monday - Thursday and 8:00 a.m. to 2:30 p.m. Friday.  Please note that voicemails left after 4:00 p.m. may not be returned until the following business day.  We are closed weekends and all major holidays.  You do have access to a nurse 24-7, just call the main number to the  clinic [phone] and do not press any options, hold on the line and a nurse will answer the phone.    For prescription refill requests, have your pharmacy contact our office and allow 72 hours.    Masks are no longer required in the cancer centers. If you would like for your care team to wear a mask while they are taking care of you, please let them know. You may have one support person who is at least 73 years old accompany you for your appointments.

## 2023-07-30 ENCOUNTER — Encounter: Payer: Self-pay | Admitting: *Deleted

## 2023-07-30 NOTE — Progress Notes (Signed)
Approval received from [facility] for Methylphenidate HCL 5 mg tab until further notice.

## 2023-08-01 NOTE — Progress Notes (Signed)
Patient called stating that he cannot tolerate the methylphenidate. He states that it caused him to have numbness in his legs, hands and feet. Doreatha Massed, MD made aware. Patient advised that he does not have to continue it that Dr. Ellin Saba had sent it for him to try to see if it would help with his fatigue.

## 2023-09-03 DIAGNOSIS — H40013 Open angle with borderline findings, low risk, bilateral: Secondary | ICD-10-CM | POA: Diagnosis not present

## 2023-09-03 DIAGNOSIS — E119 Type 2 diabetes mellitus without complications: Secondary | ICD-10-CM | POA: Diagnosis not present

## 2023-09-03 DIAGNOSIS — H34832 Tributary (branch) retinal vein occlusion, left eye, with macular edema: Secondary | ICD-10-CM | POA: Diagnosis not present

## 2023-09-16 DIAGNOSIS — Z6835 Body mass index (BMI) 35.0-35.9, adult: Secondary | ICD-10-CM | POA: Diagnosis not present

## 2023-09-16 DIAGNOSIS — S39012A Strain of muscle, fascia and tendon of lower back, initial encounter: Secondary | ICD-10-CM | POA: Diagnosis not present

## 2023-09-16 DIAGNOSIS — R030 Elevated blood-pressure reading, without diagnosis of hypertension: Secondary | ICD-10-CM | POA: Diagnosis not present

## 2023-09-26 ENCOUNTER — Other Ambulatory Visit (INDEPENDENT_AMBULATORY_CARE_PROVIDER_SITE_OTHER): Payer: Self-pay | Admitting: Gastroenterology

## 2023-09-26 DIAGNOSIS — K219 Gastro-esophageal reflux disease without esophagitis: Secondary | ICD-10-CM

## 2023-09-26 DIAGNOSIS — I8500 Esophageal varices without bleeding: Secondary | ICD-10-CM

## 2023-10-06 DIAGNOSIS — E1142 Type 2 diabetes mellitus with diabetic polyneuropathy: Secondary | ICD-10-CM | POA: Diagnosis not present

## 2023-10-06 DIAGNOSIS — Z1329 Encounter for screening for other suspected endocrine disorder: Secondary | ICD-10-CM | POA: Diagnosis not present

## 2023-10-06 DIAGNOSIS — E1165 Type 2 diabetes mellitus with hyperglycemia: Secondary | ICD-10-CM | POA: Diagnosis not present

## 2023-10-06 DIAGNOSIS — Z1321 Encounter for screening for nutritional disorder: Secondary | ICD-10-CM | POA: Diagnosis not present

## 2023-10-06 DIAGNOSIS — E7849 Other hyperlipidemia: Secondary | ICD-10-CM | POA: Diagnosis not present

## 2023-10-06 DIAGNOSIS — E7801 Familial hypercholesterolemia: Secondary | ICD-10-CM | POA: Diagnosis not present

## 2023-10-06 DIAGNOSIS — E7800 Pure hypercholesterolemia, unspecified: Secondary | ICD-10-CM | POA: Diagnosis not present

## 2023-10-06 DIAGNOSIS — E782 Mixed hyperlipidemia: Secondary | ICD-10-CM | POA: Diagnosis not present

## 2023-10-06 DIAGNOSIS — I10 Essential (primary) hypertension: Secondary | ICD-10-CM | POA: Diagnosis not present

## 2023-10-13 DIAGNOSIS — R4582 Worries: Secondary | ICD-10-CM | POA: Diagnosis not present

## 2023-10-13 DIAGNOSIS — M109 Gout, unspecified: Secondary | ICD-10-CM | POA: Diagnosis not present

## 2023-10-13 DIAGNOSIS — I10 Essential (primary) hypertension: Secondary | ICD-10-CM | POA: Diagnosis not present

## 2023-10-13 DIAGNOSIS — G252 Other specified forms of tremor: Secondary | ICD-10-CM | POA: Diagnosis not present

## 2023-10-13 DIAGNOSIS — Z0001 Encounter for general adult medical examination with abnormal findings: Secondary | ICD-10-CM | POA: Diagnosis not present

## 2023-10-13 DIAGNOSIS — D469 Myelodysplastic syndrome, unspecified: Secondary | ICD-10-CM | POA: Diagnosis not present

## 2023-10-13 DIAGNOSIS — Z23 Encounter for immunization: Secondary | ICD-10-CM | POA: Diagnosis not present

## 2023-10-13 DIAGNOSIS — E1142 Type 2 diabetes mellitus with diabetic polyneuropathy: Secondary | ICD-10-CM | POA: Diagnosis not present

## 2023-10-13 DIAGNOSIS — E7849 Other hyperlipidemia: Secondary | ICD-10-CM | POA: Diagnosis not present

## 2023-10-13 DIAGNOSIS — F331 Major depressive disorder, recurrent, moderate: Secondary | ICD-10-CM | POA: Diagnosis not present

## 2023-10-13 DIAGNOSIS — D473 Essential (hemorrhagic) thrombocythemia: Secondary | ICD-10-CM | POA: Diagnosis not present

## 2023-10-28 ENCOUNTER — Inpatient Hospital Stay: Payer: Medicare Other | Attending: Physician Assistant | Admitting: Hematology

## 2023-10-28 ENCOUNTER — Inpatient Hospital Stay: Payer: Medicare Other

## 2023-10-28 VITALS — BP 137/85 | HR 72 | Temp 98.4°F | Resp 18 | Wt 235.5 lb

## 2023-10-28 DIAGNOSIS — D473 Essential (hemorrhagic) thrombocythemia: Secondary | ICD-10-CM | POA: Diagnosis not present

## 2023-10-28 DIAGNOSIS — D509 Iron deficiency anemia, unspecified: Secondary | ICD-10-CM | POA: Insufficient documentation

## 2023-10-28 DIAGNOSIS — Z79899 Other long term (current) drug therapy: Secondary | ICD-10-CM | POA: Diagnosis not present

## 2023-10-28 DIAGNOSIS — D539 Nutritional anemia, unspecified: Secondary | ICD-10-CM | POA: Diagnosis not present

## 2023-10-28 DIAGNOSIS — Z85820 Personal history of malignant melanoma of skin: Secondary | ICD-10-CM | POA: Diagnosis not present

## 2023-10-28 DIAGNOSIS — Z87891 Personal history of nicotine dependence: Secondary | ICD-10-CM | POA: Diagnosis not present

## 2023-10-28 DIAGNOSIS — E538 Deficiency of other specified B group vitamins: Secondary | ICD-10-CM

## 2023-10-28 DIAGNOSIS — D500 Iron deficiency anemia secondary to blood loss (chronic): Secondary | ICD-10-CM

## 2023-10-28 DIAGNOSIS — Z9221 Personal history of antineoplastic chemotherapy: Secondary | ICD-10-CM | POA: Insufficient documentation

## 2023-10-28 LAB — COMPREHENSIVE METABOLIC PANEL
ALT: 18 U/L (ref 0–44)
AST: 15 U/L (ref 15–41)
Albumin: 4.5 g/dL (ref 3.5–5.0)
Alkaline Phosphatase: 46 U/L (ref 38–126)
Anion gap: 10 (ref 5–15)
BUN: 16 mg/dL (ref 8–23)
CO2: 24 mmol/L (ref 22–32)
Calcium: 9.5 mg/dL (ref 8.9–10.3)
Chloride: 104 mmol/L (ref 98–111)
Creatinine, Ser: 1.18 mg/dL (ref 0.61–1.24)
GFR, Estimated: >60 mL/min (ref >60)
Glucose, Bld: 153 mg/dL — ABNORMAL HIGH (ref 70–99)
Potassium: 4 mmol/L (ref 3.5–5.1)
Sodium: 138 mmol/L (ref 135–145)
Total Bilirubin: 0.8 mg/dL (ref 0.3–1.2)
Total Protein: 6.9 g/dL (ref 6.5–8.1)

## 2023-10-28 LAB — CBC WITH DIFFERENTIAL/PLATELET
Abs Immature Granulocytes: 0.1 10*3/uL — ABNORMAL HIGH (ref 0.00–0.07)
Band Neutrophils: 1 %
Basophils Absolute: 0.2 10*3/uL — ABNORMAL HIGH (ref 0.0–0.1)
Basophils Relative: 3 %
Eosinophils Absolute: 0 10*3/uL (ref 0.0–0.5)
Eosinophils Relative: 0 %
HCT: 40.5 % (ref 39.0–52.0)
Hemoglobin: 12.4 g/dL — ABNORMAL LOW (ref 13.0–17.0)
Lymphocytes Relative: 22 %
Lymphs Abs: 1.4 10*3/uL (ref 0.7–4.0)
MCH: 31.8 pg (ref 26.0–34.0)
MCHC: 30.6 g/dL (ref 30.0–36.0)
MCV: 103.8 fL — ABNORMAL HIGH (ref 80.0–100.0)
Metamyelocytes Relative: 1 %
Monocytes Absolute: 0.3 10*3/uL (ref 0.1–1.0)
Monocytes Relative: 5 %
Neutro Abs: 4.3 10*3/uL (ref 1.7–7.7)
Neutrophils Relative %: 68 %
Platelets: 467 10*3/uL — ABNORMAL HIGH (ref 150–400)
RBC: 3.9 MIL/uL — ABNORMAL LOW (ref 4.22–5.81)
RDW: 18.8 % — ABNORMAL HIGH (ref 11.5–15.5)
WBC: 6.2 10*3/uL (ref 4.0–10.5)
nRBC: 1.9 % — ABNORMAL HIGH (ref 0.0–0.2)

## 2023-10-28 LAB — IRON AND TIBC
Iron: 102 ug/dL (ref 45–182)
Saturation Ratios: 25 % (ref 17.9–39.5)
TIBC: 407 ug/dL (ref 250–450)
UIBC: 305 ug/dL

## 2023-10-28 LAB — FERRITIN: Ferritin: 236 ng/mL (ref 24–336)

## 2023-10-28 NOTE — Progress Notes (Signed)
Justin Berger [address], [place] [address]    Clinic Day:  10/28/2023  Referring physician: Doreatha Massed, MD  Patient Care Team: Doreatha Massed, MD as PCP - General (Hematology) Jena Gauss Gerrit Friends, MD as Consulting Physician (Gastroenterology) Doreatha Massed, MD as Consulting Physician (Hematology)   ASSESSMENT & PLAN:   Assessment: 1.  Essential thrombocytosis, CALR+ - High risk disease with history of thrombosis (left leg DVT) and age > 73 - Bone marrow biopsy 12/10/2017 by Dr. Ellin Saba at [facility] in [place], [place] Flow cytometty for leukemia/lymphoma was non-diagnostic Pathology review showed 60% cellularity with significant megakaryocytic hyperplasia with clusters of megakaryocytes; reticulin stain shows slight (Grade 1) reticulin fibrosis, some centered around areas of megakaryocyte clusters Cytogenic analysis showed normal male karyotype 46,XY[20] Noted to be at risk for progression to post essential thrombocythemia related myelofibrosis, but peripheral blood did not show other features of myelofibrosis, such as leukoerythroblastic blood or atypical megakaryocyte morphology - Intermittent bluish discoloration of bilateral palms, nonpainful.  He reports intermittent blurry vision.  Reports worsening daytime "sweating spells" and drenching night sweats. - Hydroxyurea started in December 2018 - current dose 1000 mg TTSun and 1500 mg MWFSat. - CT abdomen and pelvis on 09/29/2017 showed splenomegaly with spleen 14.5 cm.  Abdominal ultrasound (11/02/2021): Stable splenomegaly with spleen 14.3 cm.  MRI abdomen (11/20/2021): No splenomegaly noted.     2.  Myelodysplastic syndrome: - BMBX on 03/18/2023: Coexisting MDS with ring sideroblasts more than 15%, no increase in blasts.  MDS FISH panel and chromosome analysis were normal.  Serum EPO level 112.8.  NGS panel showed IDH 2, SF3 B1 and CALR mutations.    3.  Vitamin B12 and  folate deficiencies - Folic acid deficiency noted on 02/12/2022 with folate 5.3, normal homocystine - Borderline vitamin B12 noted on 02/12/2022 with B12 287 and normal MMA - Patient taking vitamin B12 500 mcg daily and folic acid 400 mcg daily  - Most recent labs (12/02/2022) with improved folate 17.4, vitamin B12 836, MMA normal.   4. 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  CALR positive essential thrombocytosis: - He is taking hydroxyurea 2 tablets daily. - Denies any vasomotor symptoms or aqua genic pruritus. - He reports decrease in energy every day after lunch.  Occasional daytime sweating episodes present.  Right chest wall pain on and off also reported. - At last visit, I have given Ritalin 5 mg trial.  He took it for 2 weeks but did not help. - [facility] - Reviewed labs today: Platelet count 467, improved from 501.  White count is normal.  Hemoglobin was normal.  LFTs were normal. - Continue Hydrea 2 tablets daily.  Continue aspirin 81 mg daily. - Will follow-up on testosterone levels. - RTC 12 weeks for follow-up.   2.  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 - Hemoglobin today improved to 12.4.  Ferritin is 236 and percent saturation 25.  Serum EPO level is 112. - If becomes anemic in the future, Luspatercept  is an option.  3.  Vitamin B12 and folate deficiencies  - Continue supplements.  B12 and folic acid were normal.     Orders Placed This Encounter  Procedures   CBC with Differential/Platelet    Standing Status:   Future    Standing Expiration  Date:   10/27/2024    Order Specific Question:   Release to patient    Answer:   Immediate   Comprehensive metabolic panel    Standing Status:   Future    Standing Expiration Date:   10/27/2024    Order Specific Question:   Release to patient    Answer:   Immediate   Lactate dehydrogenase    Standing Status:   Future    Standing Expiration Date:   10/27/2024    Order Specific Question:   Release to patient    Answer:   Immediate      I,Helena R Teague,acting as a scribe for Doreatha Massed, MD.,have documented all relevant documentation on the behalf of Doreatha Massed, MD,as directed by  Doreatha Massed, MD while in the presence of Doreatha Massed, MD.  I, Doreatha Massed MD, have reviewed the above documentation for accuracy and completeness, and I agree with the above.     Doreatha Massed, MD   10/29/20245:33 PM  CHIEF COMPLAINT:   Diagnosis: CALR+ essential thrombocytosis and iron deficiency anemia    Cancer Staging  No matching staging information was found for the patient.    Prior Therapy: Hydrea (1,000 mg Tu/Th/Su and 1,500 mg on Mo/We/Fr/Sat)   Current Therapy: Hydroxyurea 1000 mg daily.   HISTORY OF PRESENT ILLNESS:   Oncology History  Essential thrombocytosis (HCC)  08/31/2011 Cancer Diagnosis   History of colon cancer, status post resection in 2012, status post adjuvant chemotherapy with 6 months of 5-FU and leucovorin under the direction of Dr.Karb at [facility]   09/29/2017 Imaging   CT scan of the abdomen done for nonspecific abdominal pain shows incidental splenomegaly  Patient referred to [place] for further workup   10/30/2017 Genetic Testing   CALR mutation positive Jak 2 [id number] Negative   12/10/2017 Bone Marrow Biopsy   60% cellularity with significant megakaryocytic hyperplasia Grade 1 reticulin fibrosis Occasional hypo-lobulated megakaryocytes Flow cytometry with no significant abnormality Chromosome analysis 46, XY    12/10/2017 Initial Diagnosis   Essential thrombocytosis, CALR positive, high risk disease given his history of thrombosis and age more than 72  Hydroxyurea started on 12/12/2017      INTERVAL HISTORY:   Hau is a 72 y.o. male presenting to clinic today for follow up of CALR+ essential thrombocytosis and iron deficiency anemia. He was last seen by me on 07/29/23.  Today, he states that he is doing well overall. His appetite level is at 85%. His energy level is at 0%.  He reports decreased energy, particularly in the afternoons. When walking into his appointment room today, he states he was wobbly. He denies any aquagenic pruritus, fever, or night sweats. He has occasional daytime sweating episodes. He also reports intermittent right sided chest pain in the evenings. There is no tenderness upon palpation. He c/o pain in the right upper quadrant that occasionally moves to the right mid abdomen. His gall bladder was removed.   He notes that when walking he will occasionally lean to the left side and cannot straighten his body. There is associated band-like pain in the lower back.   His wife notes he took Ritalin for 2 weeks before discontinuing, as he did not tolerate  it well as it caused him decreased energy levels. He goes to the [facility] 3-4 times a week, using the elliptical, standing bike, or walking around a circuit.   PAST MEDICAL HISTORY:   Past Medical History: Past Medical History:  Diagnosis Date   AAA (abdominal aortic aneurysm) without rupture (HCC) 01/2021   Colon cancer (HCC) 2003   Diabetes (HCC)    Essential thrombocytosis (HCC)    Hypertension    Skin cancer, basal cell    back    Surgical History: Past Surgical History:  Procedure Laterality Date   BIOPSY  12/18/2021   Procedure: BIOPSY;  Surgeon: Dolores Frame, MD;  [facility];  Service: Gastroenterology;;   CATARACT EXTRACTION, BILATERAL     COLON SURGERY  06/23/2002   right colectomy    COLONOSCOPY  08/25/2020   Dr. Arlyn Dunning, normal colon other than venous lake in Left colon   COLONOSCOPY WITH PROPOFOL N/A 10/23/2021   Procedure: COLONOSCOPY WITH PROPOFOL;  Surgeon: Dolores Frame, MD;  [facility];  Service: Gastroenterology;  Laterality: N/A;  12:30   ESOPHAGEAL BANDING  10/23/2021   Procedure: ESOPHAGEAL BANDING;  Surgeon: Dolores Frame, MD;  [facility];  Service: Gastroenterology;;   ESOPHAGEAL BANDING  12/18/2021   Procedure: ESOPHAGEAL BANDING;  Surgeon: Marguerita Merles, Reuel Boom, MD;  [facility];  Service: Gastroenterology;;   ESOPHAGEAL BANDING  02/08/2022   Procedure: ESOPHAGEAL BANDING;  Surgeon: Marguerita Merles, Reuel Boom, MD;  [facility];  Service: Gastroenterology;;   ESOPHAGOGASTRODUODENOSCOPY  05/25/2021   [facility]: moderate inflammation characterized by adherent blood, erythema, and friability in gastric body, antrum and prepyloric region, as well as 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   ESOPHAGOGASTRODUODENOSCOPY (EGD) WITH PROPOFOL N/A 10/23/2021   Procedure: ESOPHAGOGASTRODUODENOSCOPY (EGD) WITH PROPOFOL;  Surgeon: Dolores Frame, MD;  [facility];  Service: Gastroenterology;  Laterality: N/A;   ESOPHAGOGASTRODUODENOSCOPY (EGD) WITH PROPOFOL N/A 12/18/2021   Procedure: ESOPHAGOGASTRODUODENOSCOPY (EGD) WITH PROPOFOL;  Surgeon: Dolores Frame, MD;  [facility];  Service: Gastroenterology;  Laterality: N/A;  8:05   ESOPHAGOGASTRODUODENOSCOPY (EGD) WITH PROPOFOL N/A 02/08/2022   Procedure: ESOPHAGOGASTRODUODENOSCOPY (EGD) WITH PROPOFOL;  Surgeon: Dolores Frame, MD;  [facility];  Service: Gastroenterology;  Laterality: N/A;  1055   ESOPHAGOGASTRODUODENOSCOPY (EGD) WITH PROPOFOL N/A 07/09/2022   Procedure: ESOPHAGOGASTRODUODENOSCOPY (EGD) WITH PROPOFOL;  Surgeon: Dolores Frame, MD;  [facility];  Service: Gastroenterology;  Laterality: N/A;  1130 ASA 2   ESOPHAGOGASTRODUODENOSCOPY (EGD) WITH PROPOFOL N/A 10/17/2022   Procedure: ESOPHAGOGASTRODUODENOSCOPY (EGD) WITH PROPOFOL;  Surgeon: Dolores Frame, MD;  [facility];  Service: Gastroenterology;  Laterality: N/A;  915 ASA 2   HEMOSTASIS CLIP PLACEMENT  02/08/2022   Procedure: HEMOSTASIS CLIP PLACEMENT;  Surgeon: Dolores Frame, MD;  [facility];  Service: Gastroenterology;;   IR BONE MARROW BIOPSY & ASPIRATION  03/18/2023   IR TRANSCATHETER BX  12/03/2021   IR US GUIDE VASC ACCESS RIGHT  12/03/2021   POLYPECTOMY  10/23/2021   Procedure: POLYPECTOMY INTESTINAL;  Surgeon: Dolores Frame, MD;  [facility];  Service: Gastroenterology;;   POLYPECTOMY  02/08/2022   Procedure: POLYPECTOMY;  Surgeon: Dolores Frame, MD;  [facility];  Service: Gastroenterology;;   TONSILLECTOMY      Social History: Social History   Socioeconomic History   Marital status: Married  Spouse name: Not on file   Number of children: Not on file   Years of education: Not on file   Highest education level: Not on file  Occupational History   Not on file  Tobacco Use   Smoking status: Former    Current packs/day: 0.00    Average packs/day: 0.5 packs/day for 3.0 years (1.5 ttl pk-yrs)    Types: Cigarettes    Start date: 04/09/1975    Quit date: 04/08/1978    Years since quitting: 45.5   Smokeless tobacco: Never  Vaping Use   Vaping status: Never Used  Substance and Sexual Activity   Alcohol use: Never   Drug use: Never   Sexual activity: Not on file  Other Topics Concern   Not on file  Social History Narrative   Not on file   Social Determinants of Health   Financial Resource Strain: Low Risk  (11/29/2020)   Overall Financial Resource Strain (CARDIA)    Difficulty of Paying Living Expenses: Not hard at all  Food  Insecurity: No Food Insecurity (11/29/2020)   Hunger Vital Sign    Worried About Running Out of Food in the Last Year: Never true    Ran Out of Food in the Last Year: Never true  Transportation Needs: No Transportation Needs (11/29/2020)   PRAPARE - Administrator, Civil Service (Medical): No    Lack of Transportation (Non-Medical): No  Physical Activity: Inactive (11/29/2020)   Exercise Vital Sign    Days of Exercise per Week: 0 days    Minutes of Exercise per Session: 0 min  Stress: No Stress Concern Present (11/29/2020)   [employer] of Occupational Health - Occupational Stress Questionnaire    Feeling of Stress : Not at all  Social Connections: Moderately Integrated (11/29/2020)   Social Connection and Isolation Panel [NHANES]    Frequency of Communication with Friends and Family: More than three times a week    Frequency of Social Gatherings with Friends and Family: Three times a week    Attends Religious Services: 1 to 4 times per year    Active Member of Clubs or Organizations: No    Attends Banker Meetings: Never    Marital Status: Married  Catering manager Violence: Not At Risk (11/29/2020)   Humiliation, Afraid, Rape, and Kick questionnaire    Fear of Current or Ex-Partner: No    Emotionally Abused: No    Physically Abused: No    Sexually Abused: No    Family History: Family History  Problem Relation Age of Onset   Dementia Mother    Cancer Paternal Uncle        lung    Current Medications:  Current Outpatient Medications:    ACCU-CHEK GUIDE test strip, SMARTSIG:Strip(s) Via Meter Daily, Disp: , Rfl:    Accu-Chek Softclix Lancets lancets, USE TO TEST BLOOD SUGAR DAILY, Disp: , Rfl:    acetaminophen (TYLENOL) 500 MG tablet, Take 1,000 mg by mouth every 6 (six) hours as needed for moderate pain or headache., Disp: , Rfl:    allopurinol (ZYLOPRIM) 300 MG tablet, Take 300 mg by mouth in the morning., Disp: , Rfl:    Ascorbic Acid (VITAMIN C  PO), Take 2,000 mg by mouth daily as needed (during winter season)., Disp: , Rfl:    aspirin 81 MG EC tablet, Take 81 mg by mouth in the morning., Disp: , Rfl:    atorvastatin (LIPITOR) 10 MG tablet, Take 10 mg by mouth every  Sunday., Disp: , Rfl:    Blood Glucose Monitoring Suppl (ACCU-CHEK GUIDE ME) w/Device KIT, USE TO TEST FASTING SUGAR ONCE DAILY, Disp: , Rfl:    Cholecalciferol (VITAMIN D3 PO), Take 2 capsules by mouth in the morning., Disp: , Rfl:    clonazePAM (KLONOPIN) 0.5 MG tablet, Take 0.5 mg by mouth daily as needed for anxiety., Disp: , Rfl:    DULoxetine (CYMBALTA) 60 MG capsule, Take 60 mg by mouth in the morning., Disp: , Rfl:    empagliflozin (JARDIANCE) 25 MG TABS tablet, Take by mouth., Disp: , Rfl:    fluticasone (FLONASE) 50 MCG/ACT nasal spray, Place 1 spray into both nostrils daily as needed for allergies or rhinitis., Disp: , Rfl:    gabapentin (NEURONTIN) 300 MG capsule, Take 900 mg by mouth at bedtime., Disp: , Rfl:    glipiZIDE (GLUCOTROL XL) 10 MG 24 hr tablet, Take 10 mg by mouth 2 (two) times daily., Disp: , Rfl:    hydroxyurea (HYDREA) 500 MG capsule, Take 2 capsules (1,000 mg total) by mouth 3 (three) times a week AND 3 capsules (1,500 mg total) 4 (four) times a week. [2 capsules on Tu/Th/Sun, 3 capsules on M/W/F/Sat]. May take with food to minimize GI side effects.., Disp: 204 capsule, Rfl: 3   loratadine (CLARITIN) 10 MG tablet, Take 10 mg by mouth daily as needed for allergies., Disp: , Rfl:    metFORMIN (GLUCOPHAGE-XR) 500 MG 24 hr tablet, Take 1,000 mg by mouth in the morning and at bedtime. 2 in the morning and 2 at  bedtime, Disp: , Rfl: 6   methylphenidate (RITALIN) 5 MG tablet, Take 1 tablet (5 mg total) by mouth daily as needed., Disp: 30 tablet, Rfl: 0   metoprolol succinate (TOPROL-XL) 50 MG 24 hr tablet, Take 50 mg by mouth every evening., Disp: , Rfl:    Multiple Vitamin (MULTIVITAMIN WITH MINERALS) TABS tablet, Take 1 tablet by mouth in the morning.,  Disp: , Rfl:    omeprazole (PRILOSEC) 40 MG capsule, Take 1 capsule (40 mg total) by mouth daily., Disp: 90 capsule, Rfl: 3   sucralfate (CARAFATE) 1 g tablet, Take 1 tablet (1 g total) by mouth 2 (two) times daily., Disp: 28 tablet, Rfl: 0   tadalafil (CIALIS) 20 MG tablet, Take 20 mg by mouth daily as needed for erectile dysfunction., Disp: , Rfl:    zinc gluconate 50 MG tablet, Take 50 mg by mouth daily as needed (during the winter season)., Disp: , Rfl:    Allergies: Allergies  Allergen Reactions   Ivp Dye  [Iodinated Contrast Media]     Heart stopped     REVIEW OF SYSTEMS:   Review of Systems  Constitutional:  Negative for chills, fatigue and fever.  HENT:   Positive for trouble swallowing. Negative for lump/mass, mouth sores, nosebleeds and sore throat.   Eyes:  Negative for eye problems.  Respiratory:  Positive for cough and shortness of breath.   Cardiovascular:  Positive for chest pain. Negative for leg swelling and palpitations.  Gastrointestinal:  Negative for abdominal pain, constipation, diarrhea, nausea and vomiting.  Genitourinary:  Negative for bladder incontinence, difficulty urinating, dysuria, frequency, hematuria and nocturia.   Musculoskeletal:  Positive for flank pain (right side under ribs, 5/10 severity). Negative for arthralgias, back pain, myalgias and neck pain.  Skin:  Negative for itching and rash.  Neurological:  Positive for dizziness and numbness (on face). Negative for headaches.  Hematological:  Does not bruise/bleed easily.  Psychiatric/Behavioral:  Positive for depression. Negative for sleep disturbance and suicidal ideas. The patient is nervous/anxious.   All other systems reviewed and are negative.    VITALS:   Blood pressure 137/85, pulse 72, temperature 98.4 F (36.9 C), temperature source Oral, resp. rate 18, weight 235 lb 8 oz (106.8 kg), SpO2 96%.  Wt Readings from Last 3 Encounters:  10/28/23 235 lb 8 oz (106.8 kg)  05/19/23 235 lb 6.4  oz (106.8 kg)  04/30/23 235 lb 14.3 oz (107 kg)    Body mass index is 33.79 kg/m.  Performance status (ECOG): 1 - Symptomatic but completely ambulatory  PHYSICAL EXAM:   Physical Exam Vitals and nursing note reviewed. Exam conducted with a chaperone present.  Constitutional:      Appearance: Normal appearance.  Cardiovascular:     Rate and Rhythm: Normal rate and regular rhythm.     Pulses: Normal pulses.     Heart sounds: Normal heart sounds.  Pulmonary:     Effort: Pulmonary effort is normal.     Breath sounds: Normal breath sounds.  Abdominal:     Palpations: Abdomen is soft. There is no hepatomegaly, splenomegaly or mass.     Tenderness: There is no abdominal tenderness.  Musculoskeletal:     Right lower leg: No edema.     Left lower leg: No edema.  Lymphadenopathy:     Cervical: No cervical adenopathy.     Right cervical: No superficial, deep or posterior cervical adenopathy.    Left cervical: No superficial, deep or posterior cervical adenopathy.     Upper Body:     Right upper body: No supraclavicular or axillary adenopathy.     Left upper body: No supraclavicular or axillary adenopathy.  Neurological:     General: No focal deficit present.     Mental Status: He is alert and oriented to person, place, and time.  Psychiatric:        Mood and Affect: Mood normal.        Behavior: Behavior normal.     LABS:      Latest Ref Rng & Units 10/28/2023    2:22 PM 07/29/2023   12:52 PM 06/16/2023    1:34 PM  CBC  WBC 4.0 - 10.5 K/uL 6.2  6.1  5.2   Hemoglobin 13.0 - 17.0 g/dL 82.9  56.2  13.0   Hematocrit 39.0 - 52.0 % 40.5  38.2  36.0   Platelets 150 - 400 K/uL 467  501  565       Latest Ref Rng & Units 10/28/2023    2:22 PM 03/04/2023    8:25 AM 12/02/2022    8:31 AM  CMP  Glucose 70 - 99 mg/dL 865  784  696   BUN 8 - 23 mg/dL 16  18  12    Creatinine 0.61 - 1.24 mg/dL 2.95  2.84  1.32   Sodium 135 - 145 mmol/L 138  137  139   Potassium 3.5 - 5.1 mmol/L 4.0   4.0  3.9   Chloride 98 - 111 mmol/L 104  105  106   CO2 22 - 32 mmol/L 24  22  24    Calcium 8.9 - 10.3 mg/dL 9.5  9.3  9.0   Total Protein 6.5 - 8.1 g/dL 6.9  6.6  6.7   Total Bilirubin 0.3 - 1.2 mg/dL 0.8  0.9  0.7   Alkaline Phos 38 - 126 U/L 46  43  35   AST 15 - 41 U/L  15  17  21    ALT 0 - 44 U/L 18  17  17       No results found for: "CEA1", "CEA" / No results found for: "CEA1", "CEA" No results found for: "PSA1" No results found for: "CAN199" No results found for: "CAN125"  Lab Results  Component Value Date   TOTALPROTELP 5.8 (L) 04/27/2021   TOTALPROTELP 5.7 (L) 04/27/2021   ALBUMINELP 3.6 04/27/2021   A1GS 0.2 04/27/2021   A2GS 0.7 04/27/2021   BETS 0.9 04/27/2021   GAMS 0.4 04/27/2021   MSPIKE Not Observed 04/27/2021   SPEI Comment 04/27/2021   Lab Results  Component Value Date   TIBC 407 10/28/2023   TIBC 372 07/29/2023   TIBC 351 06/16/2023   FERRITIN 236 10/28/2023   FERRITIN 196 07/29/2023   FERRITIN 243 06/16/2023   IRONPCTSAT 25 10/28/2023   IRONPCTSAT 29 07/29/2023   IRONPCTSAT 28 06/16/2023   Lab Results  Component Value Date   LDH 212 (H) 07/29/2023   LDH 221 (H) 06/16/2023   LDH 172 04/30/2023     STUDIES:   No results found.

## 2023-10-28 NOTE — Patient Instructions (Addendum)
[facility] - [facility]  Discharge Instructions  You were seen and examined today by Dr. Ellin Saba.  Dr. Ellin Saba discussed your most recent lab work which revealed that everything looks good and stable that is back so far.   Dr. Ellin Saba will have someone call you with results of the other labs that are not back yet.  Follow-up as scheduled in 12 weeks.    Thank you for choosing [facility] to provide your oncology and hematology care.   To afford each patient quality time with our provider, please arrive at least 15 minutes before your scheduled appointment time. You may need to reschedule your appointment if you arrive late (10 or more minutes). Arriving late affects you and other patients whose appointments are after yours.  Also, if you miss three or more appointments without notifying the office, you may be dismissed from the clinic at the provider's discretion.    Again, thank you for choosing [facility].  Our hope is that these requests will decrease the amount of time that you wait before being seen by our physicians.   If you have a lab appointment with the Cancer Center - please note that after April 8th, all labs will be drawn in the cancer center.  You do not have to check in or register with the main entrance as you have in the past but will complete your check-in at the cancer center.            _____________________________________________________________  Should you have questions after your visit to [facility], please contact our office at [phone] and follow the prompts.  Our office hours are 8:00 a.m. to 4:30 p.m. Monday - Thursday and 8:00 a.m. to 2:30 p.m. Friday.  Please note that voicemails left after 4:00 p.m. may not be returned until the following business day.  We are closed weekends and all major holidays.  You do have access to a nurse 24-7, just call the main number to the clinic  [phone] and do not press any options, hold on the line and a nurse will answer the phone.    For prescription refill requests, have your pharmacy contact our office and allow 72 hours.    Masks are no longer required in the cancer centers. If you would like for your care team to wear a mask while they are taking care of you, please let them know. You may have one support person who is at least 73 years old accompany you for your appointments.

## 2023-10-29 LAB — TESTOSTERONE: Testosterone: 324 ng/dL (ref 264–916)

## 2023-11-01 DIAGNOSIS — L720 Epidermal cyst: Secondary | ICD-10-CM | POA: Diagnosis not present

## 2023-11-01 DIAGNOSIS — C44319 Basal cell carcinoma of skin of other parts of face: Secondary | ICD-10-CM | POA: Diagnosis not present

## 2023-11-01 DIAGNOSIS — L570 Actinic keratosis: Secondary | ICD-10-CM | POA: Diagnosis not present

## 2023-11-28 DIAGNOSIS — E782 Mixed hyperlipidemia: Secondary | ICD-10-CM | POA: Diagnosis not present

## 2023-11-28 DIAGNOSIS — D469 Myelodysplastic syndrome, unspecified: Secondary | ICD-10-CM | POA: Diagnosis not present

## 2023-11-28 DIAGNOSIS — I10 Essential (primary) hypertension: Secondary | ICD-10-CM | POA: Diagnosis not present

## 2023-11-28 DIAGNOSIS — D649 Anemia, unspecified: Secondary | ICD-10-CM | POA: Diagnosis not present

## 2023-11-28 DIAGNOSIS — E1142 Type 2 diabetes mellitus with diabetic polyneuropathy: Secondary | ICD-10-CM | POA: Diagnosis not present

## 2023-11-28 DIAGNOSIS — N189 Chronic kidney disease, unspecified: Secondary | ICD-10-CM | POA: Diagnosis not present

## 2023-12-03 DIAGNOSIS — E119 Type 2 diabetes mellitus without complications: Secondary | ICD-10-CM | POA: Diagnosis not present

## 2023-12-03 DIAGNOSIS — H34832 Tributary (branch) retinal vein occlusion, left eye, with macular edema: Secondary | ICD-10-CM | POA: Diagnosis not present

## 2023-12-03 DIAGNOSIS — H40013 Open angle with borderline findings, low risk, bilateral: Secondary | ICD-10-CM | POA: Diagnosis not present

## 2024-01-20 ENCOUNTER — Inpatient Hospital Stay: Payer: Medicare Other

## 2024-01-20 ENCOUNTER — Inpatient Hospital Stay: Payer: Medicare Other | Attending: Physician Assistant | Admitting: Hematology

## 2024-01-20 VITALS — BP 120/78 | HR 74 | Temp 98.0°F | Resp 18 | Wt 233.8 lb

## 2024-01-20 DIAGNOSIS — Z85038 Personal history of other malignant neoplasm of large intestine: Secondary | ICD-10-CM | POA: Insufficient documentation

## 2024-01-20 DIAGNOSIS — R42 Dizziness and giddiness: Secondary | ICD-10-CM

## 2024-01-20 DIAGNOSIS — E538 Deficiency of other specified B group vitamins: Secondary | ICD-10-CM | POA: Diagnosis not present

## 2024-01-20 DIAGNOSIS — D473 Essential (hemorrhagic) thrombocythemia: Secondary | ICD-10-CM | POA: Diagnosis not present

## 2024-01-20 DIAGNOSIS — R2689 Other abnormalities of gait and mobility: Secondary | ICD-10-CM

## 2024-01-20 DIAGNOSIS — Z9221 Personal history of antineoplastic chemotherapy: Secondary | ICD-10-CM | POA: Diagnosis not present

## 2024-01-20 DIAGNOSIS — E119 Type 2 diabetes mellitus without complications: Secondary | ICD-10-CM | POA: Insufficient documentation

## 2024-01-20 DIAGNOSIS — D539 Nutritional anemia, unspecified: Secondary | ICD-10-CM

## 2024-01-20 DIAGNOSIS — Z7984 Long term (current) use of oral hypoglycemic drugs: Secondary | ICD-10-CM | POA: Diagnosis not present

## 2024-01-20 DIAGNOSIS — D500 Iron deficiency anemia secondary to blood loss (chronic): Secondary | ICD-10-CM

## 2024-01-20 DIAGNOSIS — Z79899 Other long term (current) drug therapy: Secondary | ICD-10-CM | POA: Insufficient documentation

## 2024-01-20 DIAGNOSIS — Z87891 Personal history of nicotine dependence: Secondary | ICD-10-CM | POA: Diagnosis not present

## 2024-01-20 DIAGNOSIS — Z801 Family history of malignant neoplasm of trachea, bronchus and lung: Secondary | ICD-10-CM | POA: Diagnosis not present

## 2024-01-20 DIAGNOSIS — Z85820 Personal history of malignant melanoma of skin: Secondary | ICD-10-CM | POA: Diagnosis not present

## 2024-01-20 DIAGNOSIS — D469 Myelodysplastic syndrome, unspecified: Secondary | ICD-10-CM | POA: Diagnosis not present

## 2024-01-20 LAB — COMPREHENSIVE METABOLIC PANEL
ALT: 19 U/L (ref 0–44)
AST: 15 U/L (ref 15–41)
Albumin: 4.7 g/dL (ref 3.5–5.0)
Alkaline Phosphatase: 55 U/L (ref 38–126)
Anion gap: 12 (ref 5–15)
BUN: 16 mg/dL (ref 8–23)
CO2: 24 mmol/L (ref 22–32)
Calcium: 9.6 mg/dL (ref 8.9–10.3)
Chloride: 102 mmol/L (ref 98–111)
Creatinine, Ser: 1.27 mg/dL — ABNORMAL HIGH (ref 0.61–1.24)
GFR, Estimated: 60 mL/min — ABNORMAL LOW (ref >60)
Glucose, Bld: 202 mg/dL — ABNORMAL HIGH (ref 70–99)
Potassium: 3.9 mmol/L (ref 3.5–5.1)
Sodium: 138 mmol/L (ref 135–145)
Total Bilirubin: 0.8 mg/dL (ref 0.0–1.2)
Total Protein: 6.9 g/dL (ref 6.5–8.1)

## 2024-01-20 LAB — CBC WITH DIFFERENTIAL/PLATELET
Abs Immature Granulocytes: 0 10*3/uL (ref 0.00–0.07)
Band Neutrophils: 1 %
Basophils Absolute: 0.1 10*3/uL (ref 0.0–0.1)
Basophils Relative: 2 %
Eosinophils Absolute: 0.1 10*3/uL (ref 0.0–0.5)
Eosinophils Relative: 2 %
HCT: 40.7 % (ref 39.0–52.0)
Hemoglobin: 12.8 g/dL — ABNORMAL LOW (ref 13.0–17.0)
Lymphocytes Relative: 28 %
Lymphs Abs: 1.8 10*3/uL (ref 0.7–4.0)
MCH: 32.6 pg (ref 26.0–34.0)
MCHC: 31.4 g/dL (ref 30.0–36.0)
MCV: 103.6 fL — ABNORMAL HIGH (ref 80.0–100.0)
Monocytes Absolute: 0.8 10*3/uL (ref 0.1–1.0)
Monocytes Relative: 13 %
Neutro Abs: 3.6 10*3/uL (ref 1.7–7.7)
Neutrophils Relative %: 54 %
Platelets: 487 10*3/uL — ABNORMAL HIGH (ref 150–400)
RBC: 3.93 MIL/uL — ABNORMAL LOW (ref 4.22–5.81)
RDW: 17.9 % — ABNORMAL HIGH (ref 11.5–15.5)
WBC: 6.5 10*3/uL (ref 4.0–10.5)
nRBC: 1.9 % — ABNORMAL HIGH (ref 0.0–0.2)

## 2024-01-20 LAB — LACTATE DEHYDROGENASE: LDH: 245 U/L — ABNORMAL HIGH (ref 98–192)

## 2024-01-20 NOTE — Patient Instructions (Signed)
[facility] at [facility] Discharge Instructions   You were seen and examined today by Dr. Ellin Saba.  He reviewed the results of your lab work which are normal/stable.   Continue Hydrea as prescribed.   Return as scheduled.    Thank you for choosing [facility] at [facility] to provide your oncology and hematology care.  To afford each patient quality time with our provider, please arrive at least 15 minutes before your scheduled appointment time.   If you have a lab appointment with the Cancer Center please come in thru the Main Entrance and check in at the main information desk.  You need to re-schedule your appointment should you arrive 10 or more minutes late.  We strive to give you quality time with our providers, and arriving late affects you and other patients whose appointments are after yours.  Also, if you no show three or more times for appointments you may be dismissed from the clinic at the providers discretion.     Again, thank you for choosing [facility].  Our hope is that these requests will decrease the amount of time that you wait before being seen by our physicians.       _____________________________________________________________  Should you have questions after your visit to [facility], please contact our office at [phone] and follow the prompts.  Our office hours are 8:00 a.m. and 4:30 p.m. Monday - Friday.  Please note that voicemails left after 4:00 p.m. may not be returned until the following business day.  We are closed weekends and major holidays.  You do have access to a nurse 24-7, just call the main number to the clinic [phone] and do not press any options, hold on the line and a nurse will answer the phone.    For prescription refill requests, have your pharmacy contact our office and allow 72 hours.    Due to Covid, you will need to wear a mask upon entering the hospital.  If you do not have a mask, a mask will be given to you at the Main Entrance upon arrival. For doctor visits, patients may have 1 support person age 64 or older with them. For treatment visits, patients can not have anyone with them due to social distancing guidelines and our immunocompromised population.

## 2024-01-20 NOTE — Progress Notes (Signed)
[facility] [address] [place] [address]    Clinic Day:  01/20/2024  Referring physician: Doreatha Massed, MD  Patient Care Team: Doreatha Massed, MD as PCP - General (Hematology) Jena Gauss Gerrit Friends, MD as Consulting Physician (Gastroenterology) Doreatha Massed, MD as Consulting Physician (Hematology)   ASSESSMENT & PLAN:   Assessment: 1.  Essential thrombocytosis, CALR+ - High risk disease with history of thrombosis (left leg DVT) and age > 22 - Bone marrow biopsy 12/10/2017 by Dr. Ellin Saba at [facility] in [place], [place] Flow cytometty for leukemia/lymphoma was non-diagnostic Pathology review showed 60% cellularity with significant megakaryocytic hyperplasia with clusters of megakaryocytes; reticulin stain shows slight (Grade 1) reticulin fibrosis, some centered around areas of megakaryocyte clusters Cytogenic analysis showed normal male karyotype 46,XY[20] Noted to be at risk for progression to post essential thrombocythemia related myelofibrosis, but peripheral blood did not show other features of myelofibrosis, such as leukoerythroblastic blood or atypical megakaryocyte morphology - Intermittent bluish discoloration of bilateral palms, nonpainful.  Justin Berger reports intermittent blurry vision.  Reports worsening daytime "sweating spells" and drenching night sweats. - Hydroxyurea started in December 2018 - current dose 1000 mg TTSun and 1500 mg MWFSat. - CT abdomen and pelvis on 09/29/2017 showed splenomegaly with spleen 14.5 cm.  Abdominal ultrasound (11/02/2021): Stable splenomegaly with spleen 14.3 cm.  MRI abdomen (11/20/2021): No splenomegaly noted.     2.  Myelodysplastic syndrome: - BMBX on 03/18/2023: Coexisting MDS with ring sideroblasts more than 15%, no increase in blasts.  MDS FISH panel and chromosome analysis were normal.  Serum EPO level 112.8.  NGS panel showed IDH 2, SF3 B1 and CALR mutations.    3.  Vitamin B12 and  folate deficiencies - Folic acid deficiency noted on 02/12/2022 with folate 5.3, normal homocystine - Borderline vitamin B12 noted on 02/12/2022 with B12 287 and normal MMA - Patient taking vitamin B12 500 mcg daily and folic acid 400 mcg daily  - Most recent labs (12/02/2022) with improved folate 17.4, vitamin B12 836, MMA normal.   4. 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  CALR positive essential thrombocytosis: - Justin Berger is taking hydroxyurea 2 tablets daily. - Denies any aquagenic pruritus or vasomotor symptoms.  No prior history of thrombosis.  Justin Berger will continue aspirin 81 mg daily. - Reports feeling off balance all the time but worse in the evenings. - She does have occasional numbness in the hands more than feet. - Justin Berger has been off of metformin and Ritalin. - Recommend MRI of the brain with and without contrast.  Will arrange for follow-up phone visit after that.  If there is no significant pathology on the MRI, we will refer to neurology for further workup. - RTC 12 weeks for follow-up with repeat labs.   2.  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 - Hemoglobin today is 12.8.  Ferritin is 236 and percent saturation 25. - If there is any significant anemia, consider Luspatercept.    3.  Vitamin B12 and folate deficiencies  - Continue supplements.  Last B12 and folic acid were normal.  Orders Placed This Encounter  Procedures   MR Brain W Wo Contrast    Standing Status:   Future    Expected Date:   01/27/2024    Expiration Date:   01/19/2025    If indicated for the ordered procedure, I  authorize the administration of contrast media per Radiology protocol:   Yes    What is the patient's sedation requirement?:   No Sedation    Does the patient have a pacemaker or implanted devices?:   No    Use SRS Protocol?:   No    Preferred imaging location?:   [facility] (table limit - 550lbs)   Comprehensive metabolic panel    Standing Status:   Future    Expected Date:   04/13/2024    Expiration Date:   01/19/2025   Lactate dehydrogenase    Standing Status:   Future    Expected Date:   04/13/2024    Expiration Date:   01/19/2025   Iron and TIBC ([facility] ONLY)    Standing Status:   Future    Expected Date:   04/13/2024    Expiration Date:   01/19/2025   Ferritin    Standing Status:   Future    Expected Date:   04/13/2024    Expiration Date:   01/19/2025      Mikeal Hawthorne R Teague,acting as a scribe for Doreatha Massed, MD.,have documented all relevant documentation on the behalf of Doreatha Massed, MD,as directed by  Doreatha Massed, MD while in the presence of Doreatha Massed, MD.  I, Doreatha Massed MD, have reviewed the above documentation for accuracy and completeness, and I agree with the above.     Doreatha Massed, MD   1/21/20253:40 PM  CHIEF COMPLAINT:   Diagnosis: CALR+ essential thrombocytosis and iron deficiency anemia    Cancer Staging  No matching staging information was found for the patient.    Prior Therapy: Hydrea (1,000 mg Tu/Th/Su and 1,500 mg on Mo/We/Fr/Sat)   Current Therapy: Hydroxyurea 1000 mg daily.   HISTORY OF PRESENT ILLNESS:   Oncology History  Essential thrombocytosis (HCC)  08/31/2011 Cancer Diagnosis   History of colon cancer, status post resection in 2012, status post adjuvant chemotherapy with 6 months of 5-FU and leucovorin under the direction of Dr.Karb at [facility]   09/29/2017 Imaging   CT scan of the abdomen done for nonspecific abdominal pain shows incidental  splenomegaly  Patient referred to [place] for further workup   10/30/2017 Genetic Testing   CALR mutation positive Jak 2 [id number] Negative   12/10/2017 Bone Marrow Biopsy   60% cellularity with significant megakaryocytic hyperplasia Grade 1 reticulin fibrosis Occasional hypo-lobulated megakaryocytes Flow cytometry with no significant abnormality Chromosome analysis 46, XY   12/10/2017 Initial Diagnosis   Essential thrombocytosis, CALR positive, high risk disease given his history of thrombosis and age more than 60  Hydroxyurea started on 12/12/2017      INTERVAL HISTORY:   Justin Berger is a 73 y.o. male presenting to clinic today for follow up of CALR+ essential thrombocytosis and iron deficiency anemia. Justin Berger was last seen by me on 10/28/23.  Today, Justin Berger states that Justin Berger is doing well overall. His appetite level is at 100%. His energy level is at 0%. She is accompanied by his wife.   Justin Berger reports his energy levels is low in the afternoon and evening.  Justin Berger has not been able to go to the gym 4 x a week. Justin Berger denies any aquagenic pruritus. Justin Berger  notes after a shower his fingers will be blue in color. Justin Berger is taking baby ASA, hydroxyurea, Vitamin B 12, and folic acid supplements as prescribed.  Justin Berger reports an unsteady gait and being off balance throughout the day when walking, worsened in the evenings. Justin Berger has been hitting doors and walls when walking around his house. Justin Berger is unaware if this is due to DM type 2. Justin Berger occasionally has difficulty walking and states Justin Berger has to think for long periods of time about taking a step or talking. Justin Berger also notes dysphagia when swallowing pills.   Justin Berger reports intermittent numbness and tingling in the hands and feet, worsened in the hands. His dosage of Jardiance has not been increased after discontinuing metformin. Justin Berger is unable to tolerate CT dye, as it caused asystole.   PAST MEDICAL HISTORY:   Past Medical History: Past Medical History:  Diagnosis Date   AAA (abdominal aortic  aneurysm) without rupture (HCC) 01/2021   Colon cancer (HCC) 2003   Diabetes (HCC)    Essential thrombocytosis (HCC)    Hypertension    Skin cancer, basal cell    back    Surgical History: Past Surgical History:  Procedure Laterality Date   BIOPSY  12/18/2021   Procedure: BIOPSY;  Surgeon: Dolores Frame, MD;  [facility];  Service: Gastroenterology;;   CATARACT EXTRACTION, BILATERAL     COLON SURGERY  06/23/2002   right colectomy   COLONOSCOPY  08/25/2020   Dr. Arlyn Dunning, normal colon other than venous lake in Left colon   COLONOSCOPY WITH PROPOFOL N/A 10/23/2021   Procedure: COLONOSCOPY WITH PROPOFOL;  Surgeon: Dolores Frame, MD;  [facility];  Service: Gastroenterology;  Laterality: N/A;  12:30   ESOPHAGEAL BANDING  10/23/2021   Procedure: ESOPHAGEAL BANDING;  Surgeon: Dolores Frame, MD;  [facility];  Service: Gastroenterology;;   ESOPHAGEAL BANDING  12/18/2021   Procedure: ESOPHAGEAL BANDING;  Surgeon: Marguerita Merles, Reuel Boom, MD;  [facility];  Service: Gastroenterology;;   ESOPHAGEAL BANDING  02/08/2022   Procedure: ESOPHAGEAL BANDING;  Surgeon: Marguerita Merles, Reuel Boom, MD;  [facility];  Service: Gastroenterology;;   ESOPHAGOGASTRODUODENOSCOPY  05/25/2021   [facility]: moderate inflammation characterized by adherent blood, erythema, and friability in gastric body, antrum and prepyloric region, as well as 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   ESOPHAGOGASTRODUODENOSCOPY (EGD) WITH PROPOFOL N/A 10/23/2021   Procedure: ESOPHAGOGASTRODUODENOSCOPY (EGD) WITH PROPOFOL;  Surgeon: Dolores Frame, MD;  [facility];  Service: Gastroenterology;  Laterality: N/A;   ESOPHAGOGASTRODUODENOSCOPY (EGD) WITH PROPOFOL N/A 12/18/2021   Procedure: ESOPHAGOGASTRODUODENOSCOPY (EGD) WITH PROPOFOL;  Surgeon: Dolores Frame, MD;   [facility];  Service: Gastroenterology;  Laterality: N/A;  8:05   ESOPHAGOGASTRODUODENOSCOPY (EGD) WITH PROPOFOL N/A 02/08/2022   Procedure: ESOPHAGOGASTRODUODENOSCOPY (EGD) WITH PROPOFOL;  Surgeon: Dolores Frame, MD;  [facility];  Service: Gastroenterology;  Laterality: N/A;  1055   ESOPHAGOGASTRODUODENOSCOPY (EGD) WITH PROPOFOL N/A 07/09/2022   Procedure: ESOPHAGOGASTRODUODENOSCOPY (EGD) WITH PROPOFOL;  Surgeon: Dolores Frame, MD;  [facility];  Service: Gastroenterology;  Laterality: N/A;  1130 ASA 2   ESOPHAGOGASTRODUODENOSCOPY (EGD) WITH PROPOFOL N/A 10/17/2022   Procedure: ESOPHAGOGASTRODUODENOSCOPY (EGD) WITH PROPOFOL;  Surgeon: Dolores Frame, MD;  [facility];  Service: Gastroenterology;  Laterality: N/A;  915 ASA 2   HEMOSTASIS CLIP PLACEMENT  02/08/2022   Procedure: HEMOSTASIS CLIP PLACEMENT;  Surgeon: Marguerita Merles, Reuel Boom, MD;  [facility];  Service: Gastroenterology;;   IR BONE MARROW BIOPSY & ASPIRATION  03/18/2023   IR TRANSCATHETER BX  12/03/2021   IR US GUIDE VASC ACCESS RIGHT  12/03/2021   POLYPECTOMY  10/23/2021   Procedure: POLYPECTOMY INTESTINAL;  Surgeon: Dolores Frame, MD;  [facility];  Service: Gastroenterology;;   POLYPECTOMY  02/08/2022   Procedure: POLYPECTOMY;  Surgeon: Marguerita Merles, Reuel Boom, MD;  [facility];  Service: Gastroenterology;;   TONSILLECTOMY      Social History: Social History   Socioeconomic History   Marital status: Married    Spouse name: Not on file   Number of children: Not on file   Years of education: Not on file   Highest education level: Not on file  Occupational History   Not on file  Tobacco Use   Smoking status: Former    Current packs/day: 0.00    Average packs/day: 0.5 packs/day for 3.0 years (1.5 ttl pk-yrs)    Types: Cigarettes    Start date: 04/09/1975    Quit date: 04/08/1978    Years  since quitting: 45.8   Smokeless tobacco: Never  Vaping Use   Vaping status: Never Used  Substance and Sexual Activity   Alcohol use: Never   Drug use: Never   Sexual activity: Not on file  Other Topics Concern   Not on file  Social History Narrative   Not on file   Social Drivers of Health   Financial Resource Strain: Low Risk  (11/29/2020)   Overall Financial Resource Strain (CARDIA)    Difficulty of Paying Living Expenses: Not hard at all  Food Insecurity: No Food Insecurity (11/29/2020)   Hunger Vital Sign    Worried About Running Out of Food in the Last Year: Never true    Ran Out of Food in the Last Year: Never true  Transportation Needs: No Transportation Needs (11/29/2020)   PRAPARE - Administrator, Civil Service (Medical): No    Lack of Transportation (Non-Medical): No  Physical Activity: Inactive (11/29/2020)   Exercise Vital Sign    Days of Exercise per Week: 0 days    Minutes of Exercise per Session: 0 min  Stress: No Stress Concern Present (11/29/2020)   [employer] of Occupational Health - Occupational Stress Questionnaire    Feeling of Stress : Not at all  Social Connections: Moderately Integrated (11/29/2020)   Social Connection and Isolation Panel [NHANES]    Frequency of Communication with Friends and Family: More than three times a week    Frequency of Social Gatherings with Friends and Family: Three times a week    Attends Religious Services: 1 to 4 times per year    Active Member of Clubs or Organizations: No    Attends Banker Meetings: Never    Marital Status: Married  Catering manager Violence: Not At Risk (11/29/2020)   Humiliation, Afraid, Rape, and Kick questionnaire    Fear of Current or Ex-Partner: No    Emotionally Abused: No    Physically Abused: No    Sexually Abused: No    Family History: Family History  Problem Relation Age of Onset   Dementia Mother    Cancer Paternal Uncle        lung    Current  Medications:  Current Outpatient Medications:    ACCU-CHEK GUIDE test strip, SMARTSIG:Strip(s) Via Meter Daily, Disp: , Rfl:    Accu-Chek Softclix Lancets  lancets, USE TO TEST BLOOD SUGAR DAILY, Disp: , Rfl:    acetaminophen (TYLENOL) 500 MG tablet, Take 1,000 mg by mouth every 6 (six) hours as needed for moderate pain or headache., Disp: , Rfl:    allopurinol (ZYLOPRIM) 300 MG tablet, Take 300 mg by mouth in the morning., Disp: , Rfl:    Ascorbic Acid (VITAMIN C PO), Take 2,000 mg by mouth daily as needed (during winter season)., Disp: , Rfl:    aspirin 81 MG EC tablet, Take 81 mg by mouth in the morning., Disp: , Rfl:    atorvastatin (LIPITOR) 10 MG tablet, Take 10 mg by mouth every Sunday., Disp: , Rfl:    Blood Glucose Monitoring Suppl (ACCU-CHEK GUIDE ME) w/Device KIT, USE TO TEST FASTING SUGAR ONCE DAILY, Disp: , Rfl:    Cholecalciferol (VITAMIN D3 PO), Take 2 capsules by mouth in the morning., Disp: , Rfl:    clonazePAM (KLONOPIN) 0.5 MG tablet, Take 0.5 mg by mouth daily as needed for anxiety., Disp: , Rfl:    DULoxetine (CYMBALTA) 60 MG capsule, Take 60 mg by mouth in the morning., Disp: , Rfl:    empagliflozin (JARDIANCE) 25 MG TABS tablet, Take by mouth., Disp: , Rfl:    fluticasone (FLONASE) 50 MCG/ACT nasal spray, Place 1 spray into both nostrils daily as needed for allergies or rhinitis., Disp: , Rfl:    gabapentin (NEURONTIN) 300 MG capsule, Take 900 mg by mouth at bedtime., Disp: , Rfl:    glipiZIDE (GLUCOTROL XL) 10 MG 24 hr tablet, Take 10 mg by mouth 2 (two) times daily., Disp: , Rfl:    hydroxyurea (HYDREA) 500 MG capsule, Take 2 capsules (1,000 mg total) by mouth 3 (three) times a week AND 3 capsules (1,500 mg total) 4 (four) times a week. [2 capsules on Tu/Th/Sun, 3 capsules on M/W/F/Sat]. May take with food to minimize GI side effects.., Disp: 204 capsule, Rfl: 3   loratadine (CLARITIN) 10 MG tablet, Take 10 mg by mouth daily as needed for allergies., Disp: , Rfl:     metoprolol succinate (TOPROL-XL) 50 MG 24 hr tablet, Take 50 mg by mouth every evening., Disp: , Rfl:    Multiple Vitamin (MULTIVITAMIN WITH MINERALS) TABS tablet, Take 1 tablet by mouth in the morning., Disp: , Rfl:    omeprazole (PRILOSEC) 40 MG capsule, Take 1 capsule (40 mg total) by mouth daily., Disp: 90 capsule, Rfl: 3   sucralfate (CARAFATE) 1 g tablet, Take 1 tablet (1 g total) by mouth 2 (two) times daily., Disp: 28 tablet, Rfl: 0   tadalafil (CIALIS) 20 MG tablet, Take 20 mg by mouth daily as needed for erectile dysfunction., Disp: , Rfl:    zinc gluconate 50 MG tablet, Take 50 mg by mouth daily as needed (during the winter season)., Disp: , Rfl:    Allergies: Allergies  Allergen Reactions   Ivp Dye  [Iodinated Contrast Media]     Heart stopped     REVIEW OF SYSTEMS:   Review of Systems  Constitutional:  Negative for chills, fatigue and fever.  HENT:   Positive for trouble swallowing. Negative for lump/mass, mouth sores, nosebleeds and sore throat.   Eyes:  Negative for eye problems.  Respiratory:  Positive for shortness of breath. Negative for cough.   Cardiovascular:  Positive for chest pain. Negative for leg swelling and palpitations.  Gastrointestinal:  Negative for abdominal pain, constipation, diarrhea, nausea and vomiting.  Genitourinary:  Negative for bladder incontinence, difficulty urinating, dysuria, frequency, hematuria  and nocturia.   Musculoskeletal:  Positive for back pain (5/10 severity) and flank pain (right side, 5/10 severity). Negative for arthralgias, myalgias and neck pain.  Skin:  Negative for itching and rash.  Neurological:  Positive for headaches. Negative for dizziness and numbness.       +tingling hands  Hematological:  Does not bruise/bleed easily.  Psychiatric/Behavioral:  Positive for depression. Negative for sleep disturbance and suicidal ideas. The patient is nervous/anxious.   All other systems reviewed and are negative.    VITALS:    Blood pressure 120/78, pulse 74, temperature 98 F (36.7 C), temperature source Oral, resp. rate 18, weight 233 lb 12.8 oz (106.1 kg), SpO2 98%.  Wt Readings from Last 3 Encounters:  01/20/24 233 lb 12.8 oz (106.1 kg)  10/28/23 235 lb 8 oz (106.8 kg)  05/19/23 235 lb 6.4 oz (106.8 kg)    Body mass index is 33.55 kg/m.  Performance status (ECOG): 1 - Symptomatic but completely ambulatory  PHYSICAL EXAM:   Physical Exam Vitals and nursing note reviewed. Exam conducted with a chaperone present.  Constitutional:      Appearance: Normal appearance.  Cardiovascular:     Rate and Rhythm: Normal rate and regular rhythm.     Pulses: Normal pulses.     Heart sounds: Normal heart sounds.  Pulmonary:     Effort: Pulmonary effort is normal.     Breath sounds: Normal breath sounds.  Abdominal:     Palpations: Abdomen is soft. There is no hepatomegaly, splenomegaly or mass.     Tenderness: There is no abdominal tenderness.  Musculoskeletal:     Right lower leg: No edema.     Left lower leg: No edema.  Lymphadenopathy:     Cervical: No cervical adenopathy.     Right cervical: No superficial, deep or posterior cervical adenopathy.    Left cervical: No superficial, deep or posterior cervical adenopathy.     Upper Body:     Right upper body: No supraclavicular or axillary adenopathy.     Left upper body: No supraclavicular or axillary adenopathy.  Neurological:     General: No focal deficit present.     Mental Status: Justin Berger is alert and oriented to person, place, and time.  Psychiatric:        Mood and Affect: Mood normal.        Behavior: Behavior normal.     LABS:      Latest Ref Rng & Units 01/20/2024    2:09 PM 10/28/2023    2:22 PM 07/29/2023   12:52 PM  CBC  WBC 4.0 - 10.5 K/uL 6.5  6.2  6.1   Hemoglobin 13.0 - 17.0 g/dL 86.5  78.4  69.6   Hematocrit 39.0 - 52.0 % 40.7  40.5  38.2   Platelets 150 - 400 K/uL 487  467  501       Latest Ref Rng & Units 01/20/2024    2:09  PM 10/28/2023    2:22 PM 03/04/2023    8:25 AM  CMP  Glucose 70 - 99 mg/dL 295  284  132   BUN 8 - 23 mg/dL 16  16  18    Creatinine 0.61 - 1.24 mg/dL 4.40  1.02  7.25   Sodium 135 - 145 mmol/L 138  138  137   Potassium 3.5 - 5.1 mmol/L 3.9  4.0  4.0   Chloride 98 - 111 mmol/L 102  104  105   CO2 22 - 32 mmol/L  24  24  22    Calcium 8.9 - 10.3 mg/dL 9.6  9.5  9.3   Total Protein 6.5 - 8.1 g/dL 6.9  6.9  6.6   Total Bilirubin 0.0 - 1.2 mg/dL 0.8  0.8  0.9   Alkaline Phos 38 - 126 U/L 55  46  43   AST 15 - 41 U/L 15  15  17    ALT 0 - 44 U/L 19  18  17       No results found for: "CEA1", "CEA" / No results found for: "CEA1", "CEA" No results found for: "PSA1" No results found for: "ZOX096" No results found for: "CAN125"  Lab Results  Component Value Date   TOTALPROTELP 5.8 (L) 04/27/2021   TOTALPROTELP 5.7 (L) 04/27/2021   ALBUMINELP 3.6 04/27/2021   A1GS 0.2 04/27/2021   A2GS 0.7 04/27/2021   BETS 0.9 04/27/2021   GAMS 0.4 04/27/2021   MSPIKE Not Observed 04/27/2021   SPEI Comment 04/27/2021   Lab Results  Component Value Date   TIBC 407 10/28/2023   TIBC 372 07/29/2023   TIBC 351 06/16/2023   FERRITIN 236 10/28/2023   FERRITIN 196 07/29/2023   FERRITIN 243 06/16/2023   IRONPCTSAT 25 10/28/2023   IRONPCTSAT 29 07/29/2023   IRONPCTSAT 28 06/16/2023   Lab Results  Component Value Date   LDH 245 (H) 01/20/2024   LDH 212 (H) 07/29/2023   LDH 221 (H) 06/16/2023     STUDIES:   No results found.

## 2024-01-21 DIAGNOSIS — M79676 Pain in unspecified toe(s): Secondary | ICD-10-CM | POA: Diagnosis not present

## 2024-01-21 DIAGNOSIS — B351 Tinea unguium: Secondary | ICD-10-CM | POA: Diagnosis not present

## 2024-01-29 ENCOUNTER — Ambulatory Visit (HOSPITAL_COMMUNITY)
Admission: RE | Admit: 2024-01-29 | Discharge: 2024-01-29 | Disposition: A | Discharge status: Home / Self Care | Payer: Medicare Other | Source: Ambulatory Visit | Attending: Hematology | Admitting: Hematology

## 2024-01-29 DIAGNOSIS — I6782 Cerebral ischemia: Secondary | ICD-10-CM | POA: Diagnosis not present

## 2024-01-29 DIAGNOSIS — R42 Dizziness and giddiness: Secondary | ICD-10-CM | POA: Diagnosis not present

## 2024-01-29 DIAGNOSIS — R9089 Other abnormal findings on diagnostic imaging of central nervous system: Secondary | ICD-10-CM | POA: Diagnosis not present

## 2024-01-29 DIAGNOSIS — R2689 Other abnormalities of gait and mobility: Secondary | ICD-10-CM | POA: Insufficient documentation

## 2024-01-29 MED ORDER — GADOBUTROL 1 MMOL/ML IV SOLN
10.0000 mL | Freq: Once | INTRAVENOUS | Status: AC | PRN
  Administered 2024-01-29: 10 mL via INTRAVENOUS

## 2024-02-03 DIAGNOSIS — E7849 Other hyperlipidemia: Secondary | ICD-10-CM | POA: Diagnosis not present

## 2024-02-03 DIAGNOSIS — Z1321 Encounter for screening for nutritional disorder: Secondary | ICD-10-CM | POA: Diagnosis not present

## 2024-02-03 DIAGNOSIS — E119 Type 2 diabetes mellitus without complications: Secondary | ICD-10-CM | POA: Diagnosis not present

## 2024-02-03 DIAGNOSIS — D649 Anemia, unspecified: Secondary | ICD-10-CM | POA: Diagnosis not present

## 2024-02-03 DIAGNOSIS — I10 Essential (primary) hypertension: Secondary | ICD-10-CM | POA: Diagnosis not present

## 2024-02-03 DIAGNOSIS — N189 Chronic kidney disease, unspecified: Secondary | ICD-10-CM | POA: Diagnosis not present

## 2024-02-03 DIAGNOSIS — D559 Anemia due to enzyme disorder, unspecified: Secondary | ICD-10-CM | POA: Diagnosis not present

## 2024-02-05 ENCOUNTER — Inpatient Hospital Stay: Payer: Medicare Other | Attending: Physician Assistant | Admitting: Hematology

## 2024-02-05 DIAGNOSIS — D473 Essential (hemorrhagic) thrombocythemia: Secondary | ICD-10-CM

## 2024-02-05 MED ORDER — AMOXICILLIN-POT CLAVULANATE 875-125 MG PO TABS: 1.0000 | ORAL_TABLET | Freq: Two times a day (BID) | ORAL | 0 refills | Status: AC

## 2024-02-05 MED ORDER — METHYLPREDNISOLONE 4 MG PO TBPK: ORAL_TABLET | ORAL | 0 refills | Status: DC

## 2024-02-05 NOTE — Progress Notes (Signed)
 Virtual Visit via Telephone Note  I connected with Justin Berger on 02/05/24 at  3:45 PM EST by telephone and verified that I am speaking with the correct person using two identifiers.  Location: Patient: At home Provider: At office   I discussed the limitations, risks, security and privacy concerns of performing an evaluation and management service by telephone and the availability of in person appointments. I also discussed with the patient that there may be a patient responsible charge related to this service. The patient expressed understanding and agreed to proceed.   History of Present Illness: Justin Berger is a 73 y.o. sex being called for brain MRI results from 01/29/24 after last being seen by me on 01/20/24, in which he described feeling off balance. He is diagnosed with CALR positive essential thrombocytosis, treated with hydroxyurea  2 tablets daily and baby ASA. Brain MRI showed: No acute intracranial process and small dural-based or calvarial contrast-enhancing lesion along the right parietal convexity measuring 9 mm.   Observations/Objective: I talked to the patient and his wife over the phone.  After MRI dye injection, he felt numbness of his hands.  He also felt weakness after going home.  He continues to have balance problems.  Assessment and Plan:  1.  Feeling off balance: - I discussed findings on the MRI of the brain.  No acute process.  Small dural based contrast-enhancing lesion along the right parietal convexity measuring 9 mm.  Differential includes meningioma.  Follow-up MRI in 3 to 6 months is recommended. - He continues to feel off balance.  He does have some occasional numbness in the hands more than feet. - Will make a referral to Dr.Walsh (neurology).   Follow Up Instructions:  He will keep his previous appointment for follow-up. I discussed the assessment and treatment plan with the patient. The patient was provided an opportunity to ask questions and all  were answered. The patient agreed with the plan and demonstrated an understanding of the instructions.   The patient was advised to call back or seek an in-person evaluation if the symptoms worsen or if the condition fails to improve as anticipated.  I provided 20 minutes of non-face-to-face time during this encounter.    Justin Berger,acting as a neurosurgeon for Alean Stands, MD.,have documented all relevant documentation on the behalf of Alean Stands, MD,as directed by  Alean Stands, MD while in the presence of Alean Stands, MD.  I, Alean Stands MD, have reviewed the above documentation for accuracy and completeness, and I agree with the above.    [employer]

## 2024-02-16 DIAGNOSIS — F331 Major depressive disorder, recurrent, moderate: Secondary | ICD-10-CM | POA: Diagnosis not present

## 2024-02-16 DIAGNOSIS — Z6834 Body mass index (BMI) 34.0-34.9, adult: Secondary | ICD-10-CM | POA: Diagnosis not present

## 2024-02-16 DIAGNOSIS — H353290 Exudative age-related macular degeneration, unspecified eye, stage unspecified: Secondary | ICD-10-CM | POA: Diagnosis not present

## 2024-02-16 DIAGNOSIS — I10 Essential (primary) hypertension: Secondary | ICD-10-CM | POA: Diagnosis not present

## 2024-02-16 DIAGNOSIS — E782 Mixed hyperlipidemia: Secondary | ICD-10-CM | POA: Diagnosis not present

## 2024-02-26 ENCOUNTER — Other Ambulatory Visit: Payer: Self-pay | Admitting: Physician Assistant

## 2024-02-26 DIAGNOSIS — D473 Essential (hemorrhagic) thrombocythemia: Secondary | ICD-10-CM

## 2024-03-02 DIAGNOSIS — H40013 Open angle with borderline findings, low risk, bilateral: Secondary | ICD-10-CM | POA: Diagnosis not present

## 2024-03-17 DIAGNOSIS — H34832 Tributary (branch) retinal vein occlusion, left eye, with macular edema: Secondary | ICD-10-CM | POA: Diagnosis not present

## 2024-03-17 DIAGNOSIS — E119 Type 2 diabetes mellitus without complications: Secondary | ICD-10-CM | POA: Diagnosis not present

## 2024-03-17 DIAGNOSIS — H40013 Open angle with borderline findings, low risk, bilateral: Secondary | ICD-10-CM | POA: Diagnosis not present

## 2024-03-29 DIAGNOSIS — I10 Essential (primary) hypertension: Secondary | ICD-10-CM | POA: Diagnosis not present

## 2024-03-29 DIAGNOSIS — E782 Mixed hyperlipidemia: Secondary | ICD-10-CM | POA: Diagnosis not present

## 2024-03-29 DIAGNOSIS — E1165 Type 2 diabetes mellitus with hyperglycemia: Secondary | ICD-10-CM | POA: Diagnosis not present

## 2024-03-31 DIAGNOSIS — M79676 Pain in unspecified toe(s): Secondary | ICD-10-CM | POA: Diagnosis not present

## 2024-03-31 DIAGNOSIS — B351 Tinea unguium: Secondary | ICD-10-CM | POA: Diagnosis not present

## 2024-04-06 ENCOUNTER — Inpatient Hospital Stay: Payer: Medicare Other | Attending: Physician Assistant

## 2024-04-06 DIAGNOSIS — Z85828 Personal history of other malignant neoplasm of skin: Secondary | ICD-10-CM | POA: Insufficient documentation

## 2024-04-06 DIAGNOSIS — Z79899 Other long term (current) drug therapy: Secondary | ICD-10-CM | POA: Diagnosis not present

## 2024-04-06 DIAGNOSIS — D469 Myelodysplastic syndrome, unspecified: Secondary | ICD-10-CM | POA: Insufficient documentation

## 2024-04-06 DIAGNOSIS — Z85820 Personal history of malignant melanoma of skin: Secondary | ICD-10-CM | POA: Insufficient documentation

## 2024-04-06 DIAGNOSIS — Z87891 Personal history of nicotine dependence: Secondary | ICD-10-CM | POA: Insufficient documentation

## 2024-04-06 DIAGNOSIS — D500 Iron deficiency anemia secondary to blood loss (chronic): Secondary | ICD-10-CM

## 2024-04-06 DIAGNOSIS — E538 Deficiency of other specified B group vitamins: Secondary | ICD-10-CM | POA: Insufficient documentation

## 2024-04-06 DIAGNOSIS — D473 Essential (hemorrhagic) thrombocythemia: Secondary | ICD-10-CM | POA: Insufficient documentation

## 2024-04-06 LAB — LACTATE DEHYDROGENASE: LDH: 237 U/L — ABNORMAL HIGH (ref 98–192)

## 2024-04-06 LAB — COMPREHENSIVE METABOLIC PANEL WITH GFR
ALT: 16 U/L (ref 0–44)
AST: 16 U/L (ref 15–41)
Albumin: 4.3 g/dL (ref 3.5–5.0)
Alkaline Phosphatase: 55 U/L (ref 38–126)
Anion gap: 11 (ref 5–15)
BUN: 20 mg/dL (ref 8–23)
CO2: 22 mmol/L (ref 22–32)
Calcium: 9.6 mg/dL (ref 8.9–10.3)
Chloride: 103 mmol/L (ref 98–111)
Creatinine, Ser: 1.21 mg/dL (ref 0.61–1.24)
GFR, Estimated: >60 mL/min (ref >60)
Glucose, Bld: 166 mg/dL — ABNORMAL HIGH (ref 70–99)
Potassium: 3.7 mmol/L (ref 3.5–5.1)
Sodium: 136 mmol/L (ref 135–145)
Total Bilirubin: 1.4 mg/dL — ABNORMAL HIGH (ref 0.0–1.2)
Total Protein: 6.8 g/dL (ref 6.5–8.1)

## 2024-04-06 LAB — IRON AND TIBC
Iron: 99 ug/dL (ref 45–182)
Saturation Ratios: 26 % (ref 17.9–39.5)
TIBC: 378 ug/dL (ref 250–450)
UIBC: 279 ug/dL

## 2024-04-06 LAB — FERRITIN: Ferritin: 190 ng/mL (ref 24–336)

## 2024-04-08 ENCOUNTER — Other Ambulatory Visit: Payer: Self-pay | Admitting: *Deleted

## 2024-04-08 DIAGNOSIS — E1140 Type 2 diabetes mellitus with diabetic neuropathy, unspecified: Secondary | ICD-10-CM | POA: Diagnosis not present

## 2024-04-08 DIAGNOSIS — G629 Polyneuropathy, unspecified: Secondary | ICD-10-CM | POA: Diagnosis not present

## 2024-04-08 DIAGNOSIS — D473 Essential (hemorrhagic) thrombocythemia: Secondary | ICD-10-CM

## 2024-04-09 ENCOUNTER — Inpatient Hospital Stay

## 2024-04-09 DIAGNOSIS — Z85820 Personal history of malignant melanoma of skin: Secondary | ICD-10-CM | POA: Diagnosis not present

## 2024-04-09 DIAGNOSIS — Z79899 Other long term (current) drug therapy: Secondary | ICD-10-CM | POA: Diagnosis not present

## 2024-04-09 DIAGNOSIS — D473 Essential (hemorrhagic) thrombocythemia: Secondary | ICD-10-CM

## 2024-04-09 DIAGNOSIS — Z87891 Personal history of nicotine dependence: Secondary | ICD-10-CM | POA: Diagnosis not present

## 2024-04-09 DIAGNOSIS — D469 Myelodysplastic syndrome, unspecified: Secondary | ICD-10-CM | POA: Diagnosis not present

## 2024-04-09 DIAGNOSIS — E538 Deficiency of other specified B group vitamins: Secondary | ICD-10-CM | POA: Diagnosis not present

## 2024-04-09 LAB — CBC WITH DIFFERENTIAL/PLATELET
Abs Immature Granulocytes: 0.2 10*3/uL — ABNORMAL HIGH (ref 0.00–0.07)
Basophils Absolute: 0.2 10*3/uL — ABNORMAL HIGH (ref 0.0–0.1)
Basophils Relative: 3 %
Eosinophils Absolute: 0 10*3/uL (ref 0.0–0.5)
Eosinophils Relative: 0 %
HCT: 39.2 % (ref 39.0–52.0)
Hemoglobin: 11.8 g/dL — ABNORMAL LOW (ref 13.0–17.0)
Lymphocytes Relative: 20 %
Lymphs Abs: 1.2 10*3/uL (ref 0.7–4.0)
MCH: 31.1 pg (ref 26.0–34.0)
MCHC: 30.1 g/dL (ref 30.0–36.0)
MCV: 103.4 fL — ABNORMAL HIGH (ref 80.0–100.0)
Metamyelocytes Relative: 3 %
Monocytes Absolute: 0.9 10*3/uL (ref 0.1–1.0)
Monocytes Relative: 14 %
Neutro Abs: 3.7 10*3/uL (ref 1.7–7.7)
Neutrophils Relative %: 60 %
Platelets: 446 10*3/uL — ABNORMAL HIGH (ref 150–400)
RBC: 3.79 MIL/uL — ABNORMAL LOW (ref 4.22–5.81)
RDW: 19.2 % — ABNORMAL HIGH (ref 11.5–15.5)
WBC: 6.2 10*3/uL (ref 4.0–10.5)
nRBC: 1.6 % — ABNORMAL HIGH (ref 0.0–0.2)
nRBC: 6 /100{WBCs} — ABNORMAL HIGH

## 2024-04-11 ENCOUNTER — Other Ambulatory Visit: Payer: Self-pay | Admitting: Hematology

## 2024-04-11 DIAGNOSIS — D473 Essential (hemorrhagic) thrombocythemia: Secondary | ICD-10-CM

## 2024-04-13 ENCOUNTER — Inpatient Hospital Stay (HOSPITAL_BASED_OUTPATIENT_CLINIC_OR_DEPARTMENT_OTHER): Payer: Medicare Other | Admitting: Hematology

## 2024-04-13 VITALS — BP 138/81 | HR 83 | Temp 97.1°F | Resp 19 | Ht 70.0 in | Wt 233.8 lb

## 2024-04-13 DIAGNOSIS — D473 Essential (hemorrhagic) thrombocythemia: Secondary | ICD-10-CM

## 2024-04-13 DIAGNOSIS — Z85820 Personal history of malignant melanoma of skin: Secondary | ICD-10-CM | POA: Diagnosis not present

## 2024-04-13 DIAGNOSIS — D469 Myelodysplastic syndrome, unspecified: Secondary | ICD-10-CM | POA: Diagnosis not present

## 2024-04-13 DIAGNOSIS — D500 Iron deficiency anemia secondary to blood loss (chronic): Secondary | ICD-10-CM | POA: Diagnosis not present

## 2024-04-13 DIAGNOSIS — E538 Deficiency of other specified B group vitamins: Secondary | ICD-10-CM | POA: Diagnosis not present

## 2024-04-13 DIAGNOSIS — Z87891 Personal history of nicotine dependence: Secondary | ICD-10-CM | POA: Diagnosis not present

## 2024-04-13 DIAGNOSIS — Z79899 Other long term (current) drug therapy: Secondary | ICD-10-CM | POA: Diagnosis not present

## 2024-04-13 NOTE — Patient Instructions (Signed)
 [facility] at [facility] Discharge Instructions   You were seen and examined today by Dr. Cheree Cords.  He reviewed the results of your lab work which are normal/stable.   Continue Hydrea as prescribed.   We will see you back in 3 months. We will repeat lab work prior to this visit.    Return as scheduled.    Thank you for choosing [facility] at [facility] to provide your oncology and hematology care.  To afford each patient quality time with our provider, please arrive at least 15 minutes before your scheduled appointment time.   If you have a lab appointment with the Cancer Center please come in thru the Main Entrance and check in at the main information desk.  You need to re-schedule your appointment should you arrive 10 or more minutes late.  We strive to give you quality time with our providers, and arriving late affects you and other patients whose appointments are after yours.  Also, if you no show three or more times for appointments you may be dismissed from the clinic at the providers discretion.     Again, thank you for choosing [facility].  Our hope is that these requests will decrease the amount of time that you wait before being seen by our physicians.       _____________________________________________________________  Should you have questions after your visit to [facility], please contact our office at [phone] and follow the prompts.  Our office hours are 8:00 a.m. and 4:30 p.m. Monday - Friday.  Please note that voicemails left after 4:00 p.m. may not be returned until the following business day.  We are closed weekends and major holidays.  You do have access to a nurse 24-7, just call the main number to the clinic [phone] and do not press any options, hold on the line and a nurse will answer the phone.    For prescription refill requests, have your pharmacy contact our office and allow  72 hours.    Due to Covid, you will need to wear a mask upon entering the hospital. If you do not have a mask, a mask will be given to you at the Main Entrance upon arrival. For doctor visits, patients may have 1 support person age 69 or older with them. For treatment visits, patients can not have anyone with them due to social distancing guidelines and our immunocompromised population.

## 2024-04-13 NOTE — Progress Notes (Signed)
 [facility] [address] [place] [address]    Clinic Day:  04/13/2024  Referring physician: Paulett Boros, MD  Patient Care Team: Paulett Boros, MD as PCP - General (Hematology) Riley Cheadle Windsor Hatcher, MD as Consulting Physician (Gastroenterology) Paulett Boros, MD as Consulting Physician (Hematology)   ASSESSMENT & PLAN:   Assessment: 1.  Essential thrombocytosis, CALR+ - High risk disease with history of thrombosis (left leg DVT) and age > 52 - Bone marrow biopsy 12/10/2017 by Dr. Cheree Cords at [facility] in [place], [place] Flow cytometty for leukemia/lymphoma was non-diagnostic Pathology review showed 60% cellularity with significant megakaryocytic hyperplasia with clusters of megakaryocytes; reticulin stain shows slight (Grade 1) reticulin fibrosis, some centered around areas of megakaryocyte clusters Cytogenic analysis showed normal male karyotype 46,XY[20] Noted to be at risk for progression to post essential thrombocythemia related myelofibrosis, but peripheral blood did not show other features of myelofibrosis, such as leukoerythroblastic blood or atypical megakaryocyte morphology - Intermittent bluish discoloration of bilateral palms, nonpainful.  He reports intermittent blurry vision.  Reports worsening daytime "sweating spells" and drenching night sweats. - Hydroxyurea started in December 2018 - current dose 1000 mg TTSun and 1500 mg MWFSat. - CT abdomen and pelvis on 09/29/2017 showed splenomegaly with spleen 14.5 cm.  Abdominal ultrasound (11/02/2021): Stable splenomegaly with spleen 14.3 cm.  MRI abdomen (11/20/2021): No splenomegaly noted.     2.  Myelodysplastic syndrome: - BMBX on 03/18/2023: Coexisting MDS with ring sideroblasts more than 15%, no increase in blasts.  MDS FISH panel and chromosome analysis were normal.  Serum EPO level 112.8.  NGS panel showed IDH 2, SF3 B1 and CALR mutations.    3.  Vitamin B12 and  folate deficiencies - Folic acid deficiency noted on 02/12/2022 with folate 5.3, normal homocystine - Borderline vitamin B12 noted on 02/12/2022 with B12 287 and normal MMA - Patient taking vitamin B12 500 mcg daily and folic acid 400 mcg daily  - Most recent labs (12/02/2022) with improved folate 17.4, vitamin B12 836, MMA normal.   4. 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  CALR positive essential thrombocytosis: - He is taking hydroxyurea 2 tablets daily and aspirin 81 mg daily. - No aqua genic pruritus or vasomotor symptoms.  No prior history of thrombosis. - MRI of the brain on 01/29/2024: No intracranial process.  Small dural based/calvarial contrast-enhancing lesion along the right parietal convexity measures 9 mm which could represent a meningioma.  Follow-up brain MRI in 3 to 6 months was recommended. - As he was having balance problems worse in the evenings, I have recommended neurology evaluation.  He was seen by Dr. Sueanne Emerald in [place].  It was thought that his peripheral neuropathy was causing balance problems. - Reviewed labs from 04/09/2024: Hematocrit is 39.2.  Platelet count is 446, improved from 487 at last visit in January.  White count is normal. - Recommended continuing hydroxyurea 2 tablets daily.  RTC 12 weeks for follow-up with repeat labs.  If he develops any significant anemia, we may consider luspatercept .   2.  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 - Hemoglobin today is 11.8.  Ferritin is 190 and percent saturation 26.    3.  Vitamin B12 and folate deficiencies: -  Continue B12 and folic acid supplements.  Previous levels were within normal limits.     Orders Placed This Encounter  Procedures   CBC with Differential    Standing Status:   Future    Expected Date:   07/12/2024    Expiration Date:   04/13/2025   Lactate dehydrogenase    Standing Status:   Future    Expected Date:   07/12/2024    Expiration Date:   04/13/2025   Iron and TIBC ([facility] ONLY)    Standing Status:   Future    Expected Date:   07/12/2024    Expiration Date:   04/13/2025   Ferritin    Standing Status:   Future    Expected Date:   07/12/2024    Expiration Date:   04/13/2025      Mikeal Hawthorne R Teague,acting as a scribe for Doreatha Massed, MD.,have documented all relevant documentation on the behalf of Doreatha Massed, MD,as directed by  Doreatha Massed, MD while in the presence of Doreatha Massed, MD.  I, Doreatha Massed MD, have reviewed the above documentation for accuracy and completeness, and I agree with the above.      Doreatha Massed, MD   4/15/20253:49 PM  CHIEF COMPLAINT:   Diagnosis: CALR+ essential thrombocytosis and iron deficiency anemia    Cancer Staging  No matching staging information was found for the patient.    Prior Therapy: Hydrea (1,000 mg Tu/Th/Su and 1,500 mg on Mo/We/Fr/Sat)   Current Therapy: Hydroxyurea 1000 mg daily.   HISTORY OF PRESENT ILLNESS:   Oncology History  Essential thrombocytosis (HCC)  08/31/2011 Cancer Diagnosis   History of colon cancer, status post resection in 2012, status post adjuvant chemotherapy with 6 months of 5-FU and leucovorin under the direction of Dr.Karb at [facility]   09/29/2017 Imaging   CT scan of the abdomen done for nonspecific abdominal pain shows incidental splenomegaly  Patient referred to [place] for further workup   10/30/2017 Genetic Testing   CALR mutation positive Jak 2 [id number] Negative   12/10/2017 Bone Marrow Biopsy   60% cellularity with  significant megakaryocytic hyperplasia Grade 1 reticulin fibrosis Occasional hypo-lobulated megakaryocytes Flow cytometry with no significant abnormality Chromosome analysis 46, XY   12/10/2017 Initial Diagnosis   Essential thrombocytosis, CALR positive, high risk disease given his history of thrombosis and age more than 60  Hydroxyurea started on 12/12/2017      INTERVAL HISTORY:   Justin Berger is a 73 y.o. male presenting to clinic today for follow up of CALR+ essential thrombocytosis and iron deficiency anemia. He was last seen by me on 02/05/24.  Today, he states that he is doing well overall. His appetite level is at 100%. His energy level is at 0%. He is accompanied by his wife.   He reports generalized weakness. Hristopher notes numbness in the feet and has been seen by a specialist, who told him his feeling off balance was likely due to neuropathy.  He has been taking hydroxyurea as prescribed  He denies any aquagenic pruritus. Mykel notes occasional cyanosis in the fingers, particularly when taking showers.   PAST MEDICAL HISTORY:   Past Medical History: Past Medical History:  Diagnosis Date   AAA (abdominal aortic aneurysm) without rupture (HCC) 01/2021   Colon cancer (HCC) 2003   Diabetes (HCC)    Essential thrombocytosis (  HCC)    Hypertension    Skin cancer, basal cell    back    Surgical History: Past Surgical History:  Procedure Laterality Date   BIOPSY  12/18/2021   Procedure: BIOPSY;  Surgeon: Dolores Frame, MD;  [facility];  Service: Gastroenterology;;   CATARACT EXTRACTION, BILATERAL     COLON SURGERY  06/23/2002   right colectomy   COLONOSCOPY  08/25/2020   Dr. Arlyn Dunning, normal colon other than venous lake in Left colon   COLONOSCOPY WITH PROPOFOL N/A 10/23/2021   Procedure: COLONOSCOPY WITH PROPOFOL;  Surgeon: Dolores Frame, MD;  [facility];  Service: Gastroenterology;  Laterality: N/A;  12:30   ESOPHAGEAL  BANDING  10/23/2021   Procedure: ESOPHAGEAL BANDING;  Surgeon: Dolores Frame, MD;  [facility];  Service: Gastroenterology;;   ESOPHAGEAL BANDING  12/18/2021   Procedure: ESOPHAGEAL BANDING;  Surgeon: Marguerita Merles, Reuel Boom, MD;  [facility];  Service: Gastroenterology;;   ESOPHAGEAL BANDING  02/08/2022   Procedure: ESOPHAGEAL BANDING;  Surgeon: Marguerita Merles, Reuel Boom, MD;  [facility];  Service: Gastroenterology;;   ESOPHAGOGASTRODUODENOSCOPY  05/25/2021   [facility]: moderate inflammation characterized by adherent blood, erythema, and friability in gastric body, antrum and prepyloric region, as well as 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   ESOPHAGOGASTRODUODENOSCOPY (EGD) WITH PROPOFOL N/A 10/23/2021   Procedure: ESOPHAGOGASTRODUODENOSCOPY (EGD) WITH PROPOFOL;  Surgeon: Dolores Frame, MD;  [facility];  Service: Gastroenterology;  Laterality: N/A;   ESOPHAGOGASTRODUODENOSCOPY (EGD) WITH PROPOFOL N/A 12/18/2021   Procedure: ESOPHAGOGASTRODUODENOSCOPY (EGD) WITH PROPOFOL;  Surgeon: Dolores Frame, MD;  [facility];  Service: Gastroenterology;  Laterality: N/A;  8:05   ESOPHAGOGASTRODUODENOSCOPY (EGD) WITH PROPOFOL N/A 02/08/2022   Procedure: ESOPHAGOGASTRODUODENOSCOPY (EGD) WITH PROPOFOL;  Surgeon: Dolores Frame, MD;  [facility];  Service: Gastroenterology;  Laterality: N/A;  1055   ESOPHAGOGASTRODUODENOSCOPY (EGD) WITH PROPOFOL N/A 07/09/2022   Procedure: ESOPHAGOGASTRODUODENOSCOPY (EGD) WITH PROPOFOL;  Surgeon: Dolores Frame, MD;  [facility];  Service: Gastroenterology;  Laterality: N/A;  1130 ASA 2   ESOPHAGOGASTRODUODENOSCOPY (EGD) WITH PROPOFOL N/A 10/17/2022   Procedure: ESOPHAGOGASTRODUODENOSCOPY (EGD) WITH PROPOFOL;  Surgeon: Dolores Frame, MD;  [facility];  Service: Gastroenterology;   Laterality: N/A;  915 ASA 2   HEMOSTASIS CLIP PLACEMENT  02/08/2022   Procedure: HEMOSTASIS CLIP PLACEMENT;  Surgeon: Dolores Frame, MD;  [facility];  Service: Gastroenterology;;   IR BONE MARROW BIOPSY & ASPIRATION  03/18/2023   IR TRANSCATHETER BX  12/03/2021   IR US GUIDE VASC ACCESS RIGHT  12/03/2021   POLYPECTOMY  10/23/2021   Procedure: POLYPECTOMY INTESTINAL;  Surgeon: Dolores Frame, MD;  [facility];  Service: Gastroenterology;;   POLYPECTOMY  02/08/2022   Procedure: POLYPECTOMY;  Surgeon: Dolores Frame, MD;  [facility];  Service: Gastroenterology;;   TONSILLECTOMY      Social History: Social History   Socioeconomic History   Marital status: Married    Spouse name: Not on file   Number of children: Not on file   Years of education: Not on file   Highest education level: Not on file  Occupational History   Not on file  Tobacco Use   Smoking status: Former    Current packs/day: 0.00    Average packs/day: 0.5 packs/day for 3.0 years (1.5 ttl pk-yrs)  Types: Cigarettes    Start date: 04/09/1975    Quit date: 04/08/1978    Years since quitting: 46.0   Smokeless tobacco: Never  Vaping Use   Vaping status: Never Used  Substance and Sexual Activity   Alcohol use: Never   Drug use: Never   Sexual activity: Not on file  Other Topics Concern   Not on file  Social History Narrative   Not on file   Social Drivers of Health   Financial Resource Strain: Low Risk  (11/29/2020)   Overall Financial Resource Strain (CARDIA)    Difficulty of Paying Living Expenses: Not hard at all  Food Insecurity: No Food Insecurity (11/29/2020)   Hunger Vital Sign    Worried About Running Out of Food in the Last Year: Never true    Ran Out of Food in the Last Year: Never true  Transportation Needs: No Transportation Needs (11/29/2020)   PRAPARE - Administrator, Civil Service (Medical): No    Lack of  Transportation (Non-Medical): No  Physical Activity: Inactive (11/29/2020)   Exercise Vital Sign    Days of Exercise per Week: 0 days    Minutes of Exercise per Session: 0 min  Stress: No Stress Concern Present (11/29/2020)   [employer] of Occupational Health - Occupational Stress Questionnaire    Feeling of Stress : Not at all  Social Connections: Moderately Integrated (11/29/2020)   Social Connection and Isolation Panel [NHANES]    Frequency of Communication with Friends and Family: More than three times a week    Frequency of Social Gatherings with Friends and Family: Three times a week    Attends Religious Services: 1 to 4 times per year    Active Member of Clubs or Organizations: No    Attends Banker Meetings: Never    Marital Status: Married  Catering manager Violence: Not At Risk (11/29/2020)   Humiliation, Afraid, Rape, and Kick questionnaire    Fear of Current or Ex-Partner: No    Emotionally Abused: No    Physically Abused: No    Sexually Abused: No    Family History: Family History  Problem Relation Age of Onset   Dementia Mother    Cancer Paternal Uncle        lung    Current Medications:  Current Outpatient Medications:    ACCU-CHEK GUIDE test strip, SMARTSIG:Strip(s) Via Meter Daily, Disp: , Rfl:    Accu-Chek Softclix Lancets lancets, USE TO TEST BLOOD SUGAR DAILY, Disp: , Rfl:    acetaminophen (TYLENOL) 500 MG tablet, Take 1,000 mg by mouth every 6 (six) hours as needed for moderate pain or headache., Disp: , Rfl:    allopurinol (ZYLOPRIM) 300 MG tablet, Take 300 mg by mouth in the morning., Disp: , Rfl:    amoxicillin-clavulanate (AUGMENTIN) 875-125 MG tablet, Take 1 tablet by mouth 2 (two) times daily., Disp: 14 tablet, Rfl: 0   Ascorbic Acid (VITAMIN C PO), Take 2,000 mg by mouth daily as needed (during winter season)., Disp: , Rfl:    aspirin 81 MG EC tablet, Take 81 mg by mouth in the morning., Disp: , Rfl:    atorvastatin (LIPITOR) 10 MG  tablet, Take 10 mg by mouth every Sunday., Disp: , Rfl:    Blood Glucose Monitoring Suppl (ACCU-CHEK GUIDE ME) w/Device KIT, USE TO TEST FASTING SUGAR ONCE DAILY, Disp: , Rfl:    Cholecalciferol (VITAMIN D3 PO), Take 2 capsules by mouth in the morning., Disp: , Rfl:  clonazePAM (KLONOPIN) 0.5 MG tablet, Take 0.5 mg by mouth daily as needed for anxiety., Disp: , Rfl:    DULoxetine (CYMBALTA) 60 MG capsule, Take 60 mg by mouth in the morning., Disp: , Rfl:    empagliflozin (JARDIANCE) 25 MG TABS tablet, Take by mouth., Disp: , Rfl:    fluticasone (FLONASE) 50 MCG/ACT nasal spray, Place 1 spray into both nostrils daily as needed for allergies or rhinitis., Disp: , Rfl:    gabapentin (NEURONTIN) 300 MG capsule, Take 900 mg by mouth at bedtime., Disp: , Rfl:    glipiZIDE (GLUCOTROL XL) 10 MG 24 hr tablet, Take 10 mg by mouth 2 (two) times daily., Disp: , Rfl:    hydroxyurea (HYDREA) 500 MG capsule, Take 2 capsules (1,000 mg total) by mouth daily., Disp: 60 capsule, Rfl: 3   loratadine (CLARITIN) 10 MG tablet, Take 10 mg by mouth daily as needed for allergies., Disp: , Rfl:    metoprolol succinate (TOPROL-XL) 50 MG 24 hr tablet, Take 50 mg by mouth every evening., Disp: , Rfl:    Multiple Vitamin (MULTIVITAMIN WITH MINERALS) TABS tablet, Take 1 tablet by mouth in the morning., Disp: , Rfl:    omeprazole (PRILOSEC) 40 MG capsule, Take 1 capsule (40 mg total) by mouth daily., Disp: 90 capsule, Rfl: 3   sucralfate (CARAFATE) 1 g tablet, Take 1 tablet (1 g total) by mouth 2 (two) times daily., Disp: 28 tablet, Rfl: 0   tadalafil (CIALIS) 20 MG tablet, Take 20 mg by mouth daily as needed for erectile dysfunction., Disp: , Rfl:    zinc gluconate 50 MG tablet, Take 50 mg by mouth daily as needed (during the winter season)., Disp: , Rfl:    Allergies: Allergies  Allergen Reactions   Ivp Dye  [Iodinated Contrast Media]     Heart stopped    Gadavist [Gadobutrol] Other (See Comments)    Numbness in the  hands, weakness    REVIEW OF SYSTEMS:   Review of Systems  Constitutional:  Negative for chills, fatigue and fever.  HENT:   Positive for trouble swallowing. Negative for lump/mass, mouth sores, nosebleeds and sore throat.   Eyes:  Negative for eye problems.  Respiratory:  Positive for shortness of breath. Negative for cough.   Cardiovascular:  Negative for chest pain, leg swelling and palpitations.  Gastrointestinal:  Positive for constipation. Negative for abdominal pain, diarrhea, nausea and vomiting.  Genitourinary:  Negative for bladder incontinence, difficulty urinating, dysuria, frequency, hematuria and nocturia.   Musculoskeletal:  Negative for arthralgias, back pain, flank pain, myalgias and neck pain.  Skin:  Negative for itching and rash.  Neurological:  Positive for headaches and numbness (in feet). Negative for dizziness.  Hematological:  Does not bruise/bleed easily.  Psychiatric/Behavioral:  Positive for depression. Negative for sleep disturbance and suicidal ideas. The patient is not nervous/anxious.   All other systems reviewed and are negative.    VITALS:   Blood pressure 138/81, pulse 83, temperature (!) 97.1 F (36.2 C), temperature source Tympanic, resp. rate 19, height 5\' 10"  (1.778 m), weight 233 lb 12.8 oz (106.1 kg), SpO2 97%.  Wt Readings from Last 3 Encounters:  04/13/24 233 lb 12.8 oz (106.1 kg)  01/20/24 233 lb 12.8 oz (106.1 kg)  10/28/23 235 lb 8 oz (106.8 kg)    Body mass index is 33.55 kg/m.  Performance status (ECOG): 1 - Symptomatic but completely ambulatory  PHYSICAL EXAM:   Physical Exam Vitals and nursing note reviewed. Exam conducted with a  chaperone present.  Constitutional:      Appearance: Normal appearance.  Cardiovascular:     Rate and Rhythm: Normal rate and regular rhythm.     Pulses: Normal pulses.     Heart sounds: Normal heart sounds.  Pulmonary:     Effort: Pulmonary effort is normal.     Breath sounds: Normal breath  sounds.  Abdominal:     Palpations: Abdomen is soft. There is no hepatomegaly, splenomegaly or mass.     Tenderness: There is abdominal tenderness (mild, on deep inspiration) in the left upper quadrant.  Musculoskeletal:     Right lower leg: No edema.     Left lower leg: No edema.  Lymphadenopathy:     Cervical: No cervical adenopathy.     Right cervical: No superficial, deep or posterior cervical adenopathy.    Left cervical: No superficial, deep or posterior cervical adenopathy.     Upper Body:     Right upper body: No supraclavicular or axillary adenopathy.     Left upper body: No supraclavicular or axillary adenopathy.  Neurological:     General: No focal deficit present.     Mental Status: He is alert and oriented to person, place, and time.  Psychiatric:        Mood and Affect: Mood normal.        Behavior: Behavior normal.     LABS:      Latest Ref Rng & Units 04/09/2024    8:43 AM 01/20/2024    2:09 PM 10/28/2023    2:22 PM  CBC  WBC 4.0 - 10.5 K/uL 6.2  6.5  6.2   Hemoglobin 13.0 - 17.0 g/dL 78.2  95.6  21.3   Hematocrit 39.0 - 52.0 % 39.2  40.7  40.5   Platelets 150 - 400 K/uL 446  487  467       Latest Ref Rng & Units 04/06/2024    2:28 PM 01/20/2024    2:09 PM 10/28/2023    2:22 PM  CMP  Glucose 70 - 99 mg/dL 086  578  469   BUN 8 - 23 mg/dL 20  16  16    Creatinine 0.61 - 1.24 mg/dL 6.29  5.28  4.13   Sodium 135 - 145 mmol/L 136  138  138   Potassium 3.5 - 5.1 mmol/L 3.7  3.9  4.0   Chloride 98 - 111 mmol/L 103  102  104   CO2 22 - 32 mmol/L 22  24  24    Calcium 8.9 - 10.3 mg/dL 9.6  9.6  9.5   Total Protein 6.5 - 8.1 g/dL 6.8  6.9  6.9   Total Bilirubin 0.0 - 1.2 mg/dL 1.4  0.8  0.8   Alkaline Phos 38 - 126 U/L 55  55  46   AST 15 - 41 U/L 16  15  15    ALT 0 - 44 U/L 16  19  18       No results found for: "CEA1", "CEA" / No results found for: "CEA1", "CEA" No results found for: "PSA1" No results found for: "KGM010" No results found for: "CAN125"   Lab Results  Component Value Date   TOTALPROTELP 5.8 (L) 04/27/2021   TOTALPROTELP 5.7 (L) 04/27/2021   ALBUMINELP 3.6 04/27/2021   A1GS 0.2 04/27/2021   A2GS 0.7 04/27/2021   BETS 0.9 04/27/2021   GAMS 0.4 04/27/2021   MSPIKE Not Observed 04/27/2021   SPEI Comment 04/27/2021   Lab Results  Component  Value Date   TIBC 378 04/06/2024   TIBC 407 10/28/2023   TIBC 372 07/29/2023   FERRITIN 190 04/06/2024   FERRITIN 236 10/28/2023   FERRITIN 196 07/29/2023   IRONPCTSAT 26 04/06/2024   IRONPCTSAT 25 10/28/2023   IRONPCTSAT 29 07/29/2023   Lab Results  Component Value Date   LDH 237 (H) 04/06/2024   LDH 245 (H) 01/20/2024   LDH 212 (H) 07/29/2023     STUDIES:   No results found.

## 2024-04-28 DIAGNOSIS — E782 Mixed hyperlipidemia: Secondary | ICD-10-CM | POA: Diagnosis not present

## 2024-04-28 DIAGNOSIS — I10 Essential (primary) hypertension: Secondary | ICD-10-CM | POA: Diagnosis not present

## 2024-04-28 DIAGNOSIS — E1165 Type 2 diabetes mellitus with hyperglycemia: Secondary | ICD-10-CM | POA: Diagnosis not present

## 2024-05-28 DIAGNOSIS — E782 Mixed hyperlipidemia: Secondary | ICD-10-CM | POA: Diagnosis not present

## 2024-05-28 DIAGNOSIS — E1165 Type 2 diabetes mellitus with hyperglycemia: Secondary | ICD-10-CM | POA: Diagnosis not present

## 2024-05-28 DIAGNOSIS — I10 Essential (primary) hypertension: Secondary | ICD-10-CM | POA: Diagnosis not present

## 2024-06-09 ENCOUNTER — Other Ambulatory Visit: Payer: Self-pay | Admitting: Hematology

## 2024-06-09 DIAGNOSIS — E1142 Type 2 diabetes mellitus with diabetic polyneuropathy: Secondary | ICD-10-CM | POA: Diagnosis not present

## 2024-06-09 DIAGNOSIS — E7849 Other hyperlipidemia: Secondary | ICD-10-CM | POA: Diagnosis not present

## 2024-06-09 DIAGNOSIS — D473 Essential (hemorrhagic) thrombocythemia: Secondary | ICD-10-CM

## 2024-06-09 DIAGNOSIS — B351 Tinea unguium: Secondary | ICD-10-CM | POA: Diagnosis not present

## 2024-06-09 DIAGNOSIS — M79676 Pain in unspecified toe(s): Secondary | ICD-10-CM | POA: Diagnosis not present

## 2024-06-09 DIAGNOSIS — E1165 Type 2 diabetes mellitus with hyperglycemia: Secondary | ICD-10-CM | POA: Diagnosis not present

## 2024-06-09 DIAGNOSIS — N189 Chronic kidney disease, unspecified: Secondary | ICD-10-CM | POA: Diagnosis not present

## 2024-06-09 DIAGNOSIS — D559 Anemia due to enzyme disorder, unspecified: Secondary | ICD-10-CM | POA: Diagnosis not present

## 2024-06-09 DIAGNOSIS — Z1329 Encounter for screening for other suspected endocrine disorder: Secondary | ICD-10-CM | POA: Diagnosis not present

## 2024-06-09 DIAGNOSIS — I10 Essential (primary) hypertension: Secondary | ICD-10-CM | POA: Diagnosis not present

## 2024-06-16 DIAGNOSIS — I10 Essential (primary) hypertension: Secondary | ICD-10-CM | POA: Diagnosis not present

## 2024-06-16 DIAGNOSIS — E782 Mixed hyperlipidemia: Secondary | ICD-10-CM | POA: Diagnosis not present

## 2024-06-16 DIAGNOSIS — H353290 Exudative age-related macular degeneration, unspecified eye, stage unspecified: Secondary | ICD-10-CM | POA: Diagnosis not present

## 2024-06-16 DIAGNOSIS — E1165 Type 2 diabetes mellitus with hyperglycemia: Secondary | ICD-10-CM | POA: Diagnosis not present

## 2024-06-16 DIAGNOSIS — Z6834 Body mass index (BMI) 34.0-34.9, adult: Secondary | ICD-10-CM | POA: Diagnosis not present

## 2024-06-28 DIAGNOSIS — E1165 Type 2 diabetes mellitus with hyperglycemia: Secondary | ICD-10-CM | POA: Diagnosis not present

## 2024-06-28 DIAGNOSIS — E782 Mixed hyperlipidemia: Secondary | ICD-10-CM | POA: Diagnosis not present

## 2024-06-28 DIAGNOSIS — I10 Essential (primary) hypertension: Secondary | ICD-10-CM | POA: Diagnosis not present

## 2024-07-07 DIAGNOSIS — H34832 Tributary (branch) retinal vein occlusion, left eye, with macular edema: Secondary | ICD-10-CM | POA: Diagnosis not present

## 2024-07-07 DIAGNOSIS — R29898 Other symptoms and signs involving the musculoskeletal system: Secondary | ICD-10-CM | POA: Diagnosis not present

## 2024-07-07 DIAGNOSIS — G6289 Other specified polyneuropathies: Secondary | ICD-10-CM | POA: Diagnosis not present

## 2024-07-07 DIAGNOSIS — H40013 Open angle with borderline findings, low risk, bilateral: Secondary | ICD-10-CM | POA: Diagnosis not present

## 2024-07-07 DIAGNOSIS — E119 Type 2 diabetes mellitus without complications: Secondary | ICD-10-CM | POA: Diagnosis not present

## 2024-07-07 DIAGNOSIS — R531 Weakness: Secondary | ICD-10-CM | POA: Diagnosis not present

## 2024-07-13 ENCOUNTER — Inpatient Hospital Stay: Attending: Physician Assistant

## 2024-07-13 DIAGNOSIS — D473 Essential (hemorrhagic) thrombocythemia: Secondary | ICD-10-CM

## 2024-07-13 DIAGNOSIS — D509 Iron deficiency anemia, unspecified: Secondary | ICD-10-CM | POA: Diagnosis not present

## 2024-07-13 DIAGNOSIS — D500 Iron deficiency anemia secondary to blood loss (chronic): Secondary | ICD-10-CM

## 2024-07-13 DIAGNOSIS — Z79899 Other long term (current) drug therapy: Secondary | ICD-10-CM | POA: Insufficient documentation

## 2024-07-13 LAB — CBC WITH DIFFERENTIAL/PLATELET
Abs Immature Granulocytes: 0.1 K/uL — ABNORMAL HIGH (ref 0.00–0.07)
Basophils Absolute: 0.1 K/uL (ref 0.0–0.1)
Basophils Relative: 2 %
Eosinophils Absolute: 0.1 K/uL (ref 0.0–0.5)
Eosinophils Relative: 1 %
HCT: 40.2 % (ref 39.0–52.0)
Hemoglobin: 12.5 g/dL — ABNORMAL LOW (ref 13.0–17.0)
Lymphocytes Relative: 21 %
Lymphs Abs: 1.4 K/uL (ref 0.7–4.0)
MCH: 32 pg (ref 26.0–34.0)
MCHC: 31.1 g/dL (ref 30.0–36.0)
MCV: 102.8 fL — ABNORMAL HIGH (ref 80.0–100.0)
Metamyelocytes Relative: 1 %
Monocytes Absolute: 0.8 K/uL (ref 0.1–1.0)
Monocytes Relative: 12 %
Myelocytes: 1 %
Neutro Abs: 4 K/uL (ref 1.7–7.7)
Neutrophils Relative %: 62 %
Platelets: 455 K/uL — ABNORMAL HIGH (ref 150–400)
RBC: 3.91 MIL/uL — ABNORMAL LOW (ref 4.22–5.81)
RDW: 18.5 % — ABNORMAL HIGH (ref 11.5–15.5)
WBC: 6.5 K/uL (ref 4.0–10.5)
nRBC: 1.2 % — ABNORMAL HIGH (ref 0.0–0.2)

## 2024-07-13 LAB — IRON AND TIBC
Iron: 104 ug/dL (ref 45–182)
Saturation Ratios: 27 % (ref 17.9–39.5)
TIBC: 388 ug/dL (ref 250–450)
UIBC: 284 ug/dL

## 2024-07-13 LAB — LACTATE DEHYDROGENASE: LDH: 220 U/L — ABNORMAL HIGH (ref 98–192)

## 2024-07-13 LAB — FERRITIN: Ferritin: 160 ng/mL (ref 24–336)

## 2024-07-14 DIAGNOSIS — G6289 Other specified polyneuropathies: Secondary | ICD-10-CM | POA: Diagnosis not present

## 2024-07-14 DIAGNOSIS — R531 Weakness: Secondary | ICD-10-CM | POA: Diagnosis not present

## 2024-07-14 DIAGNOSIS — R29898 Other symptoms and signs involving the musculoskeletal system: Secondary | ICD-10-CM | POA: Diagnosis not present

## 2024-07-16 DIAGNOSIS — R531 Weakness: Secondary | ICD-10-CM | POA: Diagnosis not present

## 2024-07-16 DIAGNOSIS — G6289 Other specified polyneuropathies: Secondary | ICD-10-CM | POA: Diagnosis not present

## 2024-07-16 DIAGNOSIS — R29898 Other symptoms and signs involving the musculoskeletal system: Secondary | ICD-10-CM | POA: Diagnosis not present

## 2024-07-20 ENCOUNTER — Other Ambulatory Visit: Payer: Self-pay | Admitting: *Deleted

## 2024-07-20 ENCOUNTER — Inpatient Hospital Stay (HOSPITAL_BASED_OUTPATIENT_CLINIC_OR_DEPARTMENT_OTHER): Admitting: Hematology

## 2024-07-20 VITALS — BP 128/78 | HR 73 | Temp 97.6°F | Resp 20 | Wt 226.2 lb

## 2024-07-20 DIAGNOSIS — R531 Weakness: Secondary | ICD-10-CM | POA: Diagnosis not present

## 2024-07-20 DIAGNOSIS — Z79899 Other long term (current) drug therapy: Secondary | ICD-10-CM | POA: Diagnosis not present

## 2024-07-20 DIAGNOSIS — G6289 Other specified polyneuropathies: Secondary | ICD-10-CM | POA: Diagnosis not present

## 2024-07-20 DIAGNOSIS — D500 Iron deficiency anemia secondary to blood loss (chronic): Secondary | ICD-10-CM | POA: Diagnosis not present

## 2024-07-20 DIAGNOSIS — D473 Essential (hemorrhagic) thrombocythemia: Secondary | ICD-10-CM | POA: Diagnosis not present

## 2024-07-20 DIAGNOSIS — R29898 Other symptoms and signs involving the musculoskeletal system: Secondary | ICD-10-CM | POA: Diagnosis not present

## 2024-07-20 DIAGNOSIS — D329 Benign neoplasm of meninges, unspecified: Secondary | ICD-10-CM

## 2024-07-20 DIAGNOSIS — D509 Iron deficiency anemia, unspecified: Secondary | ICD-10-CM | POA: Diagnosis not present

## 2024-07-20 NOTE — Progress Notes (Signed)
 [facility] [address] [place] [address]    Clinic Day:  07/20/2024  Referring physician: Rogers Hai, MD  Patient Care Team: Rogers Hai, MD as PCP - General (Hematology) Shaaron Lamar HERO, MD as Consulting Physician (Gastroenterology) Rogers Hai, MD as Consulting Physician (Hematology)   ASSESSMENT & PLAN:   Assessment: 1.  Essential thrombocytosis, CALR+ - High risk disease with history of thrombosis (left leg DVT) and age > 72 - Bone marrow biopsy 12/10/2017 by Dr. Rogers at [facility] in [place], [place] Flow cytometty for leukemia/lymphoma was non-diagnostic Pathology review showed 60% cellularity with significant megakaryocytic hyperplasia with clusters of megakaryocytes; reticulin stain shows slight (Grade 1) reticulin fibrosis, some centered around areas of megakaryocyte clusters Cytogenic analysis showed normal male karyotype 46,XY[20] Noted to be at risk for progression to post essential thrombocythemia related myelofibrosis, but peripheral blood did not show other features of myelofibrosis, such as leukoerythroblastic blood or atypical megakaryocyte morphology - Intermittent bluish discoloration of bilateral palms, nonpainful.  He reports intermittent blurry vision.  Reports worsening daytime sweating spells and drenching night sweats. - Hydroxyurea  started in December 2018 - current dose 1000 mg TTSun and 1500 mg MWFSat. - CT abdomen and pelvis on 09/29/2017 showed splenomegaly with spleen 14.5 cm.  Abdominal ultrasound (11/02/2021): Stable splenomegaly with spleen 14.3 cm.  MRI abdomen (11/20/2021): No splenomegaly noted.     2.  Myelodysplastic syndrome: - BMBX on 03/18/2023: Coexisting MDS with ring sideroblasts more than 15%, no increase in blasts.  MDS FISH panel and chromosome analysis were normal.  Serum EPO level 112.8.  NGS panel showed IDH 2, SF3 B1 and CALR mutations.    3.  Vitamin B12 and  folate deficiencies - Folic acid  deficiency noted on 02/12/2022 with folate 5.3, normal homocystine - Borderline vitamin B12 noted on 02/12/2022 with B12 287 and normal MMA - Patient taking vitamin B12 500 mcg daily and folic acid  400 mcg daily  - Most recent labs (12/02/2022) with improved folate 17.4, vitamin B12 836, MMA normal.   4. 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 Personal history of melanoma - Isolated lesion of right leg melanoma s/p surgical excision many years ago    Plan: 1.  CALR positive essential thrombocytosis: - MRI of the brain on 01/29/2024 with no intracranial process.  There is a small meningioma. - He has balance problems and was evaluated by neurology.  It was thought to be from peripheral neuropathy. - He is tolerating hydroxyurea  2 tablets daily and aspirin 81 mg daily. - Reviewed labs from 07/13/2024: White count is 6.5.  Platelet count is 455. - I have discussed that CALR positive ET has better prognosis in terms of thrombosis compared to JAK2 positive ET.  I am not being aggressive at aiming for a platelet count below 400 K, as it can suppress his hemoglobin. - No aquagenic pruritus or vasomotor symptoms.  Continue Hydrea  2 tablets daily and aspirin 81 mg daily. - Reevaluate in 12 weeks with repeat labs. - I will recommend repeating MRI of the brain to follow-up on the meningioma.   2.  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 - He does not require any treatment for MDS.  If his hemoglobin drops  below 9, will consider luspatercept .   3.  Vitamin B12 and folate deficiencies: - Continue B12 and folic acid  supplements.  4.  Normocytic/macrocytic anemia: - He complains of feeling tired.  Ferritin  level is 160 and trending downwards.  Saturation is 27.  He has mild CKD. - I have recommended another dose of Monoferric .  Last Monoferric  was last year which he tolerated well and felt energized after the infusion.     Orders Placed This Encounter  Procedures   CBC with Differential    Standing Status:   Future    Expected Date:   10/18/2024    Expiration Date:   01/16/2025   Comprehensive metabolic panel    Standing Status:   Future    Expected Date:   10/18/2024    Expiration Date:   01/16/2025   Lactate dehydrogenase    Standing Status:   Future    Expected Date:   10/18/2024    Expiration Date:   01/16/2025   Iron and TIBC ([facility] ONLY)    Standing Status:   Future    Expected Date:   10/18/2024    Expiration Date:   01/16/2025   Ferritin    Standing Status:   Future    Expected Date:   10/18/2024    Expiration Date:   01/16/2025      LILLETTE Hummingbird R Teague,acting as a scribe for Alean Stands, MD.,have documented all relevant documentation on the behalf of Alean Stands, MD,as directed by  Alean Stands, MD while in the presence of Alean Stands, MD.  I, Alean Stands MD, have reviewed the above documentation for accuracy and completeness, and I agree with the above.       Alean Stands, MD   7/22/20251:43 PM  CHIEF COMPLAINT:   Diagnosis: CALR+ essential thrombocytosis and iron deficiency anemia    Cancer Staging  No matching staging information was found for the patient.    Prior Therapy: Hydrea  (1,000 mg Tu/Th/Su and 1,500 mg on Mo/We/Fr/Sat)   Current Therapy: Hydroxyurea  1000 mg daily.   HISTORY OF PRESENT ILLNESS:   Oncology History  Essential thrombocytosis (HCC)  08/31/2011 Cancer Diagnosis   History of colon cancer, status post resection in 2012, status post adjuvant chemotherapy with 6 months of 5-FU and leucovorin under the direction of Dr.Karb at [facility]   09/29/2017 Imaging   CT scan  of the abdomen done for nonspecific abdominal pain shows incidental splenomegaly  Patient referred to us  for further workup   10/30/2017 Genetic Testing   CALR mutation positive Jak 2 [id number] Negative   12/10/2017 Bone Marrow Biopsy   60% cellularity with significant megakaryocytic hyperplasia Grade 1 reticulin fibrosis Occasional hypo-lobulated megakaryocytes Flow cytometry with no significant abnormality Chromosome analysis 46, XY   12/10/2017 Initial Diagnosis   Essential thrombocytosis, CALR positive, high risk disease given his history of thrombosis and age more than 60  Hydroxyurea  started on 12/12/2017      INTERVAL HISTORY:   Justin Berger is a 73 y.o. male presenting to clinic today for follow up of CALR+ essential thrombocytosis and iron deficiency anemia. He was last seen by me on 04/13/2024.  Today, he states that he is doing well overall. His appetite level is at 75%. His energy level is at 0%. He is accompanied by his wife. Adit notes low energy and balance issues. He does not believe balance issues are due to neuropathy. Sayvon is taking gabapentin at night and says he is worried about the medication causing  dementia after seeing a study that found a correlation between the medication and the disease.   His fatigue becomes severe after 2 pm everyday and he is unable to do his daily activities after that point. He notes an improvement in energy after his last iron infusion in 2024. He denies any side effects from prior iron infusion.   He denies any aquagenic pruritus or erythromelalgias. He is taking aspirin, folic acid , and vitamin B 12 as prescribed.   Nasean recently started Ozempic for hyperglycemia and notes he get nauseated a few hours after his injections. He has received 1 injection so far and has his next injection today. He has previously treated hyperglycemia with Jardiance, which was ineffective.  PAST MEDICAL HISTORY:   Past Medical History: Past Medical History:   Diagnosis Date   AAA (abdominal aortic aneurysm) without rupture (HCC) 01/2021   Colon cancer (HCC) 2003   Diabetes (HCC)    Essential thrombocytosis (HCC)    Hypertension    Skin cancer, basal cell    back    Surgical History: Past Surgical History:  Procedure Laterality Date   BIOPSY  12/18/2021   Procedure: BIOPSY;  Surgeon: Eartha Angelia Sieving, MD;  [facility];  Service: Gastroenterology;;   CATARACT EXTRACTION, BILATERAL     COLON SURGERY  06/23/2002   right colectomy   COLONOSCOPY  08/25/2020   Dr. Duwaine Jumper, normal colon other than venous lake in Left colon   COLONOSCOPY WITH PROPOFOL  N/A 10/23/2021   Procedure: COLONOSCOPY WITH PROPOFOL ;  Surgeon: Eartha Angelia Sieving, MD;  [facility];  Service: Gastroenterology;  Laterality: N/A;  12:30   ESOPHAGEAL BANDING  10/23/2021   Procedure: ESOPHAGEAL BANDING;  Surgeon: Eartha Angelia Sieving, MD;  [facility];  Service: Gastroenterology;;   ESOPHAGEAL BANDING  12/18/2021   Procedure: ESOPHAGEAL BANDING;  Surgeon: Eartha Angelia, Sieving, MD;  [facility];  Service: Gastroenterology;;   ESOPHAGEAL BANDING  02/08/2022   Procedure: ESOPHAGEAL BANDING;  Surgeon: Eartha Angelia, Sieving, MD;  [facility];  Service: Gastroenterology;;   ESOPHAGOGASTRODUODENOSCOPY  05/25/2021   [facility]: moderate inflammation characterized by adherent blood, erythema, and friability in gastric body, antrum and prepyloric region, as well as 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   ESOPHAGOGASTRODUODENOSCOPY (EGD) WITH PROPOFOL  N/A 10/23/2021   Procedure: ESOPHAGOGASTRODUODENOSCOPY (EGD) WITH PROPOFOL ;  Surgeon: Eartha Angelia Sieving, MD;  [facility];  Service: Gastroenterology;  Laterality: N/A;   ESOPHAGOGASTRODUODENOSCOPY (EGD) WITH PROPOFOL  N/A 12/18/2021   Procedure: ESOPHAGOGASTRODUODENOSCOPY (EGD) WITH PROPOFOL ;   Surgeon: Eartha Angelia Sieving, MD;  [facility];  Service: Gastroenterology;  Laterality: N/A;  8:05   ESOPHAGOGASTRODUODENOSCOPY (EGD) WITH PROPOFOL  N/A 02/08/2022   Procedure: ESOPHAGOGASTRODUODENOSCOPY (EGD) WITH PROPOFOL ;  Surgeon: Eartha Angelia Sieving, MD;  [facility];  Service: Gastroenterology;  Laterality: N/A;  1055   ESOPHAGOGASTRODUODENOSCOPY (EGD) WITH PROPOFOL  N/A 07/09/2022   Procedure: ESOPHAGOGASTRODUODENOSCOPY (EGD) WITH PROPOFOL ;  Surgeon: Eartha Angelia Sieving, MD;  [facility];  Service: Gastroenterology;  Laterality: N/A;  1130 ASA 2   ESOPHAGOGASTRODUODENOSCOPY (EGD) WITH PROPOFOL  N/A 10/17/2022   Procedure: ESOPHAGOGASTRODUODENOSCOPY (EGD) WITH PROPOFOL ;  Surgeon: Eartha Angelia Sieving, MD;  [facility];  Service: Gastroenterology;  Laterality: N/A;  915 ASA 2   HEMOSTASIS CLIP PLACEMENT  02/08/2022   Procedure: HEMOSTASIS CLIP PLACEMENT;  Surgeon: Eartha Angelia Sieving, MD;  [facility];  Service: Gastroenterology;;  IR BONE MARROW BIOPSY & ASPIRATION  03/18/2023   IR TRANSCATHETER BX  12/03/2021   IR US  GUIDE VASC ACCESS RIGHT  12/03/2021   POLYPECTOMY  10/23/2021   Procedure: POLYPECTOMY INTESTINAL;  Surgeon: Eartha Angelia Sieving, MD;  [facility];  Service: Gastroenterology;;   POLYPECTOMY  02/08/2022   Procedure: POLYPECTOMY;  Surgeon: Eartha Angelia, Sieving, MD;  [facility];  Service: Gastroenterology;;   TONSILLECTOMY      Social History: Social History   Socioeconomic History   Marital status: Married    Spouse name: Not on file   Number of children: Not on file   Years of education: Not on file   Highest education level: Not on file  Occupational History   Not on file  Tobacco Use   Smoking status: Former    Current packs/day: 0.00    Average packs/day: 0.5 packs/day for 3.0 years (1.5 ttl pk-yrs)    Types: Cigarettes    Start date:  04/09/1975    Quit date: 04/08/1978    Years since quitting: 46.3   Smokeless tobacco: Never  Vaping Use   Vaping status: Never Used  Substance and Sexual Activity   Alcohol use: Never   Drug use: Never   Sexual activity: Not on file  Other Topics Concern   Not on file  Social History Narrative   Not on file   Social Drivers of Health   Financial Resource Strain: Low Risk  (11/29/2020)   Overall Financial Resource Strain (CARDIA)    Difficulty of Paying Living Expenses: Not hard at all  Food Insecurity: No Food Insecurity (11/29/2020)   Hunger Vital Sign    Worried About Running Out of Food in the Last Year: Never true    Ran Out of Food in the Last Year: Never true  Transportation Needs: No Transportation Needs (11/29/2020)   PRAPARE - Administrator, Civil Service (Medical): No    Lack of Transportation (Non-Medical): No  Physical Activity: Inactive (11/29/2020)   Exercise Vital Sign    Days of Exercise per Week: 0 days    Minutes of Exercise per Session: 0 min  Stress: No Stress Concern Present (11/29/2020)   [employer] of Occupational Health - Occupational Stress Questionnaire    Feeling of Stress : Not at all  Social Connections: Moderately Integrated (11/29/2020)   Social Connection and Isolation Panel    Frequency of Communication with Friends and Family: More than three times a week    Frequency of Social Gatherings with Friends and Family: Three times a week    Attends Religious Services: 1 to 4 times per year    Active Member of Clubs or Organizations: No    Attends Banker Meetings: Never    Marital Status: Married  Catering manager Violence: Not At Risk (11/29/2020)   Humiliation, Afraid, Rape, and Kick questionnaire    Fear of Current or Ex-Partner: No    Emotionally Abused: No    Physically Abused: No    Sexually Abused: No    Family History: Family History  Problem Relation Age of Onset   Dementia Mother    Cancer Paternal  Uncle        lung    Current Medications:  Current Outpatient Medications:    ACCU-CHEK GUIDE test strip, SMARTSIG:Strip(s) Via Meter Daily, Disp: , Rfl:    Accu-Chek Softclix Lancets lancets, USE TO TEST BLOOD SUGAR DAILY, Disp: , Rfl:    acetaminophen (TYLENOL) 500  MG tablet, Take 1,000 mg by mouth every 6 (six) hours as needed for moderate pain or headache., Disp: , Rfl:    allopurinol  (ZYLOPRIM ) 300 MG tablet, Take 300 mg by mouth in the morning., Disp: , Rfl:    amoxicillin -clavulanate (AUGMENTIN ) 875-125 MG tablet, Take 1 tablet by mouth 2 (two) times daily., Disp: 14 tablet, Rfl: 0   Ascorbic Acid (VITAMIN C PO), Take 2,000 mg by mouth daily as needed (during winter season)., Disp: , Rfl:    aspirin 81 MG EC tablet, Take 81 mg by mouth in the morning., Disp: , Rfl:    atorvastatin  (LIPITOR) 10 MG tablet, Take 10 mg by mouth every Sunday., Disp: , Rfl:    Blood Glucose Monitoring Suppl (ACCU-CHEK GUIDE ME) w/Device KIT, USE TO TEST FASTING SUGAR ONCE DAILY, Disp: , Rfl:    Cholecalciferol (VITAMIN D3 PO), Take 2 capsules by mouth in the morning., Disp: , Rfl:    clonazePAM  (KLONOPIN ) 0.5 MG tablet, Take 0.5 mg by mouth daily as needed for anxiety., Disp: , Rfl:    DULoxetine (CYMBALTA) 60 MG capsule, Take 60 mg by mouth in the morning., Disp: , Rfl:    empagliflozin (JARDIANCE) 25 MG TABS tablet, Take by mouth., Disp: , Rfl:    fluticasone (FLONASE) 50 MCG/ACT nasal spray, Place 1 spray into both nostrils daily as needed for allergies or rhinitis., Disp: , Rfl:    gabapentin (NEURONTIN) 300 MG capsule, Take 900 mg by mouth at bedtime., Disp: , Rfl:    glipiZIDE  (GLUCOTROL  XL) 10 MG 24 hr tablet, Take 10 mg by mouth 2 (two) times daily., Disp: , Rfl:    hydroxyurea  (HYDREA ) 500 MG capsule, TAKE 2 CAPSULES (1,000 MG TOTAL) BY MOUTH DAILY, Disp: 180 capsule, Rfl: 1   loratadine (CLARITIN) 10 MG tablet, Take 10 mg by mouth daily as needed for allergies., Disp: , Rfl:    metoprolol   succinate (TOPROL -XL) 50 MG 24 hr tablet, Take 50 mg by mouth every evening., Disp: , Rfl:    Multiple Vitamin (MULTIVITAMIN WITH MINERALS) TABS tablet, Take 1 tablet by mouth in the morning., Disp: , Rfl:    omeprazole  (PRILOSEC) 40 MG capsule, Take 1 capsule (40 mg total) by mouth daily., Disp: 90 capsule, Rfl: 3   sucralfate  (CARAFATE ) 1 g tablet, Take 1 tablet (1 g total) by mouth 2 (two) times daily., Disp: 28 tablet, Rfl: 0   tadalafil (CIALIS) 20 MG tablet, Take 20 mg by mouth daily as needed for erectile dysfunction., Disp: , Rfl:    zinc gluconate 50 MG tablet, Take 50 mg by mouth daily as needed (during the winter season)., Disp: , Rfl:    Allergies: Allergies  Allergen Reactions   Ivp Dye  [Iodinated Contrast Media]     Heart stopped    Gadavist  [Gadobutrol ] Other (See Comments)    Numbness in the hands, weakness    REVIEW OF SYSTEMS:   Review of Systems  Constitutional:  Negative for chills, fatigue and fever.  HENT:   Negative for lump/mass, mouth sores, nosebleeds, sore throat and trouble swallowing.   Eyes:  Negative for eye problems.  Respiratory:  Positive for shortness of breath (occasional). Negative for cough.   Cardiovascular:  Negative for chest pain, leg swelling and palpitations.  Gastrointestinal:  Positive for constipation and nausea. Negative for abdominal pain, diarrhea and vomiting.  Genitourinary:  Negative for bladder incontinence, difficulty urinating, dysuria, frequency, hematuria and nocturia.   Musculoskeletal:  Negative for arthralgias, back pain, flank  pain, myalgias and neck pain.  Skin:  Negative for itching and rash.  Neurological:  Positive for headaches. Negative for dizziness and numbness.       +tingling in hands and feet  Hematological:  Does not bruise/bleed easily.  Psychiatric/Behavioral:  Positive for depression. Negative for sleep disturbance and suicidal ideas. The patient is not nervous/anxious.   All other systems reviewed and are  negative.    VITALS:   Blood pressure 128/78, pulse 73, temperature 97.6 F (36.4 C), temperature source Tympanic, resp. rate 20, weight 226 lb 3.1 oz (102.6 kg), SpO2 98%.  Wt Readings from Last 3 Encounters:  07/20/24 226 lb 3.1 oz (102.6 kg)  04/13/24 233 lb 12.8 oz (106.1 kg)  01/20/24 233 lb 12.8 oz (106.1 kg)    Body mass index is 32.46 kg/m.  Performance status (ECOG): 1 - Symptomatic but completely ambulatory  PHYSICAL EXAM:   Physical Exam Vitals and nursing note reviewed. Exam conducted with a chaperone present.  Constitutional:      Appearance: Normal appearance.  Cardiovascular:     Rate and Rhythm: Normal rate and regular rhythm.     Pulses: Normal pulses.     Heart sounds: Normal heart sounds.  Pulmonary:     Effort: Pulmonary effort is normal.     Breath sounds: Normal breath sounds.  Abdominal:     Palpations: Abdomen is soft. There is no hepatomegaly, splenomegaly or mass.     Tenderness: There is no abdominal tenderness.  Musculoskeletal:     Right lower leg: No edema.     Left lower leg: No edema.  Lymphadenopathy:     Cervical: No cervical adenopathy.     Right cervical: No superficial, deep or posterior cervical adenopathy.    Left cervical: No superficial, deep or posterior cervical adenopathy.     Upper Body:     Right upper body: No supraclavicular or axillary adenopathy.     Left upper body: No supraclavicular or axillary adenopathy.  Neurological:     General: No focal deficit present.     Mental Status: He is alert and oriented to person, place, and time.  Psychiatric:        Mood and Affect: Mood normal.        Behavior: Behavior normal.     LABS:      Latest Ref Rng & Units 07/13/2024    9:02 AM 04/09/2024    8:43 AM 01/20/2024    2:09 PM  CBC  WBC 4.0 - 10.5 K/uL 6.5  6.2  6.5   Hemoglobin 13.0 - 17.0 g/dL 87.4  88.1  87.1   Hematocrit 39.0 - 52.0 % 40.2  39.2  40.7   Platelets 150 - 400 K/uL 455  446  487       Latest Ref  Rng & Units 04/06/2024    2:28 PM 01/20/2024    2:09 PM 10/28/2023    2:22 PM  CMP  Glucose 70 - 99 mg/dL 833  797  846   BUN 8 - 23 mg/dL 20  16  16    Creatinine 0.61 - 1.24 mg/dL 8.78  8.72  8.81   Sodium 135 - 145 mmol/L 136  138  138   Potassium 3.5 - 5.1 mmol/L 3.7  3.9  4.0   Chloride 98 - 111 mmol/L 103  102  104   CO2 22 - 32 mmol/L 22  24  24    Calcium 8.9 - 10.3 mg/dL 9.6  9.6  9.5   Total Protein 6.5 - 8.1 g/dL 6.8  6.9  6.9   Total Bilirubin 0.0 - 1.2 mg/dL 1.4  0.8  0.8   Alkaline Phos 38 - 126 U/L 55  55  46   AST 15 - 41 U/L 16  15  15    ALT 0 - 44 U/L 16  19  18       No results found for: CEA1, CEA / No results found for: CEA1, CEA No results found for: PSA1 No results found for: CAN199 No results found for: RJW874  Lab Results  Component Value Date   TOTALPROTELP 5.8 (L) 04/27/2021   TOTALPROTELP 5.7 (L) 04/27/2021   ALBUMINELP 3.6 04/27/2021   A1GS 0.2 04/27/2021   A2GS 0.7 04/27/2021   BETS 0.9 04/27/2021   GAMS 0.4 04/27/2021   MSPIKE Not Observed 04/27/2021   SPEI Comment 04/27/2021   Lab Results  Component Value Date   TIBC 388 07/13/2024   TIBC 378 04/06/2024   TIBC 407 10/28/2023   FERRITIN 160 07/13/2024   FERRITIN 190 04/06/2024   FERRITIN 236 10/28/2023   IRONPCTSAT 27 07/13/2024   IRONPCTSAT 26 04/06/2024   IRONPCTSAT 25 10/28/2023   Lab Results  Component Value Date   LDH 220 (H) 07/13/2024   LDH 237 (H) 04/06/2024   LDH 245 (H) 01/20/2024     STUDIES:   No results found.

## 2024-07-20 NOTE — Patient Instructions (Addendum)
 [facility] at [facility] Discharge Instructions   You were seen and examined today by Dr. Cheree Cords.  He reviewed the results of your lab work which are normal/stable.   Continue Hydrea as prescribed.   We will see you back in 3 months. We will repeat lab work prior to this visit.    Return as scheduled.    Thank you for choosing [facility] at [facility] to provide your oncology and hematology care.  To afford each patient quality time with our provider, please arrive at least 15 minutes before your scheduled appointment time.   If you have a lab appointment with the Cancer Center please come in thru the Main Entrance and check in at the main information desk.  You need to re-schedule your appointment should you arrive 10 or more minutes late.  We strive to give you quality time with our providers, and arriving late affects you and other patients whose appointments are after yours.  Also, if you no show three or more times for appointments you may be dismissed from the clinic at the providers discretion.     Again, thank you for choosing [facility].  Our hope is that these requests will decrease the amount of time that you wait before being seen by our physicians.       _____________________________________________________________  Should you have questions after your visit to [facility], please contact our office at [phone] and follow the prompts.  Our office hours are 8:00 a.m. and 4:30 p.m. Monday - Friday.  Please note that voicemails left after 4:00 p.m. may not be returned until the following business day.  We are closed weekends and major holidays.  You do have access to a nurse 24-7, just call the main number to the clinic [phone] and do not press any options, hold on the line and a nurse will answer the phone.    For prescription refill requests, have your pharmacy contact our office and allow  72 hours.    Due to Covid, you will need to wear a mask upon entering the hospital. If you do not have a mask, a mask will be given to you at the Main Entrance upon arrival. For doctor visits, patients may have 1 support person age 69 or older with them. For treatment visits, patients can not have anyone with them due to social distancing guidelines and our immunocompromised population.

## 2024-07-26 DIAGNOSIS — G6289 Other specified polyneuropathies: Secondary | ICD-10-CM | POA: Diagnosis not present

## 2024-07-26 DIAGNOSIS — R29898 Other symptoms and signs involving the musculoskeletal system: Secondary | ICD-10-CM | POA: Diagnosis not present

## 2024-07-26 DIAGNOSIS — R531 Weakness: Secondary | ICD-10-CM | POA: Diagnosis not present

## 2024-07-28 ENCOUNTER — Inpatient Hospital Stay

## 2024-07-28 VITALS — BP 119/72 | HR 65 | Temp 97.6°F | Resp 18 | Wt 231.0 lb

## 2024-07-28 DIAGNOSIS — D509 Iron deficiency anemia, unspecified: Secondary | ICD-10-CM | POA: Diagnosis not present

## 2024-07-28 DIAGNOSIS — Z79899 Other long term (current) drug therapy: Secondary | ICD-10-CM | POA: Diagnosis not present

## 2024-07-28 DIAGNOSIS — D500 Iron deficiency anemia secondary to blood loss (chronic): Secondary | ICD-10-CM

## 2024-07-28 MED ORDER — METHYLPREDNISOLONE SODIUM SUCC 125 MG IJ SOLR
125.0000 mg | Freq: Once | INTRAMUSCULAR | Status: AC
  Administered 2024-07-28: 125 mg via INTRAVENOUS
  Filled 2024-07-28: qty 2

## 2024-07-28 MED ORDER — FAMOTIDINE IN NACL 20-0.9 MG/50ML-% IV SOLN
20.0000 mg | Freq: Once | INTRAVENOUS | Status: AC
  Administered 2024-07-28: 20 mg via INTRAVENOUS
  Filled 2024-07-28: qty 50

## 2024-07-28 MED ORDER — CETIRIZINE HCL 10 MG/ML IV SOLN
10.0000 mg | Freq: Once | INTRAVENOUS | Status: AC
  Administered 2024-07-28: 10 mg via INTRAVENOUS
  Filled 2024-07-28: qty 1

## 2024-07-28 MED ORDER — SODIUM CHLORIDE 0.9 % IV SOLN
1000.0000 mg | Freq: Once | INTRAVENOUS | Status: AC
  Administered 2024-07-28: 1000 mg via INTRAVENOUS
  Filled 2024-07-28: qty 1000

## 2024-07-28 MED ORDER — SODIUM CHLORIDE 0.9 % IV SOLN: Freq: Once | INTRAVENOUS | Status: AC

## 2024-07-28 NOTE — Progress Notes (Signed)
 Patient tolerated iron infusion with no complaints voiced.  Peripheral IV site clean and dry with good blood return noted before and after infusion.  Band aid applied.  VSS with discharge and left in satisfactory condition with no s/s of distress noted.

## 2024-07-28 NOTE — Patient Instructions (Signed)

## 2024-07-29 DIAGNOSIS — E1165 Type 2 diabetes mellitus with hyperglycemia: Secondary | ICD-10-CM | POA: Diagnosis not present

## 2024-07-29 DIAGNOSIS — E782 Mixed hyperlipidemia: Secondary | ICD-10-CM | POA: Diagnosis not present

## 2024-07-29 DIAGNOSIS — I10 Essential (primary) hypertension: Secondary | ICD-10-CM | POA: Diagnosis not present

## 2024-08-03 DIAGNOSIS — R531 Weakness: Secondary | ICD-10-CM | POA: Diagnosis not present

## 2024-08-03 DIAGNOSIS — G6289 Other specified polyneuropathies: Secondary | ICD-10-CM | POA: Diagnosis not present

## 2024-08-03 DIAGNOSIS — R29898 Other symptoms and signs involving the musculoskeletal system: Secondary | ICD-10-CM | POA: Diagnosis not present

## 2024-08-05 DIAGNOSIS — R29898 Other symptoms and signs involving the musculoskeletal system: Secondary | ICD-10-CM | POA: Diagnosis not present

## 2024-08-05 DIAGNOSIS — G6289 Other specified polyneuropathies: Secondary | ICD-10-CM | POA: Diagnosis not present

## 2024-08-05 DIAGNOSIS — R531 Weakness: Secondary | ICD-10-CM | POA: Diagnosis not present

## 2024-08-12 DIAGNOSIS — G6289 Other specified polyneuropathies: Secondary | ICD-10-CM | POA: Diagnosis not present

## 2024-08-12 DIAGNOSIS — R531 Weakness: Secondary | ICD-10-CM | POA: Diagnosis not present

## 2024-08-12 DIAGNOSIS — R29898 Other symptoms and signs involving the musculoskeletal system: Secondary | ICD-10-CM | POA: Diagnosis not present

## 2024-08-18 DIAGNOSIS — R531 Weakness: Secondary | ICD-10-CM | POA: Diagnosis not present

## 2024-08-18 DIAGNOSIS — G6289 Other specified polyneuropathies: Secondary | ICD-10-CM | POA: Diagnosis not present

## 2024-08-18 DIAGNOSIS — R29898 Other symptoms and signs involving the musculoskeletal system: Secondary | ICD-10-CM | POA: Diagnosis not present

## 2024-08-20 DIAGNOSIS — R29898 Other symptoms and signs involving the musculoskeletal system: Secondary | ICD-10-CM | POA: Diagnosis not present

## 2024-08-20 DIAGNOSIS — R531 Weakness: Secondary | ICD-10-CM | POA: Diagnosis not present

## 2024-08-20 DIAGNOSIS — G6289 Other specified polyneuropathies: Secondary | ICD-10-CM | POA: Diagnosis not present

## 2024-08-25 DIAGNOSIS — R531 Weakness: Secondary | ICD-10-CM | POA: Diagnosis not present

## 2024-08-25 DIAGNOSIS — G6289 Other specified polyneuropathies: Secondary | ICD-10-CM | POA: Diagnosis not present

## 2024-08-25 DIAGNOSIS — R29898 Other symptoms and signs involving the musculoskeletal system: Secondary | ICD-10-CM | POA: Diagnosis not present

## 2024-08-27 DIAGNOSIS — E782 Mixed hyperlipidemia: Secondary | ICD-10-CM | POA: Diagnosis not present

## 2024-08-27 DIAGNOSIS — I10 Essential (primary) hypertension: Secondary | ICD-10-CM | POA: Diagnosis not present

## 2024-08-27 DIAGNOSIS — E1165 Type 2 diabetes mellitus with hyperglycemia: Secondary | ICD-10-CM | POA: Diagnosis not present

## 2024-08-31 DIAGNOSIS — R29898 Other symptoms and signs involving the musculoskeletal system: Secondary | ICD-10-CM | POA: Diagnosis not present

## 2024-08-31 DIAGNOSIS — R531 Weakness: Secondary | ICD-10-CM | POA: Diagnosis not present

## 2024-08-31 DIAGNOSIS — G6289 Other specified polyneuropathies: Secondary | ICD-10-CM | POA: Diagnosis not present

## 2024-09-01 DIAGNOSIS — E1142 Type 2 diabetes mellitus with diabetic polyneuropathy: Secondary | ICD-10-CM | POA: Diagnosis not present

## 2024-09-01 DIAGNOSIS — B351 Tinea unguium: Secondary | ICD-10-CM | POA: Diagnosis not present

## 2024-09-01 DIAGNOSIS — L84 Corns and callosities: Secondary | ICD-10-CM | POA: Diagnosis not present

## 2024-09-01 DIAGNOSIS — M79674 Pain in right toe(s): Secondary | ICD-10-CM | POA: Diagnosis not present

## 2024-09-01 DIAGNOSIS — M79675 Pain in left toe(s): Secondary | ICD-10-CM | POA: Diagnosis not present

## 2024-09-02 DIAGNOSIS — H26492 Other secondary cataract, left eye: Secondary | ICD-10-CM | POA: Diagnosis not present

## 2024-09-02 DIAGNOSIS — R531 Weakness: Secondary | ICD-10-CM | POA: Diagnosis not present

## 2024-09-02 DIAGNOSIS — H40013 Open angle with borderline findings, low risk, bilateral: Secondary | ICD-10-CM | POA: Diagnosis not present

## 2024-09-02 DIAGNOSIS — H34832 Tributary (branch) retinal vein occlusion, left eye, with macular edema: Secondary | ICD-10-CM | POA: Diagnosis not present

## 2024-09-02 DIAGNOSIS — R29898 Other symptoms and signs involving the musculoskeletal system: Secondary | ICD-10-CM | POA: Diagnosis not present

## 2024-09-02 DIAGNOSIS — G6289 Other specified polyneuropathies: Secondary | ICD-10-CM | POA: Diagnosis not present

## 2024-09-07 DIAGNOSIS — G6289 Other specified polyneuropathies: Secondary | ICD-10-CM | POA: Diagnosis not present

## 2024-09-07 DIAGNOSIS — R29898 Other symptoms and signs involving the musculoskeletal system: Secondary | ICD-10-CM | POA: Diagnosis not present

## 2024-09-07 DIAGNOSIS — R531 Weakness: Secondary | ICD-10-CM | POA: Diagnosis not present

## 2024-09-09 DIAGNOSIS — R531 Weakness: Secondary | ICD-10-CM | POA: Diagnosis not present

## 2024-09-09 DIAGNOSIS — R29898 Other symptoms and signs involving the musculoskeletal system: Secondary | ICD-10-CM | POA: Diagnosis not present

## 2024-09-09 DIAGNOSIS — G6289 Other specified polyneuropathies: Secondary | ICD-10-CM | POA: Diagnosis not present

## 2024-09-09 DIAGNOSIS — H26492 Other secondary cataract, left eye: Secondary | ICD-10-CM | POA: Diagnosis not present

## 2024-09-13 DIAGNOSIS — G6289 Other specified polyneuropathies: Secondary | ICD-10-CM | POA: Diagnosis not present

## 2024-09-13 DIAGNOSIS — R531 Weakness: Secondary | ICD-10-CM | POA: Diagnosis not present

## 2024-09-13 DIAGNOSIS — R29898 Other symptoms and signs involving the musculoskeletal system: Secondary | ICD-10-CM | POA: Diagnosis not present

## 2024-09-15 DIAGNOSIS — R531 Weakness: Secondary | ICD-10-CM | POA: Diagnosis not present

## 2024-09-15 DIAGNOSIS — R29898 Other symptoms and signs involving the musculoskeletal system: Secondary | ICD-10-CM | POA: Diagnosis not present

## 2024-09-15 DIAGNOSIS — G6289 Other specified polyneuropathies: Secondary | ICD-10-CM | POA: Diagnosis not present

## 2024-09-20 DIAGNOSIS — G6289 Other specified polyneuropathies: Secondary | ICD-10-CM | POA: Diagnosis not present

## 2024-09-20 DIAGNOSIS — R531 Weakness: Secondary | ICD-10-CM | POA: Diagnosis not present

## 2024-09-20 DIAGNOSIS — R29898 Other symptoms and signs involving the musculoskeletal system: Secondary | ICD-10-CM | POA: Diagnosis not present

## 2024-09-23 DIAGNOSIS — R531 Weakness: Secondary | ICD-10-CM | POA: Diagnosis not present

## 2024-09-23 DIAGNOSIS — G6289 Other specified polyneuropathies: Secondary | ICD-10-CM | POA: Diagnosis not present

## 2024-09-23 DIAGNOSIS — R29898 Other symptoms and signs involving the musculoskeletal system: Secondary | ICD-10-CM | POA: Diagnosis not present

## 2024-09-28 DIAGNOSIS — E1165 Type 2 diabetes mellitus with hyperglycemia: Secondary | ICD-10-CM | POA: Diagnosis not present

## 2024-09-28 DIAGNOSIS — I10 Essential (primary) hypertension: Secondary | ICD-10-CM | POA: Diagnosis not present

## 2024-09-28 DIAGNOSIS — E782 Mixed hyperlipidemia: Secondary | ICD-10-CM | POA: Diagnosis not present

## 2024-09-29 DIAGNOSIS — R29898 Other symptoms and signs involving the musculoskeletal system: Secondary | ICD-10-CM | POA: Diagnosis not present

## 2024-09-29 DIAGNOSIS — G6289 Other specified polyneuropathies: Secondary | ICD-10-CM | POA: Diagnosis not present

## 2024-09-29 DIAGNOSIS — R531 Weakness: Secondary | ICD-10-CM | POA: Diagnosis not present

## 2024-10-07 DIAGNOSIS — E7849 Other hyperlipidemia: Secondary | ICD-10-CM | POA: Diagnosis not present

## 2024-10-07 DIAGNOSIS — Z1329 Encounter for screening for other suspected endocrine disorder: Secondary | ICD-10-CM | POA: Diagnosis not present

## 2024-10-07 DIAGNOSIS — E1165 Type 2 diabetes mellitus with hyperglycemia: Secondary | ICD-10-CM | POA: Diagnosis not present

## 2024-10-07 DIAGNOSIS — D649 Anemia, unspecified: Secondary | ICD-10-CM | POA: Diagnosis not present

## 2024-10-12 ENCOUNTER — Inpatient Hospital Stay: Attending: Physician Assistant

## 2024-10-12 ENCOUNTER — Ambulatory Visit (HOSPITAL_COMMUNITY)
Admission: RE | Admit: 2024-10-12 | Discharge: 2024-10-12 | Disposition: A | Discharge status: Home / Self Care | Source: Ambulatory Visit | Attending: Hematology | Admitting: Hematology

## 2024-10-12 ENCOUNTER — Other Ambulatory Visit: Payer: Self-pay | Admitting: Hematology

## 2024-10-12 DIAGNOSIS — D500 Iron deficiency anemia secondary to blood loss (chronic): Secondary | ICD-10-CM

## 2024-10-12 DIAGNOSIS — G939 Disorder of brain, unspecified: Secondary | ICD-10-CM | POA: Insufficient documentation

## 2024-10-12 DIAGNOSIS — D469 Myelodysplastic syndrome, unspecified: Secondary | ICD-10-CM | POA: Diagnosis not present

## 2024-10-12 DIAGNOSIS — R5383 Other fatigue: Secondary | ICD-10-CM | POA: Insufficient documentation

## 2024-10-12 DIAGNOSIS — Z85038 Personal history of other malignant neoplasm of large intestine: Secondary | ICD-10-CM | POA: Insufficient documentation

## 2024-10-12 DIAGNOSIS — G319 Degenerative disease of nervous system, unspecified: Secondary | ICD-10-CM | POA: Diagnosis not present

## 2024-10-12 DIAGNOSIS — D473 Essential (hemorrhagic) thrombocythemia: Secondary | ICD-10-CM | POA: Diagnosis not present

## 2024-10-12 DIAGNOSIS — D329 Benign neoplasm of meninges, unspecified: Secondary | ICD-10-CM | POA: Insufficient documentation

## 2024-10-12 DIAGNOSIS — Z79899 Other long term (current) drug therapy: Secondary | ICD-10-CM | POA: Insufficient documentation

## 2024-10-12 LAB — IRON AND TIBC
Iron: 130 ug/dL (ref 45–182)
Saturation Ratios: 39 % (ref 17.9–39.5)
TIBC: 337 ug/dL (ref 250–450)
UIBC: 207 ug/dL

## 2024-10-12 LAB — CBC WITH DIFFERENTIAL/PLATELET
Abs Immature Granulocytes: 0.4 K/uL — ABNORMAL HIGH (ref 0.00–0.07)
Basophils Absolute: 0.1 K/uL (ref 0.0–0.1)
Basophils Relative: 1 %
Eosinophils Absolute: 0 K/uL (ref 0.0–0.5)
Eosinophils Relative: 0 %
HCT: 38 % — ABNORMAL LOW (ref 39.0–52.0)
Hemoglobin: 12 g/dL — ABNORMAL LOW (ref 13.0–17.0)
Immature Granulocytes: 7 %
Lymphocytes Relative: 21 %
Lymphs Abs: 1.2 K/uL (ref 0.7–4.0)
MCH: 33 pg (ref 26.0–34.0)
MCHC: 31.6 g/dL (ref 30.0–36.0)
MCV: 104.4 fL — ABNORMAL HIGH (ref 80.0–100.0)
Monocytes Absolute: 0.8 K/uL (ref 0.1–1.0)
Monocytes Relative: 13 %
Neutro Abs: 3.3 K/uL (ref 1.7–7.7)
Neutrophils Relative %: 58 %
Platelets: 502 K/uL — ABNORMAL HIGH (ref 150–400)
RBC: 3.64 MIL/uL — ABNORMAL LOW (ref 4.22–5.81)
RDW: 19.2 % — ABNORMAL HIGH (ref 11.5–15.5)
WBC: 5.8 K/uL (ref 4.0–10.5)
nRBC: 1.2 % — ABNORMAL HIGH (ref 0.0–0.2)

## 2024-10-12 LAB — COMPREHENSIVE METABOLIC PANEL WITH GFR
ALT: 30 U/L (ref 0–44)
AST: 21 U/L (ref 15–41)
Albumin: 4.5 g/dL (ref 3.5–5.0)
Alkaline Phosphatase: 46 U/L (ref 38–126)
Anion gap: 9 (ref 5–15)
BUN: 12 mg/dL (ref 8–23)
CO2: 27 mmol/L (ref 22–32)
Calcium: 9.6 mg/dL (ref 8.9–10.3)
Chloride: 102 mmol/L (ref 98–111)
Creatinine, Ser: 1.04 mg/dL (ref 0.61–1.24)
GFR, Estimated: >60 mL/min (ref >60)
Glucose, Bld: 79 mg/dL (ref 70–99)
Potassium: 3.8 mmol/L (ref 3.5–5.1)
Sodium: 138 mmol/L (ref 135–145)
Total Bilirubin: 0.7 mg/dL (ref 0.0–1.2)
Total Protein: 6.4 g/dL — ABNORMAL LOW (ref 6.5–8.1)

## 2024-10-12 LAB — FERRITIN: Ferritin: 741 ng/mL — ABNORMAL HIGH (ref 24–336)

## 2024-10-12 LAB — LACTATE DEHYDROGENASE: LDH: 285 U/L — ABNORMAL HIGH (ref 98–192)

## 2024-10-13 ENCOUNTER — Encounter (INDEPENDENT_AMBULATORY_CARE_PROVIDER_SITE_OTHER): Payer: Self-pay | Admitting: Gastroenterology

## 2024-10-14 DIAGNOSIS — Z6831 Body mass index (BMI) 31.0-31.9, adult: Secondary | ICD-10-CM | POA: Diagnosis not present

## 2024-10-14 DIAGNOSIS — Z1389 Encounter for screening for other disorder: Secondary | ICD-10-CM | POA: Diagnosis not present

## 2024-10-14 DIAGNOSIS — Z0001 Encounter for general adult medical examination with abnormal findings: Secondary | ICD-10-CM | POA: Diagnosis not present

## 2024-10-14 DIAGNOSIS — E782 Mixed hyperlipidemia: Secondary | ICD-10-CM | POA: Diagnosis not present

## 2024-10-14 DIAGNOSIS — D473 Essential (hemorrhagic) thrombocythemia: Secondary | ICD-10-CM | POA: Diagnosis not present

## 2024-10-14 DIAGNOSIS — Z1331 Encounter for screening for depression: Secondary | ICD-10-CM | POA: Diagnosis not present

## 2024-10-14 DIAGNOSIS — I10 Essential (primary) hypertension: Secondary | ICD-10-CM | POA: Diagnosis not present

## 2024-10-14 DIAGNOSIS — E1165 Type 2 diabetes mellitus with hyperglycemia: Secondary | ICD-10-CM | POA: Diagnosis not present

## 2024-10-14 DIAGNOSIS — Z23 Encounter for immunization: Secondary | ICD-10-CM | POA: Diagnosis not present

## 2024-10-19 ENCOUNTER — Inpatient Hospital Stay: Admitting: Oncology

## 2024-10-19 VITALS — BP 114/76 | HR 75 | Temp 97.8°F | Resp 18 | Ht 70.0 in | Wt 217.0 lb

## 2024-10-19 DIAGNOSIS — D539 Nutritional anemia, unspecified: Secondary | ICD-10-CM

## 2024-10-19 DIAGNOSIS — D473 Essential (hemorrhagic) thrombocythemia: Secondary | ICD-10-CM

## 2024-10-19 DIAGNOSIS — Z79899 Other long term (current) drug therapy: Secondary | ICD-10-CM | POA: Diagnosis not present

## 2024-10-19 DIAGNOSIS — D469 Myelodysplastic syndrome, unspecified: Secondary | ICD-10-CM | POA: Diagnosis not present

## 2024-10-19 DIAGNOSIS — G939 Disorder of brain, unspecified: Secondary | ICD-10-CM | POA: Diagnosis not present

## 2024-10-19 DIAGNOSIS — Z85038 Personal history of other malignant neoplasm of large intestine: Secondary | ICD-10-CM | POA: Diagnosis not present

## 2024-10-19 DIAGNOSIS — R5383 Other fatigue: Secondary | ICD-10-CM | POA: Diagnosis not present

## 2024-10-19 NOTE — Progress Notes (Signed)
 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 male was transferred to my care after his prior physician has left.   ASSESSMENT & PLAN:   Assessment & Plan: Justin Berger  is a 73 y.o. male with ET and MDS  Assessment and Plan Assessment & Plan CALR positive essential thrombocytosis Hematology history below Platelet count slightly elevated at 502.  Hydroxyurea  1000 mg daily maintained due to intolerance of higher doses.  -Labs reviewed from 10/12/2024: CMP: WNL, LDH: 285, CBC: WBC: 5.8, hemoglobin: 12.0, platelets: 502 -Continue hydroxyurea  1000 mg daily. -Continue aspirin 81 mg daily -Will go up on the doses of hydroxyurea  slightly if platelets continue to trend up.  Return to clinic in 2 months with labs  Myelodysplastic syndrome, low risk Hematology history below.  IPSS R score of 1, very low risk Low risk with median overall survival of 8.8 years.   -Patient does not meet criteria for treatment at this time. - If he becomes anemic with hemoglobin less than 8, transfusion dependent, can consider luspatercept  at that time.  Fatigue Experiences afternoon fatigue without systemic symptoms.  - Monitor fatigue symptoms and ensure safety to prevent falls.  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  -No further follow-up required at this time  Normocytic anemia Patient was previously iron deficient and vitamin B12 and folate deficient. Iron panel reviewed today: Ferritin: 741, TSAT: 39 Mild anemia likely secondary to myelosuppression from hydroxyurea   - Continue to monitor for now.  The  patient understands the plans discussed today and is in agreement with them.  He knows to contact our office if he develops concerns prior to his next appointment.  40 minutes of total time was spent for this patient encounter, including preparation,review of records,  face-to-face counseling with the patient and coordination of care, physical exam, and documentation of the encounter.    Justin Berger,acting as a neurosurgeon for Mickiel Dry, MD.,have documented all relevant documentation on the behalf of Mickiel Dry, MD,as directed by  Mickiel Dry, MD while in the presence of Mickiel Dry, MD.  I, Mickiel Dry MD, have reviewed the above documentation for accuracy and completeness, and I agree with the above.    Mickiel Dry, MD  [facility] - A DEPT OF [facility] [address] [place] [address] Dept: [phone] Dept Fax: [phone]   Orders Placed This Encounter  Procedures   CBC with Differential    Standing Status:   Future    Expected Date:   12/14/2024    Expiration Date:   03/14/2025   Comprehensive metabolic panel    Standing Status:   Future    Expected Date:   12/14/2024    Expiration Date:   03/14/2025   Lactate dehydrogenase    Standing Status:   Future    Expected Date:   12/14/2024    Expiration Date:   03/14/2025     ONCOLOGY HISTORY:   I have reviewed his chart and materials related to his cancer extensively and collaborated history with the patient. Summary of oncologic history is as follows:   Diagnosis: CALR  positive essential thrombocytosis   -High risk disease with history of thrombosis (left leg DVT) and age > 71 -09/29/2017: CT AP: Splenomegaly, measuring 14.5 cm. -11/10/2017: JAK2 V617F negative. -11/19/2017: MPL mutation negative.   -12/10/2017: Bone Marrow Biopsy:  Pathology: Myeloproliferative neoplasm, increased reticulin (Grade I) consistent with  essential thrombocythemia.   Flow cytometry: No diagnostic abnormality detected.  Chromosome Analysis: normal male karyotype -Positive Symptoms: drenching night sweats, intermittent cyanosis of bilateral palms, and intermittent blurry vision -11/2017-current: Hydroxyurea  1000 mg daily -11/02/2021: US  Abdomen: Splenomegaly, measuring 14.3 cm and volume of 709 mL.  -11/20/2021: MRI abdomen: No splenomegaly.  -01/29/2024: MRI Brain: No acute intracranial process. 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   Pathology: Hypercellular bone marrow with a myeloid neoplasm. Increase in blastic cells is not identified. No significant CD34 positive blastic population identified. No monoclonal B-cell population or significant T-cell abnormalities identified.  Chromosome Analysis: Normal male karyotype  MDS FISH Panel: Normal -03/31/2023: Serum EPO: 112.8 -03/31/2023: NGS: Positive CALR, IDH 2, SF3B1   Diagnosis: History of Stage III colon cancer   -Patient reports diagnosis in 2004, s/p right hemicolectomy with chemotherapy (Leukovorin) and 5 FU -Colonoscopy every 5 years   Diagnosis: History of non-metastatic melanoma  -Patient reported history of right leg melanoma lesion, s/p surgical excision   Current Treatment:  Hydroxyurea  1000 mg daily  INTERVAL HISTORY:   Discussed the use of AI scribe software for clinical note transcription with the patient, who gave verbal consent to proceed.  History of Present Illness Justin Berger is a 73 year old male with essential thrombocythemia and myelodysplastic syndrome who presents for follow-up for his ET and MDS and to establish care with me  He experiences fatigue, particularly in the late afternoon, along with occasional chest pain that is musculoskeletal and headaches. No  fever, chills, night sweats, or itching after hot showers. His platelet count was noted to be slightly elevated at 502.  He has a history of essential thrombocythemia and myelodysplastic syndrome. A bone marrow biopsy in 2024 confirmed the presence of these conditions. He takes hydroxyurea , 1000 mg daily, divided into two tablets in the morning. He also takes aspirin daily.  He has a past medical history of colon cancer, treated 20 years ago, and has been cancer-free for five years. He also takes allopurinol  to prevent gout, as he has had several episodes in the past.   I have reviewed the past medical history, past surgical history, social history and family history with the patient and they are unchanged from previous note.  ALLERGIES:  is allergic to ivp dye  [iodinated contrast media] and gadavist  [gadobutrol ].  MEDICATIONS:  Current Outpatient Medications  Medication Sig Dispense Refill   ACCU-CHEK GUIDE test strip SMARTSIG:Strip(s) Via Meter Daily     Accu-Chek Softclix Lancets lancets USE TO TEST BLOOD SUGAR DAILY     acetaminophen (TYLENOL) 500 MG tablet Take 1,000 mg by mouth every 6 (six) hours as needed for moderate pain or headache.     allopurinol  (ZYLOPRIM ) 300 MG tablet Take 300 mg by mouth in the morning.     amoxicillin -clavulanate (AUGMENTIN ) 875-125 MG tablet Take 1 tablet by mouth 2 (two) times daily. 14 tablet 0   Ascorbic Acid (VITAMIN C PO) Take 2,000 mg by mouth daily as needed (during winter season).     aspirin 81 MG EC tablet  Take 81 mg by mouth in the morning.     atorvastatin  (LIPITOR) 10 MG tablet Take 10 mg by mouth every Sunday.     Blood Glucose Monitoring Suppl (ACCU-CHEK GUIDE ME) w/Device KIT USE TO TEST FASTING SUGAR ONCE DAILY     Cholecalciferol (VITAMIN D3 PO) Take 2 capsules by mouth in the morning.     clonazePAM  (KLONOPIN ) 0.5 MG tablet Take 0.5 mg by mouth daily as needed for anxiety.     DULoxetine (CYMBALTA) 60 MG capsule Take 60 mg by mouth in  the morning.     empagliflozin (JARDIANCE) 25 MG TABS tablet Take by mouth.     fluticasone (FLONASE) 50 MCG/ACT nasal spray Place 1 spray into both nostrils daily as needed for allergies or rhinitis.     gabapentin (NEURONTIN) 300 MG capsule Take 900 mg by mouth at bedtime.     glipiZIDE  (GLUCOTROL  XL) 10 MG 24 hr tablet Take 10 mg by mouth 2 (two) times daily.     hydroxyurea  (HYDREA ) 500 MG capsule TAKE 2 CAPSULES (1,000 MG TOTAL) BY MOUTH DAILY 180 capsule 1   loratadine (CLARITIN) 10 MG tablet Take 10 mg by mouth daily as needed for allergies.     metoprolol  succinate (TOPROL -XL) 100 MG 24 hr tablet Take 100 mg by mouth daily.     Multiple Vitamin (MULTIVITAMIN WITH MINERALS) TABS tablet Take 1 tablet by mouth in the morning.     omeprazole  (PRILOSEC) 40 MG capsule Take 1 capsule (40 mg total) by mouth daily. 90 capsule 3   sucralfate  (CARAFATE ) 1 g tablet Take 1 tablet (1 g total) by mouth 2 (two) times daily. 28 tablet 0   tadalafil (CIALIS) 20 MG tablet Take 20 mg by mouth daily as needed for erectile dysfunction.     zinc gluconate 50 MG tablet Take 50 mg by mouth daily as needed (during the winter season).     No current facility-administered medications for this visit.    REVIEW OF SYSTEMS:   Constitutional: Denies fevers, chills or abnormal weight loss Eyes: Denies blurriness of vision Ears, nose, mouth, throat, and face: Denies mucositis or sore throat Respiratory: Denies cough, dyspnea or wheezes Cardiovascular: Denies palpitation, or lower extremity swelling Gastrointestinal:  Denies nausea, heartburn or change in bowel habits Skin: Denies abnormal skin rashes Lymphatics: Denies new lymphadenopathy or easy bruising Neurological:Denies numbness, tingling or new weaknesses Behavioral/Psych: Mood is stable, no new changes  All other systems were reviewed with the patient and are negative.   VITALS:  Blood pressure 114/76, pulse 75, temperature 97.8 F (36.6 C),  temperature source Tympanic, resp. rate 18, height 5' 10 (1.778 m), weight 217 lb (98.4 kg), SpO2 99%.  Wt Readings from Last 3 Encounters:  10/19/24 217 lb (98.4 kg)  07/28/24 231 lb (104.8 kg)  07/20/24 226 lb 3.1 oz (102.6 kg)    Body mass index is 31.14 kg/m.  Performance status (ECOG): 2 - Symptomatic, <50% confined to bed  PHYSICAL EXAM:   GENERAL:alert, no distress and comfortable SKIN: skin color, texture, turgor are normal, no rashes or significant lesions LYMPH:  no palpable lymphadenopathy in the cervical, axillary or inguinal LUNGS: clear to auscultation and percussion with normal breathing effort HEART: regular rate & rhythm and no murmurs and no lower extremity edema ABDOMEN:abdomen soft, non-tender and normal bowel sounds Musculoskeletal:no cyanosis of digits and no clubbing  NEURO: alert & oriented x 3 with fluent speech, no focal motor/sensory deficits  LABORATORY DATA:  I have  reviewed the data as listed   Lab Results  Component Value Date   WBC 5.8 10/12/2024   NEUTROABS 3.3 10/12/2024   HGB 12.0 (L) 10/12/2024   HCT 38.0 (L) 10/12/2024   MCV 104.4 (H) 10/12/2024   PLT 502 (H) 10/12/2024      Chemistry      Component Value Date/Time   NA 138 10/12/2024 1310   K 3.8 10/12/2024 1310   CL 102 10/12/2024 1310   CO2 27 10/12/2024 1310   BUN 12 10/12/2024 1310   CREATININE 1.04 10/12/2024 1310      Component Value Date/Time   CALCIUM 9.6 10/12/2024 1310   ALKPHOS 46 10/12/2024 1310   AST 21 10/12/2024 1310   ALT 30 10/12/2024 1310   BILITOT 0.7 10/12/2024 1310       Latest Reference Range & Units 10/12/24 13:10  LDH 98 - 192 U/L 285 (H)  (H): Data is abnormally high   Latest Reference Range & Units 10/12/24 13:10  Iron 45 - 182 ug/dL 869  UIBC ug/dL 792  TIBC 749 - 549 ug/dL 662  Saturation Ratios 17.9 - 39.5 % 39  Ferritin 24 - 336 ng/mL 741 (H)  (H): Data is abnormally high  RADIOGRAPHIC STUDIES: I have personally reviewed the  radiological images as listed and agreed with the findings in the report.  MR BRAIN WO CONTRAST CLINICAL DATA:  Follow-up meningioma  EXAM: MRI HEAD WITHOUT CONTRAST  TECHNIQUE: Multiplanar, multiecho pulse sequences of the brain and surrounding structures were obtained without intravenous contrast.  COMPARISON:  January 29, 2024  FINDINGS: MRI brain:  There is mild atrophy.  There are a few foci of T2 hyperintensity in the cerebral white matter. These do not have restricted diffusion.  There is no acute or chronic infarct.  The ventricles are normal.  No mass lesion.  There are normal flow signals in the carotid arteries and basilar artery.  No significant bone marrow signal abnormality.  No significant abnormality in the paranasal sinuses or soft tissues.  IMPRESSION: There is mild atrophy.  No abnormal mass identified. The 9 mm dural-based abnormality noted on the prior study represents a venous structure.  No further follow-up recommended.  Electronically Signed   By: Nancyann Burns M.D.   On: 10/14/2024 09:54

## 2024-10-19 NOTE — Patient Instructions (Addendum)
 [facility] at [facility] Discharge Instructions   You were seen and examined today by Dr. Davonna.  She reviewed the results of your lab work which are normal/stable.   We will see you back in 2 months.   Return as scheduled.    Thank you for choosing [facility] at [facility] to provide your oncology and hematology care.  To afford each patient quality time with our provider, please arrive at least 15 minutes before your scheduled appointment time.   If you have a lab appointment with the Cancer Center please come in thru the Main Entrance and check in at the main information desk.  You need to re-schedule your appointment should you arrive 10 or more minutes late.  We strive to give you quality time with our providers, and arriving late affects you and other patients whose appointments are after yours.  Also, if you no show three or more times for appointments you may be dismissed from the clinic at the providers discretion.     Again, thank you for choosing [facility].  Our hope is that these requests will decrease the amount of time that you wait before being seen by our physicians.       _____________________________________________________________  Should you have questions after your visit to [facility], please contact our office at [phone] and follow the prompts.  Our office hours are 8:00 a.m. and 4:30 p.m. Monday - Friday.  Please note that voicemails left after 4:00 p.m. may not be returned until the following business day.  We are closed weekends and major holidays.  You do have access to a nurse 24-7, just call the main number to the clinic [phone] and do not press any options, hold on the line and a nurse will answer the phone.    For prescription refill requests, have your pharmacy contact our office and allow 72 hours.    Due to Covid, you will need to wear a mask upon entering the  hospital. If you do not have a mask, a mask will be given to you at the Main Entrance upon arrival. For doctor visits, patients may have 1 support person age 70 or older with them. For treatment visits, patients can not have anyone with them due to social distancing guidelines and our immunocompromised population.

## 2024-10-24 ENCOUNTER — Encounter: Payer: Self-pay | Admitting: Oncology

## 2024-10-29 DIAGNOSIS — E1165 Type 2 diabetes mellitus with hyperglycemia: Secondary | ICD-10-CM | POA: Diagnosis not present

## 2024-10-29 DIAGNOSIS — E782 Mixed hyperlipidemia: Secondary | ICD-10-CM | POA: Diagnosis not present

## 2024-10-29 DIAGNOSIS — I10 Essential (primary) hypertension: Secondary | ICD-10-CM | POA: Diagnosis not present

## 2024-11-10 DIAGNOSIS — Z961 Presence of intraocular lens: Secondary | ICD-10-CM | POA: Diagnosis not present

## 2024-11-10 DIAGNOSIS — H34832 Tributary (branch) retinal vein occlusion, left eye, with macular edema: Secondary | ICD-10-CM | POA: Diagnosis not present

## 2024-11-10 DIAGNOSIS — H40013 Open angle with borderline findings, low risk, bilateral: Secondary | ICD-10-CM | POA: Diagnosis not present

## 2024-11-10 DIAGNOSIS — E119 Type 2 diabetes mellitus without complications: Secondary | ICD-10-CM | POA: Diagnosis not present

## 2024-11-22 DIAGNOSIS — B351 Tinea unguium: Secondary | ICD-10-CM | POA: Diagnosis not present

## 2024-11-22 DIAGNOSIS — M79675 Pain in left toe(s): Secondary | ICD-10-CM | POA: Diagnosis not present

## 2024-11-22 DIAGNOSIS — M79674 Pain in right toe(s): Secondary | ICD-10-CM | POA: Diagnosis not present

## 2024-11-22 DIAGNOSIS — E1142 Type 2 diabetes mellitus with diabetic polyneuropathy: Secondary | ICD-10-CM | POA: Diagnosis not present

## 2024-11-22 DIAGNOSIS — L84 Corns and callosities: Secondary | ICD-10-CM | POA: Diagnosis not present

## 2024-11-26 DIAGNOSIS — E1165 Type 2 diabetes mellitus with hyperglycemia: Secondary | ICD-10-CM | POA: Diagnosis not present

## 2024-11-26 DIAGNOSIS — I10 Essential (primary) hypertension: Secondary | ICD-10-CM | POA: Diagnosis not present

## 2024-11-26 DIAGNOSIS — E782 Mixed hyperlipidemia: Secondary | ICD-10-CM | POA: Diagnosis not present

## 2024-12-21 ENCOUNTER — Inpatient Hospital Stay: Attending: Physician Assistant

## 2024-12-21 DIAGNOSIS — D473 Essential (hemorrhagic) thrombocythemia: Secondary | ICD-10-CM | POA: Diagnosis present

## 2024-12-21 DIAGNOSIS — D539 Nutritional anemia, unspecified: Secondary | ICD-10-CM

## 2024-12-21 LAB — COMPREHENSIVE METABOLIC PANEL WITH GFR
ALT: 29 U/L (ref 0–44)
AST: 21 U/L (ref 15–41)
Albumin: 4.6 g/dL (ref 3.5–5.0)
Alkaline Phosphatase: 49 U/L (ref 38–126)
Anion gap: 13 (ref 5–15)
BUN: 16 mg/dL (ref 8–23)
CO2: 24 mmol/L (ref 22–32)
Calcium: 9.4 mg/dL (ref 8.9–10.3)
Chloride: 104 mmol/L (ref 98–111)
Creatinine, Ser: 1.01 mg/dL (ref 0.61–1.24)
GFR, Estimated: >60 mL/min
Glucose, Bld: 106 mg/dL — ABNORMAL HIGH (ref 70–99)
Potassium: 4.1 mmol/L (ref 3.5–5.1)
Sodium: 141 mmol/L (ref 135–145)
Total Bilirubin: 0.7 mg/dL (ref 0.0–1.2)
Total Protein: 6.6 g/dL (ref 6.5–8.1)

## 2024-12-21 LAB — CBC WITH DIFFERENTIAL/PLATELET
Abs Immature Granulocytes: 0.3 K/uL — ABNORMAL HIGH (ref 0.00–0.07)
Basophils Absolute: 0.1 K/uL (ref 0.0–0.1)
Basophils Relative: 1 %
Eosinophils Absolute: 0 K/uL (ref 0.0–0.5)
Eosinophils Relative: 0 %
HCT: 36.9 % — ABNORMAL LOW (ref 39.0–52.0)
Hemoglobin: 11.6 g/dL — ABNORMAL LOW (ref 13.0–17.0)
Lymphocytes Relative: 22 %
Lymphs Abs: 1.6 K/uL (ref 0.7–4.0)
MCH: 32.9 pg (ref 26.0–34.0)
MCHC: 31.4 g/dL (ref 30.0–36.0)
MCV: 104.5 fL — ABNORMAL HIGH (ref 80.0–100.0)
Metamyelocytes Relative: 3 %
Monocytes Absolute: 0.9 K/uL (ref 0.1–1.0)
Monocytes Relative: 12 %
Myelocytes: 1 %
Neutro Abs: 4.3 K/uL (ref 1.7–7.7)
Neutrophils Relative %: 61 %
Platelets: 429 K/uL — ABNORMAL HIGH (ref 150–400)
RBC: 3.53 MIL/uL — ABNORMAL LOW (ref 4.22–5.81)
RDW: 19.1 % — ABNORMAL HIGH (ref 11.5–15.5)
Smear Review: NORMAL
WBC: 7.1 K/uL (ref 4.0–10.5)
nRBC: 0.7 % — ABNORMAL HIGH (ref 0.0–0.2)

## 2024-12-21 LAB — LACTATE DEHYDROGENASE: LDH: 288 U/L — ABNORMAL HIGH (ref 105–235)

## 2024-12-27 ENCOUNTER — Encounter: Payer: Self-pay | Admitting: *Deleted

## 2024-12-28 ENCOUNTER — Inpatient Hospital Stay: Admitting: Oncology

## 2025-01-03 ENCOUNTER — Inpatient Hospital Stay: Attending: Physician Assistant | Admitting: Oncology

## 2025-01-03 VITALS — BP 130/77 | HR 67 | Temp 98.1°F | Resp 18 | Wt 215.6 lb

## 2025-01-03 DIAGNOSIS — Z7982 Long term (current) use of aspirin: Secondary | ICD-10-CM | POA: Insufficient documentation

## 2025-01-03 DIAGNOSIS — G939 Disorder of brain, unspecified: Secondary | ICD-10-CM | POA: Diagnosis not present

## 2025-01-03 DIAGNOSIS — Z85038 Personal history of other malignant neoplasm of large intestine: Secondary | ICD-10-CM | POA: Insufficient documentation

## 2025-01-03 DIAGNOSIS — Z7984 Long term (current) use of oral hypoglycemic drugs: Secondary | ICD-10-CM | POA: Diagnosis not present

## 2025-01-03 DIAGNOSIS — D469 Myelodysplastic syndrome, unspecified: Secondary | ICD-10-CM | POA: Insufficient documentation

## 2025-01-03 DIAGNOSIS — E119 Type 2 diabetes mellitus without complications: Secondary | ICD-10-CM | POA: Insufficient documentation

## 2025-01-03 DIAGNOSIS — Z85820 Personal history of malignant melanoma of skin: Secondary | ICD-10-CM | POA: Insufficient documentation

## 2025-01-03 DIAGNOSIS — Z9221 Personal history of antineoplastic chemotherapy: Secondary | ICD-10-CM | POA: Diagnosis not present

## 2025-01-03 DIAGNOSIS — D473 Essential (hemorrhagic) thrombocythemia: Secondary | ICD-10-CM | POA: Insufficient documentation

## 2025-01-03 DIAGNOSIS — D539 Nutritional anemia, unspecified: Secondary | ICD-10-CM | POA: Diagnosis not present

## 2025-01-03 DIAGNOSIS — Z79899 Other long term (current) drug therapy: Secondary | ICD-10-CM | POA: Diagnosis not present

## 2025-01-03 NOTE — Addendum Note (Signed)
 Addended by: Neal Oshea on: 01/03/2025 04:27 PM   Modules accepted: Level of Service

## 2025-01-03 NOTE — Progress Notes (Signed)
 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 male with ET and MDS  Assessment and Plan Assessment & Plan CALR positive essential thrombocytosis Hematology history below Platelet count slightly elevated at 502.  Hydroxyurea  1000 mg daily maintained due to intolerance of higher doses.  -Labs reviewed from 10/12/2024: CMP: WNL, LDH: 288, CBC: hemoglobin: 11.6, platelets: 429, normal WBC -Continue hydroxyurea  1000 mg daily. -Continue aspirin 81 mg daily -Will go up on the doses of hydroxyurea  slightly if platelets continue to trend up or can go down if Hb continues to trend down.  Return to clinic in 2 months with labs  Myelodysplastic syndrome, low risk Hematology history below.  IPSS R score of 1, very low risk Low risk with median overall survival of 8.8 years.   -Patient does not meet criteria for treatment at this time. -If he becomes anemic with hemoglobin less than 8, transfusion dependent, can consider luspatercept  at that time.  Fatigue Experiences afternoon fatigue without systemic symptoms.  - Monitor fatigue symptoms and ensure safety to prevent falls.  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  -No further follow-up required at this time  Normocytic anemia Patient was previously iron deficient and vitamin B12 and folate deficient. Iron panel reviewed today: Ferritin: 741, TSAT: 39 Mild anemia likely secondary to myelosuppression from hydroxyurea   - Continue to monitor for now.  The patient understands the plans discussed today and  is in agreement with them.  He knows to contact our office if he develops concerns prior to his next appointment.  The total time spent in the appointment was 17 minutes for the encounter with patient, including review of chart and various tests results, discussions about plan of care and coordination of care plan   Mickiel Dry, MD  [facility] [place] - A DEPT OF [facility] [address] [place] [address] Dept: [phone] Dept Fax: [phone]   No orders of the defined types were placed in this encounter.    ONCOLOGY HISTORY:   I have reviewed his chart and materials related to his cancer extensively and collaborated history with the patient. Summary of oncologic history is as follows:   Diagnosis: CALR positive essential thrombocytosis   -High risk disease with history of thrombosis (left leg DVT) and age > 34 -09/29/2017: CT AP: Splenomegaly, measuring 14.5 cm. -11/10/2017: JAK2 V617F negative. -11/19/2017: MPL mutation negative.   -12/10/2017: Bone Marrow Biopsy:  Pathology: Myeloproliferative neoplasm, increased reticulin (Grade I) consistent with essential thrombocythemia.   Flow cytometry: No diagnostic abnormality detected.  Chromosome Analysis: normal male karyotype -Positive Symptoms: drenching night sweats, intermittent cyanosis of bilateral palms, and intermittent blurry vision -11/2017-current: Hydroxyurea  1000 mg daily -11/02/2021: US  Abdomen: Splenomegaly, measuring 14.3 cm and volume of 709 mL.  -11/20/2021: MRI abdomen: No splenomegaly.  -01/29/2024: MRI Brain: No acute intracranial process. 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   Pathology: Hypercellular bone marrow with  a myeloid neoplasm. Increase in blastic cells  is not identified. No significant CD34 positive blastic population identified. No monoclonal B-cell population or significant T-cell abnormalities identified.  Chromosome Analysis: Normal male karyotype  MDS FISH Panel: Normal -03/31/2023: Serum EPO: 112.8 -03/31/2023: NGS: Positive CALR, IDH 2, SF3B1   Diagnosis: History of Stage III colon cancer   -Patient reports diagnosis in 2004, s/p right hemicolectomy with chemotherapy (Leukovorin) and 5 FU -Colonoscopy every 5 years   Diagnosis: History of non-metastatic melanoma  -Patient reported history of right leg melanoma lesion, s/p surgical excision   Current Treatment:  Hydroxyurea  1000 mg daily  INTERVAL HISTORY:   Discussed the use of AI scribe software for clinical note transcription with the patient, who gave verbal consent to proceed.  History of Present Illness Justin Berger is a 74 year old male with ET and MDS who presents for oncology follow-up to monitor hematologic status and treatment-related symptoms. He is accompanied by his wife today.   He continues daily hydroxyurea  for essential thrombocytosis. Recent laboratory studies show improved platelet count (429) and mildly decreased hemoglobin (11.6 g/dL). Fatigue persists, primarily in the afternoons and evenings, and has remained stable since the last visit. He denies fevers, chills, or night sweats.  He experiences mild, intermittent chest pain described as just hurting without pressure, which is not bothersome. He maintains a regular exercise routine, working out two to three times per week to support balance.  He has persistent coldness in his hands and feet, which has worsened recently, with extremities sometimes feeling like ice despite warm environments. The cold sensation is intermittent but more pronounced.  He has easy bruising, attributed to thin skin and aging, without new or concerning rashes. His wife monitors his feet due to diabetes, noting small marks  that do not appear worrisome.  He uses Ozempic for diabetes management, resulting in improved glycemic control but significant gastrointestinal side effects, including early satiety and complete loss of taste, which further impairs oral intake. He expresses frustration with dysgeusia and difficulty eating.   I have reviewed the past medical history, past surgical history, social history and family history with the patient and they are unchanged from previous note.  ALLERGIES:  is allergic to ivp dye  [iodinated contrast media] and gadavist  [gadobutrol ].  MEDICATIONS:  Current Outpatient Medications  Medication Sig Dispense Refill   ACCU-CHEK GUIDE test strip SMARTSIG:Strip(s) Via Meter Daily     Accu-Chek Softclix Lancets lancets USE TO TEST BLOOD SUGAR DAILY     acetaminophen (TYLENOL) 500 MG tablet Take 1,000 mg by mouth every 6 (six) hours as needed for moderate pain or headache.     allopurinol (ZYLOPRIM) 300 MG tablet Take 300 mg by mouth in the morning.     amoxicillin -clavulanate (AUGMENTIN ) 875-125 MG tablet Take 1 tablet by mouth 2 (two) times daily. 14 tablet 0   Ascorbic Acid (VITAMIN C PO) Take 2,000 mg by mouth daily as needed (during winter season).     aspirin 81 MG EC tablet Take 81 mg by mouth in the morning.     atorvastatin (LIPITOR) 10 MG tablet Take 10 mg by mouth every Sunday.     Blood Glucose Monitoring Suppl (ACCU-CHEK GUIDE ME) w/Device KIT USE TO TEST FASTING SUGAR ONCE DAILY     Cholecalciferol (VITAMIN D3 PO) Take 2 capsules by mouth in the morning.     clonazePAM (KLONOPIN) 0.5 MG tablet Take 0.5 mg by mouth daily as needed for anxiety.     DULoxetine (CYMBALTA)  60 MG capsule Take 60 mg by mouth in the morning.     empagliflozin (JARDIANCE) 25 MG TABS tablet Take by mouth.     fluticasone (FLONASE) 50 MCG/ACT nasal spray Place 1 spray into both nostrils daily as needed for allergies or rhinitis.     gabapentin (NEURONTIN) 300 MG capsule Take 900 mg by mouth at  bedtime.     glipiZIDE (GLUCOTROL XL) 10 MG 24 hr tablet Take 10 mg by mouth 2 (two) times daily.     hydroxyurea  (HYDREA ) 500 MG capsule TAKE 2 CAPSULES (1,000 MG TOTAL) BY MOUTH DAILY 180 capsule 1   loratadine (CLARITIN) 10 MG tablet Take 10 mg by mouth daily as needed for allergies.     metoprolol succinate (TOPROL-XL) 100 MG 24 hr tablet Take 100 mg by mouth daily.     Multiple Vitamin (MULTIVITAMIN WITH MINERALS) TABS tablet Take 1 tablet by mouth in the morning.     omeprazole  (PRILOSEC) 40 MG capsule Take 1 capsule (40 mg total) by mouth daily. 90 capsule 3   sucralfate  (CARAFATE ) 1 g tablet Take 1 tablet (1 g total) by mouth 2 (two) times daily. 28 tablet 0   tadalafil (CIALIS) 20 MG tablet Take 20 mg by mouth daily as needed for erectile dysfunction.     zinc gluconate 50 MG tablet Take 50 mg by mouth daily as needed (during the winter season).     No current facility-administered medications for this visit.   VITALS:  There were no vitals taken for this visit.  Wt Readings from Last 3 Encounters:  10/19/24 217 lb (98.4 kg)  07/28/24 231 lb (104.8 kg)  07/20/24 226 lb 3.1 oz (102.6 kg)    There is no height or weight on file to calculate BMI.  Performance status (ECOG): 2 - Symptomatic, <50% confined to bed  PHYSICAL EXAM:   GENERAL:alert, no distress and comfortable SKIN: skin color, texture, turgor are normal, no rashes or significant lesions LYMPH:  no palpable lymphadenopathy in the cervical, axillary or inguinal LUNGS: clear to auscultation and percussion with normal breathing effort HEART: regular rate & rhythm and no murmurs and no lower extremity edema ABDOMEN:abdomen soft, non-tender and normal bowel sounds Musculoskeletal:no cyanosis of digits and no clubbing  NEURO: alert & oriented x 3 with fluent speech  LABORATORY DATA:  I have reviewed the data as listed   Lab Results  Component Value Date   WBC 7.1 12/21/2024   NEUTROABS 4.3 12/21/2024   HGB 11.6  (L) 12/21/2024   HCT 36.9 (L) 12/21/2024   MCV 104.5 (H) 12/21/2024   PLT 429 (H) 12/21/2024      Chemistry      Component Value Date/Time   NA 141 12/21/2024 1251   K 4.1 12/21/2024 1251   CL 104 12/21/2024 1251   CO2 24 12/21/2024 1251   BUN 16 12/21/2024 1251   CREATININE 1.01 12/21/2024 1251      Component Value Date/Time   CALCIUM 9.4 12/21/2024 1251   ALKPHOS 49 12/21/2024 1251   AST 21 12/21/2024 1251   ALT 29 12/21/2024 1251   BILITOT 0.7 12/21/2024 1251       Latest Reference Range & Units 12/21/24 12:51  LDH 105 - 235 U/L 288 (H)  (H): Data is abnormally high   Latest Reference Range & Units 10/12/24 13:10  Iron 45 - 182 ug/dL 869  UIBC ug/dL 792  TIBC 749 - 549 ug/dL 662  Saturation Ratios 17.9 - 39.5 %  39  Ferritin 24 - 336 ng/mL 741 (H)  (H): Data is abnormally high  RADIOGRAPHIC STUDIES: I have personally reviewed the radiological images as listed and agreed with the findings in the report  "

## 2025-03-23 ENCOUNTER — Inpatient Hospital Stay

## 2025-03-30 ENCOUNTER — Inpatient Hospital Stay: Admitting: Physician Assistant
